# Patient Record
Sex: Female | Born: 1935 | Race: Black or African American | Hispanic: No | Smoking: Never smoker
Health system: Southern US, Community
[De-identification: ages and names within clinical notes are randomized; demographics above are authoritative.]

## PROBLEM LIST (undated history)

## (undated) DIAGNOSIS — J309 Allergic rhinitis, unspecified: Secondary | ICD-10-CM

## (undated) DIAGNOSIS — K5730 Diverticulosis of large intestine without perforation or abscess without bleeding: Secondary | ICD-10-CM

## (undated) DIAGNOSIS — K31819 Angiodysplasia of stomach and duodenum without bleeding: Secondary | ICD-10-CM

## (undated) DIAGNOSIS — H269 Unspecified cataract: Secondary | ICD-10-CM

## (undated) DIAGNOSIS — K589 Irritable bowel syndrome without diarrhea: Secondary | ICD-10-CM

## (undated) DIAGNOSIS — M1990 Unspecified osteoarthritis, unspecified site: Secondary | ICD-10-CM

## (undated) DIAGNOSIS — Z860100 Personal history of colon polyps, unspecified: Secondary | ICD-10-CM

## (undated) DIAGNOSIS — K449 Diaphragmatic hernia without obstruction or gangrene: Secondary | ICD-10-CM

## (undated) DIAGNOSIS — K222 Esophageal obstruction: Secondary | ICD-10-CM

## (undated) DIAGNOSIS — IMO0001 Reserved for inherently not codable concepts without codable children: Secondary | ICD-10-CM

## (undated) DIAGNOSIS — Z8701 Personal history of pneumonia (recurrent): Secondary | ICD-10-CM

## (undated) DIAGNOSIS — J45909 Unspecified asthma, uncomplicated: Secondary | ICD-10-CM

## (undated) DIAGNOSIS — Z8739 Personal history of other diseases of the musculoskeletal system and connective tissue: Secondary | ICD-10-CM

## (undated) DIAGNOSIS — Z79890 Hormone replacement therapy (postmenopausal): Secondary | ICD-10-CM

## (undated) DIAGNOSIS — Z9621 Cochlear implant status: Secondary | ICD-10-CM

## (undated) DIAGNOSIS — H9190 Unspecified hearing loss, unspecified ear: Secondary | ICD-10-CM

## (undated) DIAGNOSIS — K219 Gastro-esophageal reflux disease without esophagitis: Secondary | ICD-10-CM

## (undated) DIAGNOSIS — Z8679 Personal history of other diseases of the circulatory system: Secondary | ICD-10-CM

## (undated) DIAGNOSIS — T7840XA Allergy, unspecified, initial encounter: Secondary | ICD-10-CM

## (undated) HISTORY — PX: ESOPHAGOGASTRODUODENOSCOPY: SHX1529

## (undated) HISTORY — DX: Diaphragmatic hernia without obstruction or gangrene: K44.9

## (undated) HISTORY — PX: TUBAL LIGATION: SHX77

## (undated) HISTORY — DX: Unspecified hearing loss, unspecified ear: H91.90

## (undated) HISTORY — PX: OTHER SURGICAL HISTORY: SHX169

## (undated) HISTORY — DX: Allergy, unspecified, initial encounter: T78.40XA

## (undated) HISTORY — DX: Personal history of colon polyps, unspecified: Z86.0100

## (undated) HISTORY — DX: Gastro-esophageal reflux disease without esophagitis: K21.9

## (undated) HISTORY — DX: Unspecified cataract: H26.9

## (undated) HISTORY — DX: Unspecified asthma, uncomplicated: J45.909

## (undated) HISTORY — DX: Allergic rhinitis, unspecified: J30.9

## (undated) HISTORY — DX: Irritable bowel syndrome, unspecified: K58.9

## (undated) HISTORY — DX: Unspecified osteoarthritis, unspecified site: M19.90

## (undated) HISTORY — DX: Esophageal obstruction: K22.2

## (undated) HISTORY — PX: ABDOMINAL HYSTERECTOMY: SHX81

## (undated) HISTORY — DX: Personal history of pneumonia (recurrent): Z87.01

## (undated) HISTORY — PX: POLYPECTOMY: SHX149

## (undated) HISTORY — DX: Personal history of other diseases of the circulatory system: Z86.79

## (undated) HISTORY — DX: Cochlear implant status: Z96.21

## (undated) HISTORY — DX: Angiodysplasia of stomach and duodenum without bleeding: K31.819

## (undated) HISTORY — DX: Reserved for inherently not codable concepts without codable children: IMO0001

## (undated) HISTORY — DX: Diverticulosis of large intestine without perforation or abscess without bleeding: K57.30

## (undated) HISTORY — DX: Personal history of other diseases of the musculoskeletal system and connective tissue: Z87.39

## (undated) HISTORY — PX: CHOLECYSTECTOMY: SHX55

## (undated) HISTORY — PX: VENTRICULAR ABLATION SURGERY: SHX835

## (undated) HISTORY — PX: EYE SURGERY: SHX253

## (undated) HISTORY — DX: Personal history of colonic polyps: Z86.010

## (undated) HISTORY — DX: Hormone replacement therapy: Z79.890

## (undated) HISTORY — PX: COCHLEAR IMPLANT: SUR684

## (undated) HISTORY — PX: APPENDECTOMY: SHX54

---

## 1998-04-28 ENCOUNTER — Ambulatory Visit (HOSPITAL_COMMUNITY): Admission: RE | Admit: 1998-04-28 | Discharge: 1998-04-28 | Payer: Self-pay | Admitting: Otolaryngology

## 1998-12-06 ENCOUNTER — Encounter: Payer: Self-pay | Admitting: Internal Medicine

## 1998-12-06 ENCOUNTER — Ambulatory Visit (HOSPITAL_COMMUNITY): Admission: RE | Admit: 1998-12-06 | Discharge: 1998-12-06 | Payer: Self-pay | Admitting: Internal Medicine

## 1998-12-18 ENCOUNTER — Other Ambulatory Visit: Admission: RE | Admit: 1998-12-18 | Discharge: 1998-12-18 | Payer: Self-pay | Admitting: Internal Medicine

## 2000-03-17 ENCOUNTER — Encounter (INDEPENDENT_AMBULATORY_CARE_PROVIDER_SITE_OTHER): Payer: Self-pay | Admitting: Specialist

## 2000-03-17 ENCOUNTER — Other Ambulatory Visit: Admission: RE | Admit: 2000-03-17 | Discharge: 2000-03-17 | Payer: Self-pay | Admitting: Internal Medicine

## 2000-07-19 ENCOUNTER — Ambulatory Visit (HOSPITAL_COMMUNITY): Admission: RE | Admit: 2000-07-19 | Discharge: 2000-07-19 | Payer: Self-pay | Admitting: Internal Medicine

## 2000-07-19 ENCOUNTER — Encounter: Payer: Self-pay | Admitting: Internal Medicine

## 2000-09-08 ENCOUNTER — Ambulatory Visit (HOSPITAL_COMMUNITY): Admission: RE | Admit: 2000-09-08 | Discharge: 2000-09-08 | Payer: Self-pay | Admitting: Internal Medicine

## 2000-09-08 ENCOUNTER — Encounter: Payer: Self-pay | Admitting: Internal Medicine

## 2000-11-08 ENCOUNTER — Encounter: Payer: Self-pay | Admitting: Internal Medicine

## 2000-11-08 ENCOUNTER — Ambulatory Visit (HOSPITAL_COMMUNITY): Admission: RE | Admit: 2000-11-08 | Discharge: 2000-11-08 | Payer: Self-pay | Admitting: Internal Medicine

## 2000-11-15 ENCOUNTER — Encounter: Payer: Self-pay | Admitting: Internal Medicine

## 2000-11-15 ENCOUNTER — Ambulatory Visit (HOSPITAL_COMMUNITY): Admission: RE | Admit: 2000-11-15 | Discharge: 2000-11-15 | Payer: Self-pay | Admitting: Internal Medicine

## 2001-06-06 ENCOUNTER — Encounter: Payer: Self-pay | Admitting: Internal Medicine

## 2001-06-06 ENCOUNTER — Ambulatory Visit (HOSPITAL_COMMUNITY): Admission: RE | Admit: 2001-06-06 | Discharge: 2001-06-06 | Payer: Self-pay | Admitting: Internal Medicine

## 2002-08-05 ENCOUNTER — Encounter: Payer: Self-pay | Admitting: Emergency Medicine

## 2002-08-05 ENCOUNTER — Emergency Department (HOSPITAL_COMMUNITY): Admission: EM | Admit: 2002-08-05 | Discharge: 2002-08-05 | Payer: Self-pay | Admitting: Emergency Medicine

## 2002-10-16 ENCOUNTER — Encounter: Payer: Self-pay | Admitting: Internal Medicine

## 2002-10-16 ENCOUNTER — Ambulatory Visit (HOSPITAL_COMMUNITY): Admission: RE | Admit: 2002-10-16 | Discharge: 2002-10-16 | Payer: Self-pay | Admitting: Internal Medicine

## 2003-05-08 ENCOUNTER — Encounter: Payer: Self-pay | Admitting: *Deleted

## 2003-05-08 ENCOUNTER — Emergency Department (HOSPITAL_COMMUNITY): Admission: EM | Admit: 2003-05-08 | Discharge: 2003-05-08 | Payer: Self-pay | Admitting: *Deleted

## 2003-07-02 ENCOUNTER — Encounter: Payer: Self-pay | Admitting: Internal Medicine

## 2003-07-02 ENCOUNTER — Ambulatory Visit (HOSPITAL_COMMUNITY): Admission: RE | Admit: 2003-07-02 | Discharge: 2003-07-02 | Payer: Self-pay | Admitting: Internal Medicine

## 2003-09-14 ENCOUNTER — Emergency Department (HOSPITAL_COMMUNITY): Admission: EM | Admit: 2003-09-14 | Discharge: 2003-09-14 | Payer: Self-pay | Admitting: Emergency Medicine

## 2004-03-13 ENCOUNTER — Emergency Department (HOSPITAL_COMMUNITY): Admission: EM | Admit: 2004-03-13 | Discharge: 2004-03-13 | Payer: Self-pay | Admitting: Emergency Medicine

## 2004-05-01 ENCOUNTER — Ambulatory Visit (HOSPITAL_COMMUNITY): Admission: RE | Admit: 2004-05-01 | Discharge: 2004-05-02 | Payer: Self-pay | Admitting: Internal Medicine

## 2004-09-15 ENCOUNTER — Ambulatory Visit: Payer: Self-pay | Admitting: Internal Medicine

## 2004-12-29 ENCOUNTER — Ambulatory Visit: Payer: Self-pay | Admitting: Internal Medicine

## 2004-12-31 ENCOUNTER — Ambulatory Visit: Payer: Self-pay | Admitting: Internal Medicine

## 2004-12-31 ENCOUNTER — Ambulatory Visit (HOSPITAL_COMMUNITY): Admission: RE | Admit: 2004-12-31 | Discharge: 2004-12-31 | Payer: Self-pay | Admitting: Internal Medicine

## 2005-04-27 ENCOUNTER — Ambulatory Visit: Payer: Self-pay | Admitting: Internal Medicine

## 2005-05-27 ENCOUNTER — Emergency Department (HOSPITAL_COMMUNITY): Admission: EM | Admit: 2005-05-27 | Discharge: 2005-05-27 | Payer: Self-pay | Admitting: Emergency Medicine

## 2005-06-06 ENCOUNTER — Ambulatory Visit: Payer: Self-pay | Admitting: Internal Medicine

## 2005-12-21 ENCOUNTER — Ambulatory Visit: Payer: Self-pay | Admitting: Internal Medicine

## 2005-12-31 ENCOUNTER — Ambulatory Visit: Payer: Self-pay | Admitting: Internal Medicine

## 2005-12-31 ENCOUNTER — Ambulatory Visit (HOSPITAL_COMMUNITY): Admission: RE | Admit: 2005-12-31 | Discharge: 2005-12-31 | Payer: Self-pay | Admitting: Internal Medicine

## 2006-05-08 ENCOUNTER — Ambulatory Visit: Payer: Self-pay | Admitting: Internal Medicine

## 2006-09-16 ENCOUNTER — Ambulatory Visit: Payer: Self-pay | Admitting: Internal Medicine

## 2006-11-23 ENCOUNTER — Ambulatory Visit: Payer: Self-pay | Admitting: Internal Medicine

## 2006-11-24 LAB — CONVERTED CEMR LAB
BUN: 13 mg/dL (ref 6–23)
CO2: 31 meq/L (ref 19–32)
Calcium: 9.3 mg/dL (ref 8.4–10.5)
Chloride: 109 meq/L (ref 96–112)
Cholesterol: 204 mg/dL (ref 0–200)
Creatinine, Ser: 1.1 mg/dL (ref 0.4–1.2)
Direct LDL: 125.4 mg/dL
GFR calc Af Amer: 63 mL/min
GFR calc non Af Amer: 52 mL/min
Glucose, Bld: 91 mg/dL (ref 70–99)
HDL: 55.3 mg/dL (ref >39.0)
Potassium: 4.4 meq/L (ref 3.5–5.1)
Sodium: 143 meq/L (ref 135–145)
Total CHOL/HDL Ratio: 3.7
Triglycerides: 62 mg/dL (ref 0–149)
VLDL: 12 mg/dL (ref 0–40)

## 2007-08-17 ENCOUNTER — Ambulatory Visit (HOSPITAL_COMMUNITY): Admission: RE | Admit: 2007-08-17 | Discharge: 2007-08-17 | Payer: Self-pay | Admitting: Internal Medicine

## 2007-08-22 ENCOUNTER — Ambulatory Visit: Payer: Self-pay | Admitting: Internal Medicine

## 2007-09-27 ENCOUNTER — Encounter: Payer: Self-pay | Admitting: Internal Medicine

## 2007-12-23 ENCOUNTER — Encounter: Payer: Self-pay | Admitting: Internal Medicine

## 2007-12-30 ENCOUNTER — Encounter: Payer: Self-pay | Admitting: *Deleted

## 2007-12-30 DIAGNOSIS — J189 Pneumonia, unspecified organism: Secondary | ICD-10-CM | POA: Insufficient documentation

## 2007-12-30 DIAGNOSIS — Z9089 Acquired absence of other organs: Secondary | ICD-10-CM

## 2007-12-30 DIAGNOSIS — K319 Disease of stomach and duodenum, unspecified: Secondary | ICD-10-CM | POA: Insufficient documentation

## 2007-12-30 DIAGNOSIS — M67919 Unspecified disorder of synovium and tendon, unspecified shoulder: Secondary | ICD-10-CM | POA: Insufficient documentation

## 2007-12-30 DIAGNOSIS — M719 Bursopathy, unspecified: Secondary | ICD-10-CM

## 2007-12-30 DIAGNOSIS — Z9189 Other specified personal risk factors, not elsewhere classified: Secondary | ICD-10-CM | POA: Insufficient documentation

## 2007-12-30 DIAGNOSIS — K219 Gastro-esophageal reflux disease without esophagitis: Secondary | ICD-10-CM | POA: Insufficient documentation

## 2007-12-30 DIAGNOSIS — I471 Supraventricular tachycardia, unspecified: Secondary | ICD-10-CM | POA: Insufficient documentation

## 2007-12-30 DIAGNOSIS — Z9621 Cochlear implant status: Secondary | ICD-10-CM | POA: Insufficient documentation

## 2008-02-04 ENCOUNTER — Ambulatory Visit: Payer: Self-pay | Admitting: Family Medicine

## 2008-03-19 ENCOUNTER — Ambulatory Visit: Payer: Self-pay | Admitting: Internal Medicine

## 2008-03-19 LAB — CONVERTED CEMR LAB
ALT: 18 units/L (ref 0–35)
AST: 26 units/L (ref 0–37)
BUN: 8 mg/dL (ref 6–23)
Basophils Absolute: 0.2 10*3/uL — ABNORMAL HIGH (ref 0.0–0.1)
CO2: 31 meq/L (ref 19–32)
Creatinine, Ser: 0.9 mg/dL (ref 0.4–1.2)
Eosinophils Relative: 2.5 % (ref 0.0–5.0)
Folate: 11.6 ng/mL
GFR calc Af Amer: 80 mL/min
GFR calc non Af Amer: 66 mL/min
Hemoglobin: 13.8 g/dL (ref 12.0–15.0)
MCHC: 32.3 g/dL (ref 30.0–36.0)
Neutrophils Relative %: 54.3 % (ref 43.0–77.0)
Platelets: 260 10*3/uL (ref 150–400)
RDW: 13.5 % (ref 11.5–14.6)
Sodium: 143 meq/L (ref 135–145)
Total Bilirubin: 1.2 mg/dL (ref 0.3–1.2)

## 2008-03-20 ENCOUNTER — Encounter: Payer: Self-pay | Admitting: Internal Medicine

## 2008-06-13 ENCOUNTER — Ambulatory Visit: Payer: Self-pay | Admitting: Internal Medicine

## 2008-06-28 ENCOUNTER — Encounter: Payer: Self-pay | Admitting: Internal Medicine

## 2008-06-28 ENCOUNTER — Ambulatory Visit: Payer: Self-pay | Admitting: Internal Medicine

## 2008-06-29 ENCOUNTER — Encounter: Payer: Self-pay | Admitting: Internal Medicine

## 2008-10-17 ENCOUNTER — Ambulatory Visit: Payer: Self-pay | Admitting: Internal Medicine

## 2009-02-25 ENCOUNTER — Ambulatory Visit: Payer: Self-pay | Admitting: Endocrinology

## 2009-03-20 ENCOUNTER — Encounter: Payer: Self-pay | Admitting: Internal Medicine

## 2009-05-20 ENCOUNTER — Encounter: Payer: Self-pay | Admitting: Internal Medicine

## 2009-05-24 ENCOUNTER — Encounter: Payer: Self-pay | Admitting: Internal Medicine

## 2009-10-09 ENCOUNTER — Telehealth: Payer: Self-pay | Admitting: Internal Medicine

## 2009-10-09 ENCOUNTER — Ambulatory Visit: Payer: Self-pay | Admitting: Internal Medicine

## 2009-10-09 ENCOUNTER — Encounter: Payer: Self-pay | Admitting: Internal Medicine

## 2009-10-09 DIAGNOSIS — M8580 Other specified disorders of bone density and structure, unspecified site: Secondary | ICD-10-CM | POA: Insufficient documentation

## 2009-10-09 DIAGNOSIS — M949 Disorder of cartilage, unspecified: Secondary | ICD-10-CM

## 2009-10-09 DIAGNOSIS — M899 Disorder of bone, unspecified: Secondary | ICD-10-CM

## 2009-10-09 DIAGNOSIS — M545 Low back pain: Secondary | ICD-10-CM

## 2009-10-09 DIAGNOSIS — R82998 Other abnormal findings in urine: Secondary | ICD-10-CM

## 2009-10-09 LAB — CONVERTED CEMR LAB
Protein, U semiquant: NEGATIVE
pH: 7.5

## 2009-10-11 ENCOUNTER — Ambulatory Visit: Payer: Self-pay | Admitting: Internal Medicine

## 2009-10-11 ENCOUNTER — Encounter: Payer: Self-pay | Admitting: Internal Medicine

## 2009-11-04 ENCOUNTER — Ambulatory Visit: Payer: Self-pay | Admitting: Internal Medicine

## 2010-02-11 ENCOUNTER — Ambulatory Visit: Payer: Self-pay | Admitting: Internal Medicine

## 2010-02-11 ENCOUNTER — Inpatient Hospital Stay (HOSPITAL_COMMUNITY): Admission: EM | Admit: 2010-02-11 | Discharge: 2010-02-13 | Payer: Self-pay | Admitting: Emergency Medicine

## 2010-05-13 ENCOUNTER — Encounter: Payer: Self-pay | Admitting: Internal Medicine

## 2010-08-26 ENCOUNTER — Ambulatory Visit: Payer: Self-pay | Admitting: Internal Medicine

## 2010-08-26 ENCOUNTER — Encounter: Payer: Self-pay | Admitting: Internal Medicine

## 2010-12-11 NOTE — Assessment & Plan Note (Signed)
Summary: SLIGHT CHEST PAIN,PAIN IN RIGHT SHOULDER/MUSCLE PULLING IN RI...   Vital Signs:  Patient profile:   76 year old female Height:      65 inches Weight:      182 pounds BMI:     30.40 O2 Sat:      97 % on Room air Temp:     97.6 degrees F oral Pulse rate:   67 / minute BP sitting:   120 / 74  (left arm) Cuff size:   large  Vitals Entered By: Bill Salinas CMA (August 26, 2010 4:34 PM)  O2 Flow:  Room air CC: pt here with c/o slight chest pain with soreness more so towards her right side/ab   Primary Care Provider:  Jacques Navy MD  CC:  pt here with c/o slight chest pain with soreness more so towards her right side/ab.  History of Present Illness: Patient presents with several day h/o left anterior chest pain that is non-exertional. She describes a sharp pin-sticking type pain that comes and goes. No radiation to the jaw or arm. She has not had any diaphoresis, no shortness of breath. The pain does not keep her up at night. She dos have acid reflux  but this feels a little different.  Cardiac risk includes age-post menopausal, weight. Not a smoker, no hight cholesterol, no diabetes.   Current Medications (verified): 1)  Protonix 40 Mg  Tbec (Pantoprazole Sodium) .Marland Kitchen.. 1 By Mouth Once Daily 2)  Singulair 10 Mg  Tabs (Montelukast Sodium) .... Take One Tablet Once Daily 3)  Alprazolam 0.5 Mg  Tb24 (Alprazolam) .... Take One Tablet Once Daily As Needed 4)  Vesicare 10 Mg  Tabs (Solifenacin Succinate) .... Once Daily 5)  Advair Diskus 100-50 Mcg/dose  Misc (Fluticasone-Salmeterol) .Marland Kitchen.. 1 Inhalation Two Times A Day 6)  Astepro 137 Mcg/spray Soln (Azelastine Hcl) .... As Directed By Allergist 7)  Meloxicam 15 Mg Tabs (Meloxicam) .Marland Kitchen.. 1 By Mouth Once Daily  Allergies (verified): 1)  ! Bactrim  Past History:  Past Medical History: Last updated: 12/30/2007 * WATERMELON STOMACH Hx of PEPTIC STRICTURE (ICD-537.89) GERD (ICD-530.81) ALLERGIC RHINITIS, CHRONIC WITH ASTHMATIC  COMPONENT (ICD-477.9) HORMONE REPLACEMENT THERAPY (ICD-V07.4) Hx of PSVT (ICD-427.0) Hx of PNEUMONIA (ICD-486) POLYPS, COLON (ICD-211.3) HEARING IMPAIRMENT (ICD-389.9) Hx of BURSITIS, RIGHT SHOULDER (ICD-726.10)    Past Surgical History: Last updated: 03/19/2008 POLYPECTOMY, HX OF (ICD-V15.9) APPENDECTOMY, HX OF (ICD-V45.79) GERD (ICD-530.81) * URETHRAL DILATATIONS TUBAL LIGATION, HX OF (ICD-V26.51) * Hx of HYSTERECTOMY CHOLECYSTECTOMY, HX OF (ICD-V45.79) * ABLATION PROCEDURE FOR PAROXYSMAL VENTRICULAR TACHYCARDIA Great toe nails avulsed and ablated.  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  Review of Systems  The patient denies anorexia, weight loss, syncope, dyspnea on exertion, prolonged cough, abdominal pain, severe indigestion/heartburn, suspicious skin lesions, and angioedema.   CV:  See HPI; Denies bluish discoloration of lips or nails, difficulty breathing at night, difficulty breathing while lying down, fainting, fatigue, and palpitations.  Physical Exam  General:  obese AA female in no acute distress Head:  normocephalic and atraumatic.   Eyes:  C&S clear Neck:  supple.   Lungs:  normal respiratory  effort, normal breath sounds, no crackles, and no wheezes.   Heart:  normal rate, regular rhythm, no murmur, and no JVD.   Abdomen:  soft and non-tender.   Msk:  tender to palpation of the left anterior chest wall Pulses:  2+ radial Neurologic:  alert & oriented X3 and gait normal.   Skin:  turgor normal and color normal.     Impression & Recommendations:  Problem # 1:  CHEST PAIN, ATYPICAL  (ICD-786.59)  EKG without acute change. Palpable tenderness. Very unlikely to be anginal pain  Plan - lineament of choice along with heat to the sore area.   Orders: EKG w/ Interpretation (93000)  Complete Medication List: 1)  Protonix 40 Mg Tbec (Pantoprazole sodium) .Marland Kitchen.. 1 by mouth once daily 2)  Singulair 10 Mg Tabs (Montelukast sodium) .... Take one tablet once daily 3)  Alprazolam 0.5 Mg Tb24 (Alprazolam) .... Take one tablet once daily as needed 4)  Vesicare 10 Mg Tabs (Solifenacin succinate) .... Once daily 5)  Advair Diskus 100-50 Mcg/dose Misc (Fluticasone-salmeterol) .Marland Kitchen.. 1 inhalation two times a day 6)  Astepro 137 Mcg/spray Soln (Azelastine hcl) .... As directed by allergist 7)  Meloxicam 15 Mg Tabs (Meloxicam) .Marland Kitchen.. 1 by mouth once daily Prescriptions: ALPRAZOLAM 0.5 MG  TB24 (ALPRAZOLAM) Take one tablet once daily as needed  #45 x 5   Entered and Authorized by:   Jacques Navy MD   Signed by:   Jacques Navy MD on 08/26/2010   Method used:   Handwritten   RxID:   [id number] VESICARE 10 MG  TABS (SOLIFENACIN SUCCINATE) once daily  #30 x 12   Entered and Authorized by:   Jacques Navy MD   Signed by:   Jacques Navy MD on 08/26/2010   Method used:   Electronically to        [facility]  [address] [id number]* (retail)       [address]       [place], [place]  [address]       Ph: [phone] or [phone]       Fax: [phone]   RxID:   [id number] SINGULAIR 10 MG  TABS (MONTELUKAST SODIUM) Take one tablet once daily  #30 x 12   Entered and Authorized by:   Jacques Navy MD   Signed by:   Jacques Navy MD on 08/26/2010   Method used:   Electronically to        [facility]  [address] [id number]* (retail)       [address]       [place], [place]  [address]       Ph: [phone] or [phone]       Fax: [phone]   RxID:   [id number] PROTONIX 40 MG  TBEC (PANTOPRAZOLE SODIUM) 1 by mouth once  daily  #30 x 12   Entered and Authorized by:   Jacques Navy MD   Signed by:   Jacques Navy MD on 08/26/2010   Method used:   Electronically to        [facility]  [address] [facility]* (retail)       [address]       [place], [place]  [address]       Ph: [phone] or [phone]  Fax: [phone]   RxID:   [id number]    Orders Added: 1)  EKG w/ Interpretation [93000] 2)  Est. Patient Level III [30865]

## 2011-01-28 LAB — DIFFERENTIAL
Basophils Absolute: 0 10*3/uL (ref 0.0–0.1)
Eosinophils Absolute: 0.2 10*3/uL (ref 0.0–0.7)
Eosinophils Relative: 4 % (ref 0–5)
Lymphs Abs: 2.2 10*3/uL (ref 0.7–4.0)
Monocytes Relative: 10 % (ref 3–12)
Neutro Abs: 3.8 10*3/uL (ref 1.7–7.7)

## 2011-01-28 LAB — BASIC METABOLIC PANEL
CO2: 27 mEq/L (ref 19–32)
Calcium: 8.3 mg/dL — ABNORMAL LOW (ref 8.4–10.5)
Creatinine, Ser: 1.13 mg/dL (ref 0.4–1.2)
Glucose, Bld: 108 mg/dL — ABNORMAL HIGH (ref 70–99)

## 2011-01-28 LAB — COMPREHENSIVE METABOLIC PANEL
ALT: 18 U/L (ref 0–35)
AST: 44 U/L — ABNORMAL HIGH (ref 0–37)
Alkaline Phosphatase: 69 U/L (ref 39–117)
BUN: 17 mg/dL (ref 6–23)
Chloride: 107 mEq/L (ref 96–112)
GFR calc non Af Amer: 53 mL/min — ABNORMAL LOW (ref >60)
Total Bilirubin: 1.7 mg/dL — ABNORMAL HIGH (ref 0.3–1.2)

## 2011-01-28 LAB — CBC
HCT: 43.3 % (ref 36.0–46.0)
Hemoglobin: 14.2 g/dL (ref 12.0–15.0)
MCHC: 32.8 g/dL (ref 30.0–36.0)
Platelets: 209 10*3/uL (ref 150–400)
RBC: 4.5 MIL/uL (ref 3.87–5.11)
RBC: 5.26 MIL/uL — ABNORMAL HIGH (ref 3.87–5.11)
RDW: 13.8 % (ref 11.5–15.5)
WBC: 4.2 10*3/uL (ref 4.0–10.5)
WBC: 6.9 10*3/uL (ref 4.0–10.5)

## 2011-01-28 LAB — LACTIC ACID, PLASMA: Lactic Acid, Venous: 0.9 mmol/L (ref 0.5–2.2)

## 2011-03-10 ENCOUNTER — Ambulatory Visit: Payer: Self-pay | Admitting: Internal Medicine

## 2011-03-20 ENCOUNTER — Ambulatory Visit: Payer: Self-pay | Admitting: Internal Medicine

## 2011-03-23 ENCOUNTER — Ambulatory Visit (INDEPENDENT_AMBULATORY_CARE_PROVIDER_SITE_OTHER): Payer: Medicare Other | Admitting: Internal Medicine

## 2011-03-23 VITALS — BP 138/80 | HR 67 | Temp 97.8°F | Wt 186.0 lb

## 2011-03-23 DIAGNOSIS — K649 Unspecified hemorrhoids: Secondary | ICD-10-CM

## 2011-03-23 DIAGNOSIS — M7551 Bursitis of right shoulder: Secondary | ICD-10-CM

## 2011-03-23 DIAGNOSIS — M719 Bursopathy, unspecified: Secondary | ICD-10-CM

## 2011-03-23 DIAGNOSIS — M67919 Unspecified disorder of synovium and tendon, unspecified shoulder: Secondary | ICD-10-CM

## 2011-03-23 MED ORDER — PANTOPRAZOLE SODIUM 40 MG PO TBEC: 40.0000 mg | DELAYED_RELEASE_TABLET | Freq: Every day | ORAL | Status: DC

## 2011-03-23 MED ORDER — ALPRAZOLAM 0.5 MG PO TABS: 0.5000 mg | ORAL_TABLET | Freq: Every evening | ORAL | Status: DC | PRN

## 2011-03-23 MED ORDER — SOLIFENACIN SUCCINATE 10 MG PO TABS: 5.0000 mg | ORAL_TABLET | Freq: Every day | ORAL | Status: DC

## 2011-03-23 MED ORDER — AZELASTINE HCL 0.1 % NA SOLN: 1.0000 | Freq: Two times a day (BID) | NASAL | Status: DC

## 2011-03-24 MED ORDER — METHYLPREDNISOLONE ACETATE 80 MG/ML IJ SUSP: 40.0000 mg | Freq: Once | INTRAMUSCULAR | Status: DC

## 2011-03-24 NOTE — Progress Notes (Signed)
  Subjective:    Patient ID: Emily Bentley, female    DOB: 09/09/1935, 76 y.o.   MRN: [id number]  HPI Emily Bentley present for several episodes of hematochezia. She had a colonoscopy in '09 with a adenomatous polyp. She has a prior history of hemorrhoids. She reports that several days ago she had bright red blood at the time of a BM following constipation. She had hematochezia with several succeeding BM's in diminishing amounts and for the past several days there has been no blood with BM.  She is c/o pain in the right proximal UE and shoulder. There is preserved ROM although painful. She denies any injury or strain.   PMH, FamHx and SocHx reviewed for any changes and relevance.     Review of Systems 10 point review of symptoms is negative    Objective:   Physical Exam Well nourished AA woman in no distress HEENT C&S clear Pul - normal respirations Cor- RRR Ext- right shoulder with normal ROM: extensio,flexion, adduction, abduction and rotation but there is discomfort at the extremes of range. No click and no crepitus. No inflammation at elbow, wrist or hand.       Assessment & Plan:  1. Hematochezia - by history and chart review her symptoms are consistent with recurrent hemorrhoids.  Plan - instructed in hemorrhoid prevention including use of regular laxative, e.g. MOM  2. Shoulder pain - exam and history most c/w right shoulder bursitis.  Plan - steroid injection.  Procedure Joint/bursal injection  Indication - localized pain right shoulder Consent - informed verbal consent from patient after explanation of risks of bleeding and infection Prep - injection site identified - lateral aspect right shouder, prepped with betadine followed by alcohol. Med -  40 Mg depomedrol with 0.5 Cc 2% xylocain Injection - bursa/joint space entered easily. Injected without difficulty. Patient tolerated this well. Post-procedure - patient with rapid reduction in discomfort. Bandaid applied.  Routine precautions provided including instruction to return for fever, drainage or increased pain

## 2011-03-26 ENCOUNTER — Telehealth: Payer: Self-pay | Admitting: *Deleted

## 2011-03-26 NOTE — Telephone Encounter (Signed)
PA requested for Pantoprazole 40mg  tab PA form requested from [facility] Employee @ [phone] [03/23/11] Prior authorization obtained via telephone.Marland KitchenPharmacy informed. [03/23/11]  Official authorization letter recvd & faxed to pharmacy for file [03/25/11]

## 2011-03-27 NOTE — Procedures (Signed)
[facility]  Patient:    Emily Bentley, Emily Bentley                MRN: [id number] Proc. Date: 07/19/00 Adm. Date:  [id number] Attending:  Estella Husk CC:         Rosalyn Gess Norins, M.D. [facility]                           Procedure Report  PROCEDURE:  Esophagogastroduodenoscopy with Savary (guidewire) dilation of the esophagus with fluoroscopic assistance.  INDICATION:  Dysphagia.  HISTORY OF PRESENT ILLNESS:  This is a 76 year old female who was recently evaluated in the office for nausea, vomiting, epigastric discomfort, and dysphagia.  She is known to have reflux disease complicated by peptic stricture.  She was felt to have had exacerbation of the same after going off medication.  She was placed on Nexium 40 mg daily.  Symptoms have improved, though incompletely.  She is now for upper endoscopy with esophageal dilatation.  INFORMED CONSENT:  The nature of the procedure as well as the risks, benefits and alternatives were reviewed.  She understood and agreed to proceed.  PHYSICAL EXAMINATION:  GENERAL:  Well-appearing female in no acute distress.  She is alert and oriented.  VITAL SIGNS:  Stable.  LUNGS:  Clear.  HEART:  Regular.  ABDOMEN:  Soft.  DESCRIPTION OF PROCEDURE:  After informed consent was obtained, the patient was sedated with 80 mg of Demerol and 7.5 mg of Versed IV.  The Olympus endoscope was passed orally under direct vision into the esophagus.  The esophagus revealed a dense, fibrous stricture at the gastroesophageal junction.  This was located at 35 cm from the incisors.  No active esophagitis, Barretts esophagus, or mass.  The stomach revealed a sliding hiatal hernia but was otherwise normal.  The duodenal bulb and the postbulbar duodenum as well as the major ampulla were normal.  THERAPY:  After completing the endoscopic survey, a Savary guidewire was placed into the gastric antrum under fluoroscopic control.  The  endoscope was removed with the guidewire maintained in position.  Subsequently 16, 17, 18, and 19 mm dilators were passed sequentially over the guidewire.  Fluoroscopy was utilized throughout each portion of each dilation.  No resistance encountered with the passage of any dilator.  No blood was present upon removal of any dilator.  The patient tolerated the procedure well.  IMPRESSION:  Gastroesophageal reflux disease complicated by peptic stricture, status post Savary dilation to a maximal diameter of 19 mm.  RECOMMENDATIONS: 1. NPO for two hours and then clear liquids for two hours, then soft diet    until a.m. 2. Continue Nexium. 3. The patient should call the office for follow-up visit in approximately    four to six weeks. DD:  07/19/00 TD:  07/20/00 Job: [id number]

## 2011-03-27 NOTE — Assessment & Plan Note (Signed)
[facility]                           PRIMARY CARE OFFICE NOTE   MARYFRANCES, PORTUGAL                      MRN:          [id number]  DATE:11/23/2006                            DOB:          [birth year]    Ms. Doyel presents for follow-up evaluation and examination.  She was  last seen in the office on September 16, 2006, for bursitis in the right  shoulder and had a steroid injection.  She reports she had some relief  of her discomfort, but continues to have some mild pain.   The patient is also having some problems with symptoms suggestive of  urethral stricture.  She has had dilatations in the past and has been  referred to Windy Fast L. Earlene Plater, M.D. with an approximately on December 07, 2006.   The patient's last full physical was September 15, 2004, and in the  interval she has been seen by gastroenterology on December 29, 2004, for  dysphagia.  She did undergo dilatation on December 31, 2004, and had a  repeat study December 31, 2005.  The patient has had recurrent shoulder  problems.  She has had recurrent episodes of flares of her allergies.  She has had an upper respiratory tract infection.   PAST SURGICAL HISTORY:  1. Cholecystectomy with incidental appendectomy.  2. Hysterectomy with tubal ligation.  3. Ablation procedure for paroxysmal ventricular tachycardia.  4. Urethral dilatations.   PAST MEDICAL HISTORY:  1. Usual childhood diseases.  2. Hearing impairment.  3. Colon polyps with polypectomies.  4. Watermelon stomach.  5. History of pneumonia in the past.  6. PSVT status post ablation as noted.  7. Hormone replacement therapy.  8. Significant allergic rhinitis with asthmatic component.   FAMILY HISTORY:   SOCIAL HISTORY:  Remain unchanged.   CURRENT MEDICATIONS:  1. Singulair 10 mg daily.  2. Zyrtec 10 mg daily.  3. Nasacort spray daily.  4. Alprazolam 0.5 mg daily p.r.n.  5. Ditropan 5 mg daily.  6. Protonix 40 mg daily.  7. Nabumetone 1 gram q.h.s.  8. Caltrate with D daily.  9. Advair 100/50 one inhalation a.m. and h.s. taken irregularly.  10.Albuterol inhaler p.r.n.   PHYSICIAN ROSTER:  1. John N. Marina Goodell, M.D. for GI.  2. Kerry Kass, M.D. [facility] for allergies.  3. Lucrezia Starch. Earlene Plater, M.D. for urology.   CHART REVIEW:  Last upper endoscopy on December 31, 2005.  Last  colonoscopy on June 26, 2003.  Last EP visit on June 03, 2004, with  Doylene Canning. Ladona Ridgel, M.D.  Last nuclear study was back in 1995 and was a  normal study.  Last two-dimensional echocardiogram with Dopplers from  1995.  Last office note from Dr. Eileen Stanford at [facility] on  December 11, 2005.  The patient was advised to restart her Advair one  puff b.i.d.  Last mammogram from November 25, 2005, which was a negative  study.  Last limited CT sinus on July 24, 2002, with mucosal  straining in the left sphenoid sinus, middle turbinate enlargement  bilaterally.  MRI of the cervical spine in 2001  which showed multilevel  spondylosis with no evidence of cord compression.   REVIEW OF SYSTEMS:  Negative for any constitutional, cardiovascular,  respiratory, GI, or GU active complaints at this time.   PHYSICAL EXAMINATION:  VITAL SIGNS:  Temperature 97.1, blood pressure  134/82, pulse 68, weight 183.  GENERAL:  This is a heavyset African-American woman who looks her stated  age in no acute distress.  HEENT:  Normocephalic and atraumatic.  EAC's and TM's were unremarkable.  The patient has an upper denture.  She is missing her molar and  premolars on the mandible.  No buccal lesions were noted.  Posterior  pharynx was clear.  Conjunctivae and sclerae were clear.  Pupils equal,  round, and reactive to light and accommodation.  Funduscopic examination  was unremarkable.  NECK:  Supple without thyromegaly.  NODES:  No adenopathy was noted in the cervical or supraclavicular  regions.  CHEST:  No CVA tenderness.  LUNGS:  Clear with no  rales, wheezes, or rhonchi on today's examination.  BREASTS:  Breasts were pendulous, skin was normal, and nipples without  discharge.  No fixed mass, lesion, or abnormality was appreciated.  She  does have minor fibrocystic type changes.  CARDIOVASCULAR:  2+ radial pulse, no JVD, or carotid bruits.  She had a  quiet precordium with regular rate and rhythm without murmurs, rubs, or  gallops.  ABDOMEN:  Soft with no guarding or rebound.  No organosplenomegaly was  noted.  PELVIC:  RECTAL:  Deferred.  EXTREMITIES:  Without cyanosis, clubbing, or edema.  No deformities were  noted.  SKIN:  The patient has a 1 cm mole left anterior chest wall which  appears benign with a well circumscribed border.  NEUROLOGY:  Nonfocal.   12-lead electrocardiogram revealed normal sinus rhythm and appeared to a  normal EKG.   LABORATORY DATA:  There are no laboratories available, the patient will  be getting labs today.   ASSESSMENT/PLAN:  1. Pulmonary/allergy.  The patient seems to have her symptoms under      control at this time. She has been using Advair p.r.n. and      albuterol on a regular basis.  I discussed this with the patient      and instructed her to do her Advair on a daily basis twice a day      and to use albuterol for break through only.  2. Gastrointestinal.  The patient is stable at this time with no      recurrent dysphagia.  3. Genitourinary.  The patient is scheduled to Dr. Darvin Neighbours on      January 29, as noted.  4. Arthritis.  The patient is stable and doing well with her      nabumetone.  She still has some mild shoulder discomfort, but she      is not limited in her activities of daily living.  5. Health maintenance.  The patient is current and up-to-date with      colorectal cancer screening and breast examinations.   SUMMARY:  This is a very pleasant woman who seems to be medically stable  at this time with problems as outlined above.  She is asked to return to see me  in 1 year or on a p.r.n. basis.     Rosalyn Gess Norins, MD  Electronically Signed    MEN/MedQ  DD: 11/24/2006  DT: 11/24/2006  Job #: [id number]   cc:   Waymon Amato. Earlene Plater, M.D.  Kerry Kass, M.D. [facility]

## 2011-03-27 NOTE — Op Note (Signed)
NAME:  Emily Bentley, Emily Bentley                         ACCOUNT NO.:  [id number]   MEDICAL RECORD NO.:  [id number]                   PATIENT TYPE:  OIB   LOCATION:  4731                                 FACILITY:  [facility]   PHYSICIAN:  Doylene Canning. Ladona Ridgel, M.D.               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   INDICATIONS FOR PROCEDURE:  Recurrent symptomatic SVT despite medical  therapy.   I:  INTRODUCTION:  The patient is a very pleasant 76 year old woman with a  several year history of tachy palpitations and documented SVT.  Despite  multiple medications, she has continued to have symptoms and is now referred  for electrophysiologic study and catheter ablation.   II:  PROCEDURE:  After informed consent was obtained, the patient was taken  to the diagnostic EP lab in a fasting state.  After the usual preparation  and draping, intravenous Fentanyl and Midazolam were given for sedation.  A  6 French hexapolar catheter was inserted percutaneously in the right jugular  vein and advanced to the coronary sinus.  A 5 French quadripolar catheter  was inserted percutaneously in the right femoral vein and advanced to the RV  apex.  A 5 French quadripolar catheter was inserted percutaneously in the  right femoral vein and advanced to the His bundle region.  A 7 French  quadripolar catheter was inserted percutaneously in the right femoral vein  and advanced to the right atrium.  After measurement of the basic intervals,  rapid ventricular pacing was carried out from the RV apex at a pacing cycle  length of 600 milliseconds and step wise decreased down to 420 milliseconds  where VA Wenckebach was observed.  During rapid ventricular pacing, the  atrial activation was midline and decremental.  Next,  programmed ventricular  stimulation was carried out from the RV apex at paced cycle length of 500  and 600 milliseconds.  This was step wise decreased from 540 milliseconds  down to 260 milliseconds where ventricular refractory was observed.  During  programmed ventricular stimulation, the atrial activation sequence was  midline and decremental.  Next, programmed atrial stimulation was carried  out from the coronary sinus as well as from the high right atrium with paced  cycle lengths of 600 and 500 milliseconds.  The S1 and S2 interval was step  wise decreased down to the AV node ERP at 250 milliseconds.  During  programmed atrial stimulation, there were multiple AH jumps and echo beats  but initially no inducible SVT.  Next, rapid atrial pacing was carried out  from the coronary sinus as well as the high right atrium at pacing cycle  length of 690 milliseconds and step wise decreased down to 360  milliseconds  where AV Wenckebach was observed.  During rapid atrial pacing, the PR  interval was greater than the RR interval, but there was no inducible SVT.  Isoproterenol was administered at 1 mcg per minute.  Additional atrial  stimulation was carried out from the coronary sinus at a base cycle length  of 400 milliseconds.  The S1 and S2 interval was step wise decreased and  this resulted in the initiation of SVT.  This was a narrow QRS tachycardia  at a cycle length of 380 milliseconds.  PVCs placed at the time of His  bundle refractory did not pre-excite the atrium.  In addition, ventricular  pacing resulted in termination of the tachycardia.  Mapping of the  tachycardia demonstrated a very short VA interval, all of the following  consistent with AV node re-entrant tachycardia.  At this point, the ablation  catheter was maneuvered from the right atrium into the region of Koch's  triangle sites 7 through 9.  Five RF energy applications were then delivered  which resulted in the accelerated  junctional rhythm.  Following catheter  ablation, additional rapid atrial pacing and programmed atrial stimulation  was carried out both with and without isoproterenol.  There was no inducible  SVT.  20 minutes was allowed to elapse and the patient was observed, but  again, had no inducible SVT.  In addition, there was no slow pathway  conduction present following catheter ablation.  The catheter was then  removed, hemostasis was assured, and the patient was returned to her room in  satisfactory condition.   III:  COMPLICATIONS:  There were no immediate procedure complications.   IV:  RESULTS:  A.  Baseline ECG.  The baseline ECG demonstrates normal sinus rhythm with  normal axis and intervals.  B.  Baseline intervals.  The sinus node cycle length was 763 milliseconds.  The PR interval was 156 milliseconds.  The QRS duration was 60 milliseconds.  The HV interval was 51 milliseconds.  C.  Rapid ventricular pacing.  Rapid ventricular pacing was carried out from  the RV apex at a pacing cycle length of 600 milliseconds and step wise  decreased down to 420 milliseconds where VA Wenckebach was observed.  During  rapid ventricular pacing, the atrial activation was midline and decremental.  D.  Programmed ventricular stimulation.  Programmed ventricular stimulation  was carried out from the RV apex at a pacing cycle length of 600  milliseconds.  The S1 and S2 interval was stepwise decreased from 540  milliseconds down to 260 milliseconds where ventricular refractory was  observed.  During programmed ventricular stimulation, the atrial activation  was midline and decremental.  E.  Rapid atrial pacing.  Rapid atrial pacing from both the coronary sinus  as well as the high right atrium at pacing cycle lengths of 600 milliseconds  and stepwise decreased down to 360 milliseconds.  This resulted in AV  Wenckebach cycle length.  Following initiation of isoproterenol, additional rapid atrial pacing  was carried out following catheter ablation.  There was  no inducible SVT following catheter ablation.  F.  Programmed atrial stimulation.  Programmed atrial stimulation was  carried out from the coronary sinus as well as the high right atrium at  paced cycle lengths of 600, 500, and 400 milliseconds.  The S1 and S2  interval was stepwise decreased down to 200 milliseconds resulting in atrial  refractory.  At a S1 and S2 coupling interval of 400/220, there was  inducible SVT.  G.  Arrhythmias observed:  1. AV node re-entrant tachycardia initiation was with programmed atrial     stimulation on isoproterenol, duration was sustained, termination was     with ventricular pacing.  Cycle length was 379 milliseconds.     A. Mapping:  Mapping of the Koch's triangle demonstrated the usual size        and orientation.        A. RF energy application.  A total of five RF energy applications were           delivered to sites 7 through 9 Koch's triangle.  During the RF           energy application, there was a prolonged accelerated junctional           rhythm.   V:  CONCLUSION:  The study demonstrates successful electrophysiologic study  and RF catheter ablation of AV node re-entrant tachycardia with a total of  five RF energy applications delivered to the slow pathway resulting in  rendering the tachycardia not inducible and resulting in no additional slow  pathway conduction.                                               Doylene Canning. Ladona Ridgel, M.D.    GWT/MEDQ  D:  05/01/2004  T:  05/02/2004  Job:  [id number]   cc:   Rosalyn Gess. Norins, M.D. [facility]

## 2011-03-27 NOTE — Discharge Summary (Signed)
NAME:  Emily Bentley, Emily Bentley                         ACCOUNT NO.:  [id number]   MEDICAL RECORD NO.:  [id number]                   PATIENT TYPE:  OIB   LOCATION:  4731                                 FACILITY:  [facility]   PHYSICIAN:  Doylene Canning. Ladona Ridgel, M.D.               DATE OF BIRTH:  [birth year]   DATE OF ADMISSION:  05/01/2004  DATE OF DISCHARGE:  05/02/2004                                 DISCHARGE SUMMARY   PRIMARY CARE PHYSICIAN:  Supraventricular tachycardia .   HISTORY OF PRESENT ILLNESS:  The patient is a 76 year old female with past  medical history of SVT, no other cardiac disease, who has had episode of  progressively worsening SVT over the past two months.  The patient was  admitted and underwent ablation of inducible A-V nodal reentrance  tachycardia, successful slow pathway modification.  The patient had no  postop complications.  The patient was discharged to home off calcium  blockers and was to follow up with Dr. Lorenz Coaster in three weeks. The patient  was discharged to home on the follow medications.   DISCHARGE MEDICATIONS:  1. Premarin 0.625 daily.  2. Prilosec 30 daily.  3. Hyoscyamine 0.125 daily.  4. Singulair 10 daily.  5. Zyrtec 10 daily.  6. Citropam 5 daily, 1 to 2 times every 4 to 6 hours as needed.   No heavy lifting or strenuous activity for four days, no driving for two.  Call if develop any lump or drainage from her groin or temperature above  101, and she was to follow with Dr. Lewayne Bunting July 26 at 10 a.m.   Also of note, the patient had been complaining of left shoulder discomfort  and left chest discomfort which occurs sometimes at night, usually relieved  with Tylenol.      Chinita Pester, C.R.N.P. [facility]                 Doylene Canning. Ladona Ridgel, M.D.    DS/MEDQ  D:  05/02/2004  T:  05/03/2004  Job:  [id number]

## 2011-03-27 NOTE — Procedures (Signed)
[facility]  Patient:    Emily Bentley, Emily Bentley                MRN: [id number] Proc. Date: 07/19/00 Adm. Date:  [id number] Disc. Date: [id number] Attending:  Estella Husk CC:         Rosalyn Gess Norins, M.D. [facility]   Procedure Report  PROCEDURE:  Esophagogastroduodenoscopy with Savary (guidewire) dilation of the esophagus with fluoroscopic assistance.  INDICATION:  Dysphagia.  HISTORY OF PRESENT ILLNESS:  This is a 76 year old female who was recently evaluated in the office for nausea, vomiting, epigastric discomfort, and dysphagia.  She is known to have reflux disease complicated by peptic stricture.  She was felt to have had exacerbation of the same after going off medication.  She was placed on Nexium 40 mg daily.  Symptoms have improved, though incompletely.  She is now for upper endoscopy with esophageal dilatation.  INFORMED CONSENT:  The nature of the procedure as well as the risks, benefits and alternatives have been reviewed.  She understood and agreed to proceed.  PHYSICAL EXAMINATION:  GENERAL:  Well-appearing female in no acute distress.  She is alert and oriented.  VITAL SIGNS:  Stable.  LUNGS:  Clear.  HEART:  Regular.  ABDOMEN:  Soft.  DESCRIPTION OF PROCEDURE:  After informed consent was obtained, the patient was sedated with 80 mg of Demerol and 7.5 mg of Versed IV.  The Olympus endoscope was passed orally under direct vision into the esophagus.  The esophagus revealed a dense, fibrous stricture at the gastroesophageal junction.  This was located at 35 cm from the incisors.  No active esophagitis, Barretts esophagus, or mass.  Stomach revealed a sliding hiatal hernia but was otherwise normal.  The duodenal bulb and the postbulbar duodenum as well as the major ampulla were normal.  THERAPY:  After completing the endoscopic survey, a Savary guidewire was placed into the gastric antrum under fluoroscopic control.  The  endoscope was removed with the guidewire maintained in position.  Subsequently 16, 17, 18, and 19 mm dilators were passed sequentially over the guidewire.  Fluoroscopy was utilized throughout each portion of each dilation.  No resistance encountered with the passage of any dilator.  No blood was present upon the removal of any dilator.  The patient tolerated the procedure well.  IMPRESSION:  Gastroesophageal reflux disease complicated by peptic stricture, status post Savary dilation to a maximum diameter of 19 mm.  RECOMMENDATIONS: 1. NPO for two hours and then clear liquids for two hours, then soft diet    until a.m. 2. Continue Nexium. 3. The patient should call the office for a follow-up visit in approximately    4-6 weeks. DD:  07/19/00 TD:  07/20/00 Job: [id number]

## 2011-10-08 ENCOUNTER — Encounter: Payer: Self-pay | Admitting: Internal Medicine

## 2011-10-20 ENCOUNTER — Encounter: Payer: Self-pay | Admitting: Internal Medicine

## 2011-10-30 ENCOUNTER — Encounter: Payer: Self-pay | Admitting: Internal Medicine

## 2011-10-30 ENCOUNTER — Ambulatory Visit (INDEPENDENT_AMBULATORY_CARE_PROVIDER_SITE_OTHER): Payer: Medicare Other | Admitting: Internal Medicine

## 2011-10-30 ENCOUNTER — Other Ambulatory Visit (INDEPENDENT_AMBULATORY_CARE_PROVIDER_SITE_OTHER): Payer: Medicare Other

## 2011-10-30 VITALS — BP 124/80 | HR 68 | Ht 65.0 in | Wt 181.0 lb

## 2011-10-30 DIAGNOSIS — R141 Gas pain: Secondary | ICD-10-CM

## 2011-10-30 DIAGNOSIS — R142 Eructation: Secondary | ICD-10-CM

## 2011-10-30 DIAGNOSIS — K5900 Constipation, unspecified: Secondary | ICD-10-CM

## 2011-10-30 DIAGNOSIS — R143 Flatulence: Secondary | ICD-10-CM

## 2011-10-30 DIAGNOSIS — K222 Esophageal obstruction: Secondary | ICD-10-CM

## 2011-10-30 DIAGNOSIS — R1084 Generalized abdominal pain: Secondary | ICD-10-CM

## 2011-10-30 DIAGNOSIS — K219 Gastro-esophageal reflux disease without esophagitis: Secondary | ICD-10-CM

## 2011-10-30 DIAGNOSIS — R634 Abnormal weight loss: Secondary | ICD-10-CM

## 2011-10-30 LAB — BASIC METABOLIC PANEL
BUN: 18 mg/dL (ref 6–23)
Calcium: 9 mg/dL (ref 8.4–10.5)
GFR: 69.7 mL/min (ref >60.00)
Glucose, Bld: 89 mg/dL (ref 70–99)
Sodium: 142 mEq/L (ref 135–145)

## 2011-10-30 MED ORDER — ALIGN PO CAPS: 1.0000 | ORAL_CAPSULE | Freq: Every day | ORAL | Status: DC

## 2011-10-30 NOTE — Patient Instructions (Addendum)
Your physician has requested that you go to the basement for the following lab work before leaving today:   You have been scheduled for a CT scan of the abdomen and pelvis at [facility] ([address]---this is in the same building as [facility]).   You are scheduled on 11-05-11 at 1:00pm. You should arrive 15 minutes prior to your appointment time for registration. Please follow the written instructions below on the day of your exam:  WARNING: IF YOU ARE ALLERGIC TO IODINE/X-RAY DYE, PLEASE NOTIFY RADIOLOGY IMMEDIATELY AT [phone]! YOU WILL BE GIVEN A 13 HOUR PREMEDICATION PREP.  1) Do not eat or drink anything after 9:00am (4 hours prior to your test) 2) You have been given 2 bottles of oral contrast to drink. The solution may taste better if refrigerated, but do NOT add ice or any other liquid to this solution. Shake well before drinking.    Drink 1 bottle of contrast @ 11:00am (2 hours prior to your exam)  Drink 1 bottle of contrast @ 12:00pm (1 hour prior to your exam)   The purpose of you drinking the oral contrast is to aid in the visualization of your intestinal tract. The contrast solution may cause some diarrhea. Before your exam is started, you will be given a small amount of fluid to drink. Depending on your individual set of symptoms, you may also receive an intravenous injection of x-ray contrast/dye. Plan on being at [facility] for 30 minutes or long, depending on the type of exam you are having performed.  If you have any questions regarding your exam or if you need to reschedule, you may call the CT department at [phone] between the hours of 8:00 am and 5:00 pm, Monday-Friday.  We have given you samples of Align. This puts good bacteria back into your intestines. You should take 1 capsule by mouth once daily. If this works well for you, it can be purchased over the counter.  You may try Glycolax, over the counter, 1 scoop in 8 ounces of  water daily  You have also been been given some information on gas and an anti-gas diet.  Please follow up in 4-6-weeks      ________________________________________________________________________

## 2011-10-30 NOTE — Progress Notes (Signed)
HISTORY OF PRESENT ILLNESS:  Emily Bentley is a 76 y.o. female with multiple medical problems as listed below. She presents today regarding multiple, complaints. She has been followed in this office for gastroesophageal reflux disease complicated by peptic stricture which has required esophageal dilation. As well, adenomatous colon polyps. Her chief complaint today is that of postprandial abdominal bloating discomfort. Problem has been present since April 2011 (when she was hospitalized with what sounds like a small bowel obstruction that resolved with nonsurgical therapy). Discomfort has worsened with time. She cannot identify anything that improves her symptoms. Obviously, worse after meals as stated as well as with constipation. She also reports problems with constipation for which she tries fiber and stool softeners without results. Finally, increased intestinal gas in the form of flatus. She states that this is embarrassing. She estimates a 7 pound weight loss over the past year. She has had multiple prior abdominal surgeries including cholecystectomy, appendectomy, and hysterectomy. No recent imaging studies her blood work. Her last colonoscopy was performed in August 2009. The examination revealed marked pan diverticulosis and a diminutive colon polyp which was removed. Routine followup in 5 years recommended. Her last upper endoscopy with esophageal dilation to a maximal diameter of 20 mm was performed October 2008. For GERD she continues on pantoprazole. No significant reflux symptoms no occasional regurgitation and nausea. Rare vomiting. No significant dysphagia. She previously had minor rectal bleeding attributed to hemorrhoids, but nothing recently.  REVIEW OF SYSTEMS:  All non-GI ROS negative except for hearing impairment, arthritis, low back pain, urinary leakage, increased thirst  Past Medical History  Diagnosis Date  . Watermelon stomach   . Peptic stricture of esophagus   . GERD  (gastroesophageal reflux disease)   . Chronic allergic rhinitis     with asthmatic component  . Hormone replacement therapy (postmenopausal)   . History of PSVT (paroxysmal supraventricular tachycardia)   . History of pneumonia   . History of colon polyps   . Hearing impairment   . History of bursitis     right shoulder  . Hiatal hernia   . Diverticulosis of colon (without mention of hemorrhage)   . IBS (irritable bowel syndrome)     Past Surgical History  Procedure Date  . Polypectomy   . Appendectomy   . Urethral dilatations   . Tubal ligation   . Abdominal hysterectomy   . Cholecystectomy   . Ventricular ablation surgery     Social History Emily Bentley  reports that she has never smoked. She has never used smokeless tobacco. She reports that she does not drink alcohol or use illicit drugs.  family history includes Coronary artery disease in her father; Diabetes in her mother; Hypertension in her father; and Uterine cancer in her mother.  There is no history of Colon cancer.  Allergies  Allergen Reactions  . Sulfamethoxazole W/Trimethoprim        PHYSICAL EXAMINATION: Vital signs: BP 124/80  Pulse 68  Ht 5\' 5"  (1.651 m)  Wt 181 lb (82.101 kg)  BMI 30.12 kg/m2 General: Well-developed, well-nourished, no acute distress HEENT: Sclerae are anicteric, conjunctiva pink. Oral mucosa intact. Hearing impaired Lungs: Clear Heart: Regular with extra heart sound Abdomen: soft, obese, epigastric tenderness to palpation but nondistended, no obvious ascites, no peritoneal signs, normal bowel sounds. No organomegaly. Large scar in the right upper quadrant is well healed. No obvious hernia Extremities: No edema Psychiatric: alert and oriented x3. Cooperative    ASSESSMENT:  #1. Postprandial abdominal pain  with bloating. Likely secondary to constipation. Mild weight loss as described. Previous upper endoscopy and colonoscopy as described. #2. GERD complicated by peptic  stricture. Currently asymptomatic post dilation on PPI #3. Constipation  #4. Increased intestinal gas   PLAN:  #1. GlycoLax 17 g in 8 ounces of water daily. Titrated the agent to need #2. Probiotic Align one daily for several weeks. Samples given #3. Anti-gas and flatulence dietary sheet as well as educational brochure on intestinal gas #4. Schedule CT scan of the abdomen and pelvis with contrast to evaluate chronic worsening abdominal pain with weight loss #5. GI office followup in 4-6 weeks

## 2011-11-05 ENCOUNTER — Other Ambulatory Visit: Payer: Medicare Other

## 2011-11-09 ENCOUNTER — Telehealth: Payer: Self-pay | Admitting: *Deleted

## 2011-11-09 ENCOUNTER — Ambulatory Visit (INDEPENDENT_AMBULATORY_CARE_PROVIDER_SITE_OTHER)
Admission: RE | Admit: 2011-11-09 | Discharge: 2011-11-09 | Disposition: A | Discharge status: Home / Self Care | Payer: Medicare Other | Source: Ambulatory Visit | Attending: Internal Medicine | Admitting: Internal Medicine

## 2011-11-09 DIAGNOSIS — K5900 Constipation, unspecified: Secondary | ICD-10-CM

## 2011-11-09 DIAGNOSIS — R1084 Generalized abdominal pain: Secondary | ICD-10-CM

## 2011-11-09 DIAGNOSIS — R141 Gas pain: Secondary | ICD-10-CM

## 2011-11-09 DIAGNOSIS — R142 Eructation: Secondary | ICD-10-CM

## 2011-11-09 MED ORDER — IOHEXOL 300 MG/ML  SOLN
100.0000 mL | Freq: Once | INTRAMUSCULAR | Status: AC | PRN
  Administered 2011-11-09: 100 mL via INTRAVENOUS

## 2011-11-09 NOTE — Telephone Encounter (Signed)
Spoke with patient and gave her results. She will make the appointment on Wednesday for return OV

## 2011-11-09 NOTE — Telephone Encounter (Signed)
Message copied by Daphine Deutscher on Mon Nov 09, 2011  1:00 PM ------      Message from: Hilarie Fredrickson      Created: Mon Nov 09, 2011 12:45 PM       Let pt know that CT looks fine. No new recommendations (see my last office note). Keep follow up in 4-6 weeks as previously recommended.

## 2011-12-07 ENCOUNTER — Ambulatory Visit (INDEPENDENT_AMBULATORY_CARE_PROVIDER_SITE_OTHER): Payer: Medicare Other | Admitting: Internal Medicine

## 2011-12-07 ENCOUNTER — Encounter: Payer: Self-pay | Admitting: Internal Medicine

## 2011-12-07 VITALS — BP 142/80 | HR 82 | Ht 65.0 in | Wt 185.0 lb

## 2011-12-07 DIAGNOSIS — K219 Gastro-esophageal reflux disease without esophagitis: Secondary | ICD-10-CM | POA: Diagnosis not present

## 2011-12-07 DIAGNOSIS — R141 Gas pain: Secondary | ICD-10-CM | POA: Diagnosis not present

## 2011-12-07 DIAGNOSIS — K5900 Constipation, unspecified: Secondary | ICD-10-CM | POA: Diagnosis not present

## 2011-12-07 DIAGNOSIS — R142 Eructation: Secondary | ICD-10-CM

## 2011-12-07 DIAGNOSIS — R143 Flatulence: Secondary | ICD-10-CM | POA: Diagnosis not present

## 2011-12-07 DIAGNOSIS — R1084 Generalized abdominal pain: Secondary | ICD-10-CM | POA: Diagnosis not present

## 2011-12-07 NOTE — Progress Notes (Signed)
HISTORY OF PRESENT ILLNESS:  Emily Bentley is a 77 y.o. female with multiple significant problems as listed below. She was evaluated 10/30/2011 regarding multiple GI complaints such as postprandial Gomco pain with bloating, constipation, and increased intestinal gas. See that dictation for details. CT scan of the abdomen and pelvis was negative. She was treated with GlycoLax, probiotic, and anti-gas measures. She presents today for followup. She is pleased to report that she is markedly better. No significant abdominal pain. Less gas. Bowel habits have improved, but still may go a day without a bowel movement. No new complaints. She took friends Nexium in lieu of her pantoprazole. Thinks this helps. No new GI complaints.Marland Kitchen  REVIEW OF SYSTEMS:  All non-GI ROS negative except for hearing impairment  Past Medical History  Diagnosis Date  . Watermelon stomach   . Peptic stricture of esophagus   . GERD (gastroesophageal reflux disease)   . Chronic allergic rhinitis     with asthmatic component  . Hormone replacement therapy (postmenopausal)   . History of PSVT (paroxysmal supraventricular tachycardia)   . History of pneumonia   . History of colon polyps   . Hearing impairment   . History of bursitis     right shoulder  . Hiatal hernia   . Diverticulosis of colon (without mention of hemorrhage)   . IBS (irritable bowel syndrome)     Past Surgical History  Procedure Date  . Polypectomy   . Appendectomy   . Urethral dilatations   . Tubal ligation   . Abdominal hysterectomy   . Cholecystectomy   . 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 She has never used smokeless tobacco. She reports that she does not drink alcohol or use illicit drugs.  family history includes Coronary artery disease in her father; Diabetes in her mother; Hypertension in her father; and Uterine cancer in her mother.  There is no history of Colon  cancer.  Allergies  Allergen Reactions  . Sulfamethoxazole W/Trimethoprim        PHYSICAL EXAMINATION: Vital signs: BP 142/80  Pulse 82  Ht 5\' 5"  (1.651 m)  Wt 185 lb (83.915 kg)  BMI 30.79 kg/m2 General: Well-developed, well-nourished, no acute distress HEENT: Sclerae are anicteric, conjunctiva pink. Oral mucosa intact Lungs: Clear Heart: Regular Abdomen: soft, nontender, nondistended, no obvious ascites, no peritoneal signs, normal bowel sounds. No organomegaly. Extremities: No edema Psychiatric: alert and oriented x3. Cooperative      ASSESSMENT:  #1. Postprandial abdominal pain with bloating secondary to constipation. Improve #2. Constipation. Improved with MiraLax. #3. GERD complicated by peptic stricture. Remains asymptomatic post dilation on PPI #4. Gas. Improved after trial of probiotic and anti-gas measures   PLAN:  #1. Continue MiraLax. She may increase this to achieve one to 2 bowel movements daily #2. Continue PPI #3. Continue reflux precautions #4. Continue anti-gas measures #5. GI followup when necessary

## 2011-12-07 NOTE — Patient Instructions (Signed)
Follow up as needed

## 2012-04-16 ENCOUNTER — Other Ambulatory Visit: Payer: Self-pay | Admitting: Internal Medicine

## 2012-05-24 ENCOUNTER — Encounter: Payer: Self-pay | Admitting: Internal Medicine

## 2012-05-24 ENCOUNTER — Ambulatory Visit (INDEPENDENT_AMBULATORY_CARE_PROVIDER_SITE_OTHER): Payer: Medicare Other | Admitting: Internal Medicine

## 2012-05-24 VITALS — BP 126/90 | HR 78 | Temp 97.1°F | Resp 16 | Wt 182.0 lb

## 2012-05-24 DIAGNOSIS — N318 Other neuromuscular dysfunction of bladder: Secondary | ICD-10-CM | POA: Diagnosis not present

## 2012-05-24 DIAGNOSIS — N3281 Overactive bladder: Secondary | ICD-10-CM

## 2012-05-24 DIAGNOSIS — H9190 Unspecified hearing loss, unspecified ear: Secondary | ICD-10-CM

## 2012-05-24 MED ORDER — MIRABEGRON ER 25 MG PO TB24: 25.0000 mg | ORAL_TABLET | Freq: Every day | ORAL | Status: DC

## 2012-05-24 NOTE — Patient Instructions (Addendum)
Hearing - a referral has been put in for the [facility].  Scalp lesion - it looks benign. I would not do anything about this unless it grows or becomes symptomatic  Urinary frequency - trial of Myrbetrig.

## 2012-05-25 DIAGNOSIS — N3281 Overactive bladder: Secondary | ICD-10-CM | POA: Insufficient documentation

## 2012-05-25 NOTE — Assessment & Plan Note (Signed)
Continued problem.   Plan Referral to the Hearing clinic for audiologic testing

## 2012-05-25 NOTE — Assessment & Plan Note (Signed)
Patient with urinary frequency and nocturia. Her symptoms are still bothersome despite use of Vesicare.  Plan Trial of Myrbetrig 25 mg daily (14 samples provided)

## 2012-05-25 NOTE — Progress Notes (Signed)
  Subjective:    Patient ID: Emily Bentley, female    DOB: Dec 01, 1934, 77 y.o.   MRN: [id number]  HPI Emily Bentley presents to request referral to Hearing clinic for evaluation of hearing loss.  She has a small lesion on her vertex scalp which she wants looked at.  PMH, FamHx and SocHx reviewed for any changes and relevance.    Review of Systems System review is negative for any constitutional, cardiac, pulmonary, GI or neuro symptoms or complaints other than as described in the HPI.      Objective:   Physical Exam  Filed Vitals:   05/24/12 1018  BP: 126/90  Pulse: 78  Temp: 97.1 F (36.2 C)  Resp: 16   Gen'l- overweight AA woman in no distress HEENT- C&S clear Cor- 2+ radial pulse, RRR Pulm - normal respirations Derm - small macular/papular lesion on right vertex scalp that is not suspicious in appearance      Assessment & Plan:  Skin lesion - scalp lesion has benign appearance, uniform tan color and raised c/w small cyst.  Plan- watchful waiting and reexam if there is enlargement.

## 2012-05-31 DIAGNOSIS — H903 Sensorineural hearing loss, bilateral: Secondary | ICD-10-CM | POA: Diagnosis not present

## 2012-07-01 ENCOUNTER — Ambulatory Visit (INDEPENDENT_AMBULATORY_CARE_PROVIDER_SITE_OTHER): Payer: Medicare Other | Admitting: Endocrinology

## 2012-07-01 ENCOUNTER — Other Ambulatory Visit: Payer: Medicare Other

## 2012-07-01 ENCOUNTER — Encounter: Payer: Self-pay | Admitting: Endocrinology

## 2012-07-01 VITALS — BP 122/80 | HR 91 | Temp 98.1°F

## 2012-07-01 DIAGNOSIS — R350 Frequency of micturition: Secondary | ICD-10-CM

## 2012-07-01 DIAGNOSIS — R82998 Other abnormal findings in urine: Secondary | ICD-10-CM

## 2012-07-01 LAB — POCT URINALYSIS DIPSTICK
Glucose, UA: NEGATIVE
Leukocytes, UA: NEGATIVE
Nitrite, UA: NEGATIVE
Urobilinogen, UA: NEGATIVE
pH, UA: 6.5

## 2012-07-01 MED ORDER — CIPROFLOXACIN HCL 500 MG PO TABS: 500.0000 mg | ORAL_TABLET | Freq: Two times a day (BID) | ORAL | Status: DC

## 2012-07-01 NOTE — Patient Instructions (Addendum)
i have sent a prescription to your pharmacy, for an antibiotic on a trial basis. I hope you feel better soon.  If you don't feel better by next week, please call dr Debby Bud.

## 2012-07-01 NOTE — Progress Notes (Signed)
Subjective:    Patient ID: Emily Bentley, female    DOB: [birth year], 77 y.o.   MRN: [id number]  HPI Pt states 1 week of moderate pain at the urethra, in the context of urination, but no assoc fever.  Last uti was many years ago.   Past Medical History  Diagnosis Date  . Watermelon stomach   . Peptic stricture of esophagus   . GERD (gastroesophageal reflux disease)   . Chronic allergic rhinitis     with asthmatic component  . Hormone replacement therapy (postmenopausal)   . History of PSVT (paroxysmal supraventricular tachycardia)   . History of pneumonia   . History of colon polyps   . Hearing impairment   . History of bursitis     right shoulder  . Hiatal hernia   . Diverticulosis of colon (without mention of hemorrhage)   . IBS (irritable bowel syndrome)     Past Surgical History  Procedure Date  . Polypectomy   . Appendectomy   . Urethral dilatations   . Tubal ligation   . Abdominal hysterectomy   . Cholecystectomy   . Ventricular ablation surgery     History   Social History  . Marital Status: Widowed    Spouse Name: N/A    Number of Children: 4  . Years of Education: N/A   Occupational History  .  [employer]   Social History Main Topics  . Smoking status: Never Smoker   . Smokeless tobacco: Never Used  . Alcohol Use: No  . Drug Use: No  . Sexually Active: Not on file   Other Topics Concern  . 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 Retired '76Live- aloneOcc caffeine     Current Outpatient Prescriptions on File Prior to Visit  Medication Sig Dispense Refill  . ALPRAZolam (XANAX) 0.5 MG tablet Take 1 tablet (0.5 mg total) by mouth at bedtime as needed.  30 tablet  5  . bifidobacterium infantis (ALIGN) capsule Take 1 capsule by mouth daily.  28 capsule  0  . Magnesium Hydroxide (MILK OF MAGNESIA  PO) Take by mouth as directed.        . mirabegron ER (MYRBETRIQ) 25 MG TB24 Take 1 tablet (25 mg total) by mouth daily.  30 tablet  11  . Multiple Vitamins-Minerals (MULTIVITAL PLATINUM PO) Take 1 tablet by mouth daily.        . Multiple Vitamins-Minerals (OCUVITE PO) Take 1 tablet by mouth daily.        . pantoprazole (PROTONIX) 40 MG tablet take 1 tablet by mouth once daily  30 tablet  11  . solifenacin (VESICARE) 10 MG tablet as directed.          Allergies  Allergen Reactions  . Sulfamethoxazole W-Trimethoprim     Family History  Problem Relation Age of Onset  . Diabetes Mother   . Coronary artery disease Father     CABG x 5 in 1985  . Hypertension Father   . Uterine cancer Mother   . Colon cancer Neg Hx     BP 122/80  Pulse 91  Temp 98.1 F (36.7 C) (Oral)  SpO2 95%  Review of Systems Denies hematuria, but she has urinary frequency.     Objective:   Physical Exam VITAL SIGNS:  See vs page GENERAL: no distress Abd: slight  suprapubic tenderness.     (i reviewed [place] result)    Assessment & Plan:  Urinary sxs, new.  uncertain etiology

## 2012-07-08 ENCOUNTER — Ambulatory Visit (INDEPENDENT_AMBULATORY_CARE_PROVIDER_SITE_OTHER): Payer: Medicare Other | Admitting: Internal Medicine

## 2012-07-08 ENCOUNTER — Encounter: Payer: Self-pay | Admitting: Internal Medicine

## 2012-07-08 ENCOUNTER — Encounter: Payer: Self-pay | Admitting: *Deleted

## 2012-07-08 VITALS — BP 130/72 | HR 67 | Temp 98.1°F | Ht 65.0 in | Wt 182.1 lb

## 2012-07-08 DIAGNOSIS — M545 Low back pain: Secondary | ICD-10-CM | POA: Diagnosis not present

## 2012-07-08 DIAGNOSIS — M7542 Impingement syndrome of left shoulder: Secondary | ICD-10-CM

## 2012-07-08 MED ORDER — METHOCARBAMOL 500 MG PO TABS: 500.0000 mg | ORAL_TABLET | Freq: Three times a day (TID) | ORAL | Status: AC | PRN

## 2012-07-08 MED ORDER — DICLOFENAC SODIUM 75 MG PO TBEC: 75.0000 mg | DELAYED_RELEASE_TABLET | Freq: Two times a day (BID) | ORAL | Status: AC

## 2012-07-08 NOTE — Progress Notes (Signed)
  Subjective:    Patient ID: Emily Bentley, female    DOB: 1935-09-22, 77 y.o.   MRN: [id number]  HPI See CC above - L shoulder pain  limited overhead motion L shoulder due to  Unrelieved with OTC Aleve No precipitating injury Hx same in r shoulder relieved by steroid injection  Ache in low back precipitated by overuse - lifting pallets No radiation of back -  Sore at night when lying supine  Past Medical History  Diagnosis Date  . Watermelon stomach   . Peptic stricture of esophagus   . GERD (gastroesophageal reflux disease)   . Chronic allergic rhinitis     with asthmatic component  . Hormone replacement therapy (postmenopausal)   . History of PSVT (paroxysmal supraventricular tachycardia)   . History of pneumonia   . History of colon polyps   . Hearing impairment   . History of bursitis     right shoulder  . Hiatal hernia   . Diverticulosis of colon (without mention of hemorrhage)   . IBS (irritable bowel syndrome)      Review of Systems  Constitutional: Negative for fever, fatigue and unexpected weight change.  Genitourinary: Negative for dysuria and flank pain.  Musculoskeletal: Negative for myalgias, joint swelling, arthralgias and gait problem.       Objective:   Physical Exam BP 130/72  Pulse 67  Temp 98.1 F (36.7 C) (Oral)  Ht 5\' 5"  (1.651 m)  Wt 182 lb 1.9 oz (82.609 kg)  BMI 30.31 kg/m2  SpO2 97% Constitutional: She appears well-developed and well-nourished. No distress.  Neck: Normal range of motion. Neck supple. No JVD present. No thyromegaly present.  Cardiovascular: Normal rate, regular rhythm and normal heart sounds.  No murmur heard. No BLE edema. Pulmonary/Chest: Effort normal and breath sounds normal. No respiratory distress. She has no wheezes.  Musculoskeletal: L shoulder: decreased range of motion on forward flexion, abduction, and internal rotation. Positive impingement signs. Decreased strength with stressing of rotator cuff. Pain with  crossed arm adduction. referred pain into distal deltoid. Tender over a.c. joint and subacromial. Back: full range of motion of thoracic and lumbar spine. Non tender to palpation. Negative straight leg raise. DTR's are symmetrically intact. Sensation intact in all dermatomes of the lower extremities. Full strength to manual muscle testing. patient is able to heel toe walk without difficulty and ambulates with antalgic gait.  Skin: Skin is warm and dry. No rash noted. No erythema.  Psychiatric: She has a normal mood and affect. Her behavior is normal. Judgment and thought content normal.    Procedure note - L shoulder injection, subacromial Indication - impingement syndrome After obtaining verbal informed consent, using sterile technique throughout, shoulder was injected posterior lateral into the subacromial space with 1:3 cc Depo-Medrol 80mg /ml: 1% lidocaine without epi. Patient tolerated procedure well, no complications. Postinjection instructions given: Ice 24-48 hours at injection site, heat thereafter, maintaining motion. Patient to call if any concerns.      Assessment & Plan:   L impingement syndrome from bursitis - injection as above - rx NSAID in place of Aleve - lumbago - exam benign - muscle relaxer qhs in addtion to rx NSAID  Work note provided as requested

## 2012-07-08 NOTE — Patient Instructions (Signed)
It was good to see you today. We have injected your shoulder today Use Voltaren in pace of Aleve 2x/day with food Also use Robaxin as needed for back pain Your prescription(s) have been submitted to your pharmacy. Please take as directed and contact our office if you believe you are having problem(s) with the medication(s). Work note as requested

## 2012-07-28 DIAGNOSIS — H903 Sensorineural hearing loss, bilateral: Secondary | ICD-10-CM | POA: Diagnosis not present

## 2012-08-01 ENCOUNTER — Other Ambulatory Visit: Payer: Self-pay | Admitting: *Deleted

## 2012-08-19 ENCOUNTER — Ambulatory Visit: Payer: Medicare Other | Admitting: Internal Medicine

## 2012-08-25 DIAGNOSIS — H903 Sensorineural hearing loss, bilateral: Secondary | ICD-10-CM | POA: Diagnosis not present

## 2012-08-25 DIAGNOSIS — Z01810 Encounter for preprocedural cardiovascular examination: Secondary | ICD-10-CM | POA: Diagnosis not present

## 2012-09-05 DIAGNOSIS — K219 Gastro-esophageal reflux disease without esophagitis: Secondary | ICD-10-CM | POA: Diagnosis not present

## 2012-09-05 DIAGNOSIS — Z90710 Acquired absence of both cervix and uterus: Secondary | ICD-10-CM | POA: Diagnosis not present

## 2012-09-05 DIAGNOSIS — H903 Sensorineural hearing loss, bilateral: Secondary | ICD-10-CM | POA: Diagnosis not present

## 2012-09-05 DIAGNOSIS — Z9851 Tubal ligation status: Secondary | ICD-10-CM | POA: Diagnosis not present

## 2012-09-05 DIAGNOSIS — Z9889 Other specified postprocedural states: Secondary | ICD-10-CM | POA: Diagnosis not present

## 2012-09-07 ENCOUNTER — Telehealth: Payer: Self-pay | Admitting: *Deleted

## 2012-09-07 NOTE — Telephone Encounter (Signed)
R'cd fax from [employer] for Protonix-PA approved 07/08/2012-09/07/2013. [facility] informed.

## 2012-09-09 ENCOUNTER — Telehealth: Payer: Self-pay | Admitting: *Deleted

## 2012-09-09 NOTE — Telephone Encounter (Signed)
PA faxed approval to [facility] pharmacy for pt, protonix.

## 2012-09-13 DIAGNOSIS — H903 Sensorineural hearing loss, bilateral: Secondary | ICD-10-CM | POA: Diagnosis not present

## 2012-09-13 DIAGNOSIS — Z09 Encounter for follow-up examination after completed treatment for conditions other than malignant neoplasm: Secondary | ICD-10-CM | POA: Diagnosis not present

## 2012-09-16 ENCOUNTER — Ambulatory Visit (INDEPENDENT_AMBULATORY_CARE_PROVIDER_SITE_OTHER): Payer: Medicare Other | Admitting: *Deleted

## 2012-09-16 DIAGNOSIS — Z23 Encounter for immunization: Secondary | ICD-10-CM

## 2012-09-27 DIAGNOSIS — H903 Sensorineural hearing loss, bilateral: Secondary | ICD-10-CM | POA: Diagnosis not present

## 2012-09-27 DIAGNOSIS — Z48810 Encounter for surgical aftercare following surgery on the sense organs: Secondary | ICD-10-CM | POA: Diagnosis not present

## 2012-10-12 DIAGNOSIS — H903 Sensorineural hearing loss, bilateral: Secondary | ICD-10-CM | POA: Diagnosis not present

## 2012-10-28 DIAGNOSIS — H903 Sensorineural hearing loss, bilateral: Secondary | ICD-10-CM | POA: Diagnosis not present

## 2012-12-07 DIAGNOSIS — H903 Sensorineural hearing loss, bilateral: Secondary | ICD-10-CM | POA: Diagnosis not present

## 2013-01-05 ENCOUNTER — Encounter: Payer: Self-pay | Admitting: Internal Medicine

## 2013-01-05 ENCOUNTER — Ambulatory Visit (INDEPENDENT_AMBULATORY_CARE_PROVIDER_SITE_OTHER): Payer: Medicare Other | Admitting: Internal Medicine

## 2013-01-05 VITALS — BP 118/74 | HR 70 | Temp 98.3°F | Resp 10 | Wt 181.0 lb

## 2013-01-05 DIAGNOSIS — M7120 Synovial cyst of popliteal space [Baker], unspecified knee: Secondary | ICD-10-CM | POA: Diagnosis not present

## 2013-01-05 NOTE — Patient Instructions (Addendum)
Fluid collection right popliteal fossa with mild crepitus with passive movement of the knee consistent with a Baker's cyst.  Plan - NSAID, e.g Aleve twice a day  If the swelling does not get better - U/S lower extremity and possible aspiration of fluid.    Baker's Cyst A Baker's cyst is a swelling that forms in the back of the knee. It is a sac-like structure. It is filled with the same fluid that is located in your knee. The fluid located in your knee is necessary because it lubricates the bones and cartilage. It allows them to move over each other more easily. CAUSES  When the knee becomes injured or has soreness (inflammation) present, more fluid forms in the knee. When this happens, the joint lining is pushed out behind the knee and forms the baker's cyst. This cyst may also be caused by inflammation from arthritic conditions and infections. DIAGNOSIS  A Baker's cyst is most often diagnosed with an ultrasound. This is a specialized picture (like an X-ray). It shows a picture by using sound waves. Sometimes a specialized x-ray called an MRI (magnetic resonance imaging) is used. This picks up other problems within a joint if an ultrasound alone cannot make the diagnosis. If the cyst came immediately following an injury, plain x-rays may be used to make a diagnosis. TREATMENT  The treatment depends on the cause of the cyst. But most of these cysts are caused by an inflammation. Anti-inflammatory medications and rest often will get rid of the problem. If the cyst is caused by an infection, medications (antibiotics) will be prescribed to help this. Take the medications as directed. Refer to Home Care Instructions, below, for additional treatment suggestions. HOME CARE INSTRUCTIONS   If the cyst was caused by an injury, for the first 24 hours, while lying down, keep the injured extremity elevated on 2 pillows.  For the first 24 hours while you are awake, apply ice bags (ice in a plastic bag with a  towel around it to prevent frostbite to skin) 3 to 4 times per day for 15 to 20 minutes to the injured area. Then do as directed by your caregiver.  Only take over-the-counter or prescription medicines for pain, discomfort, or fever as directed by your caregiver. Persistent pain and inability to use the injured area for more than 2 to 3 days are warning signs indicating that you should see a caregiver for a follow-up visit as soon as possible. Persistent pain and swelling indicate that further evaluation, non-weight bearing (use of crutches as instructed), and/or further x-rays are needed. Make a follow-up appointment with your own caregiver. If conservative measures (rest, medications and inactivity) do not help the problem get better, sometimes surgery for removal of the cyst is needed. Reasons for this may be that the cyst is pressing on nerves and/or vessels and causing problems which cannot wait for improvement with conservative treatment. If the problem is caused by injuries to the cartilage in the knee, surgery is often needed for treatment of that problem. MAKE SURE YOU:   Understand these instructions.  Will watch your condition.  Will get help right away if you are not doing well or get worse. Document Released: 10/26/2005 Document Revised: 01/18/2012 Document Reviewed: 06/13/2008 [facility] Patient Information 2013 [place], [place].

## 2013-01-05 NOTE — Progress Notes (Signed)
Subjective:    Patient ID: TAMYIA MINICH, female    DOB: 03/30/1935, 78 y.o.   MRN: [id number]  HPI Mrs. Koppenhaver presents for a week long h/o right knee swelling, mild pain with fullness and tightness posteriorly. With walking it is worse. No history of knee pain or problems. It is particularly hard to stand after sitting for any time but gets better with activity.   IN the interval she has had a cochlear implant done at [facility]. This has been a big improvement. A 75% comprehension increase and 90% increase in sound.  Past Medical History  Diagnosis Date  . Watermelon stomach   . Peptic stricture of esophagus   . GERD (gastroesophageal reflux disease)   . Chronic allergic rhinitis     with asthmatic component  . Hormone replacement therapy (postmenopausal)   . History of PSVT (paroxysmal supraventricular tachycardia)   . History of pneumonia   . History of colon polyps   . Hearing impairment   . History of bursitis     right shoulder  . Hiatal hernia   . Diverticulosis of colon (without mention of hemorrhage)   . IBS (irritable bowel syndrome)    Past Surgical History  Procedure Laterality Date  . Polypectomy    . Appendectomy    . Urethral dilatations    . Tubal ligation    . Abdominal hysterectomy    . Cholecystectomy    . Ventricular ablation surgery     Family History  Problem Relation Age of Onset  . Diabetes Mother   . Coronary artery disease Father     CABG x 5 in 1985  . Hypertension Father   . Uterine cancer Mother   . Colon cancer Neg Hx    History   Social History  . Marital Status: Widowed    Spouse Name: N/A    Number of Children: 4  . Years of Education: N/A   Occupational History  .  [employer]   Social History Main Topics  . Smoking status: Never Smoker   . Smokeless tobacco: Never Used  . Alcohol Use: No  . Drug Use: No  . Sexually Active: Not on file   Other Topics Concern  . 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 Multiple Vitamins-Minerals (MULTIVITAL PLATINUM PO) Take 1 tablet by mouth daily.        . Multiple Vitamins-Minerals (OCUVITE PO) Take 1 tablet by mouth daily.        . pantoprazole (PROTONIX) 40 MG tablet take 1 tablet by mouth once daily  30 tablet  11  . ALPRAZolam (XANAX) 0.5 MG tablet Take 1 tablet (0.5 mg total) by mouth at bedtime as needed.  30 tablet  5  . bifidobacterium infantis (ALIGN) capsule Take 1 capsule by mouth daily.  28 capsule  0  . ciprofloxacin (CIPRO) 500 MG tablet Take 1 tablet (500 mg total) by mouth 2 (two) times daily.  14 tablet  0  . diclofenac (VOLTAREN) 75 MG EC tablet Take 1 tablet (75 mg total) by mouth 2 (two) times daily with a meal.  60 tablet  0  . Magnesium Hydroxide (MILK OF MAGNESIA PO) Take by mouth as directed.         No current facility-administered medications on file prior to visit.      Review of Systems System review is negative for any constitutional, cardiac, pulmonary, GI or neuro symptoms or complaints other than as described in the HPI.     Objective:   Physical Exam Filed Vitals:   01/05/13 1552  BP: 118/74  Pulse: 70  Temp: 98.3 F (36.8 C)  Resp: 10   Wt Readings from Last 3 Encounters:  01/05/13 181 lb (82.101 kg)  07/08/12 182 lb 1.9 oz (82.609 kg)  05/24/12 182 lb (82.555 kg)   Gen'l -WNWD AA in no distress Cor - RRR Pulm - normla respirations MSK - normal ROM right knee, minimal crepitus. Small anterior effusion and there is a fluid collection in the popliteal fossa      Assessment & Plan:

## 2013-01-05 NOTE — Assessment & Plan Note (Signed)
Fluid collection right popliteal fossa with mild crepitus with passive movement of the knee consistent with a Baker's cyst.  Plan - NSAID, e.g Aleve twice a day  If the swelling does not get better - U/S lower extremity and possible aspiration of fluid.

## 2013-01-12 DIAGNOSIS — H903 Sensorineural hearing loss, bilateral: Secondary | ICD-10-CM | POA: Diagnosis not present

## 2013-01-23 ENCOUNTER — Encounter: Payer: Self-pay | Admitting: Internal Medicine

## 2013-03-23 ENCOUNTER — Ambulatory Visit (INDEPENDENT_AMBULATORY_CARE_PROVIDER_SITE_OTHER): Payer: Medicare Other | Admitting: Internal Medicine

## 2013-03-23 ENCOUNTER — Encounter: Payer: Self-pay | Admitting: Internal Medicine

## 2013-03-23 VITALS — BP 142/84 | HR 57 | Temp 98.1°F

## 2013-03-23 DIAGNOSIS — M7121 Synovial cyst of popliteal space [Baker], right knee: Secondary | ICD-10-CM

## 2013-03-23 DIAGNOSIS — M25561 Pain in right knee: Secondary | ICD-10-CM

## 2013-03-23 DIAGNOSIS — M25569 Pain in unspecified knee: Secondary | ICD-10-CM | POA: Diagnosis not present

## 2013-03-23 DIAGNOSIS — M7120 Synovial cyst of popliteal space [Baker], unspecified knee: Secondary | ICD-10-CM | POA: Diagnosis not present

## 2013-03-23 NOTE — Progress Notes (Signed)
  Subjective:    Patient ID: Emily Bentley, female    DOB: 02-Apr-1935, 78 y.o.   MRN: [id number]  HPI  Complains of continued right knee pain Associated with swelling and sensation of tightness See prior office visit Re: Baker cyst diagnosis Unimproved with over-the-counter anti-inflammatory, min improvement with Tylenol  Past Medical History  Diagnosis Date  . Watermelon stomach   . Peptic stricture of esophagus   . GERD (gastroesophageal reflux disease)   . Chronic allergic rhinitis     with asthmatic component  . Hormone replacement therapy (postmenopausal)   . History of PSVT (paroxysmal supraventricular tachycardia)   . History of pneumonia   . History of colon polyps   . Hearing impairment   . History of bursitis     right shoulder  . Hiatal hernia   . Diverticulosis of colon (without mention of hemorrhage)   . IBS (irritable bowel syndrome)      Review of Systems  Constitutional: Negative for fever and fatigue.  Musculoskeletal: Positive for joint swelling. Negative for back pain.  Skin: Negative for rash and wound.       Objective:   Physical Exam BP 142/84  Pulse 57  Temp(Src) 98.1 F (36.7 C) (Oral)  SpO2 96% Gen: HOH, NAD MSKel: R knee - boggy synovitis - tender to palpation over joint line; FROM and ligamentous function intact - posterior popliteal fullness consistent with Baker's cyst.  Lab Results  Component Value Date   WBC 4.2 02/12/2010   HGB 12.3 02/12/2010   HCT 37.3 02/12/2010   PLT 209 02/12/2010   GLUCOSE 89 10/30/2011   CHOL 204* 11/24/2006   TRIG 62 11/24/2006   HDL 55.3 11/24/2006   LDLDIRECT 125.4 11/24/2006   ALT 18 02/11/2010   AST 44* 02/11/2010   NA 142 10/30/2011   K 4.2 10/30/2011   CL 107 10/30/2011   CREATININE 1.0 10/30/2011   BUN 18 10/30/2011   CO2 27 10/30/2011   TSH 1.55 03/19/2008   Procedure Note: knee intra-articular injection Indication: knee swelling, OA flare and Bakers cyst  the patient elects to proceed after  verbal consent is obtained. the patient informed of possible risks and complications prior to procedure. Using sterile technique throughout, patient is injected with 1:2  80 mg depomedrol: 1% lidocaine inferior laterally into knee. the patient tolerated the procedure well. No immediate complications. Ice 24-48h, heat thereafter as needed; instructions and aftercare provided.      Assessment & Plan:   Right knee swelling and pain, suspect OA flare with Baker's cyst Symptomatically improved with Tylenol but not resolved No improvement with anti-inflammatory Steroid injection as above today Instructions aftercare provided Patient will call if unimproved in next 5-7 days for referral to orthopedics, sooner if worse

## 2013-03-23 NOTE — Patient Instructions (Signed)
It was good to see you today. Injection of your right knee for pain control was performed today - place ice over the injection site for 5 minutes every 4-6 hours for next 24h, then as needed. call if increasing pain or other problems for referral to orthopedic specialist as discussed if needed   Knee Injection Joint injections are shots. Your caregiver will place a needle into your knee joint. The needle is used to put medicine into the joint. These shots can be used to help treat different painful knee conditions such as osteoarthritis, bursitis, local flare-ups of rheumatoid arthritis, and pseudogout. Anti-inflammatory medicines such as corticosteroids and anesthetics are the most common medicines used for joint and soft tissue injections.  PROCEDURE  The skin over the kneecap will be cleaned with an antiseptic solution.  Your caregiver will inject a small amount of a local anesthetic (a medicine like Novocaine) just under the skin in the area that was cleaned.  After the area becomes numb, a second injection is done. This second injection usually includes an anesthetic and an anti-inflammatory medicine called a steroid or cortisone. The needle is carefully placed in between the kneecap and the knee, and the medicine is injected into the joint space.  After the injection is done, the needle is removed. Your caregiver may place a bandage over the injection site. The whole procedure takes no more than a couple of minutes. BEFORE THE PROCEDURE  Wash all of the skin around the entire knee area. Try to remove any loose, scaling skin. There is no other specific preparation necessary unless advised otherwise by your caregiver. LET YOUR CAREGIVER KNOW ABOUT:   Allergies.  Medications taken including herbs, eye drops, over the counter medications, and creams.  Use of steroids (by mouth or creams).  Possible pregnancy, if applicable.  Previous problems with anesthetics or Novocaine.  History of  blood clots (thrombophlebitis).  History of bleeding or blood problems.  Previous surgery.  Other health problems. RISKS AND COMPLICATIONS Side effects from cortisone shots are rare. They include:   Slight bruising of the skin.  Shrinkage of the normal fatty tissue under the skin where the shot was given.  Increase in pain after the shot.  Infection.  Weakening of tendons or tendon rupture.  Allergic reaction to the medicine.  Diabetics may have a temporary increase in their blood sugar after a shot.  Cortisone can temporarily weaken the immune system. While receiving these shots, you should not get certain vaccines. Also, avoid contact with anyone who has chickenpox or measles. Especially if you have never had these diseases or have not been previously immunized. Your immune system may not be strong enough to fight off the infection while the cortisone is in your system. AFTER THE PROCEDURE   You can go home after the procedure.  You may need to put ice on the joint 15 to 20 minutes every 3 or 4 hours until the pain goes away.  You may need to put an elastic bandage on the joint. HOME CARE INSTRUCTIONS   Only take over-the-counter or prescription medicines for pain, discomfort, or fever as directed by your caregiver.  You should avoid stressing the joint. Unless advised otherwise, avoid activities that put a lot of pressure on a knee joint, such as:  Jogging.  Bicycling.  Recreational climbing.  Hiking.  Laying down and elevating the leg/knee above the level of your heart can help to minimize swelling. SEEK MEDICAL CARE IF:   You have repeated  or worsening swelling.  There is drainage from the puncture area.  You develop red streaking that extends above or below the site where the needle was inserted. SEEK IMMEDIATE MEDICAL CARE IF:   You develop a fever.  You have pain that gets worse even though you are taking pain medicine.  The area is red and warm,  and you have trouble moving the joint. MAKE SURE YOU:   Understand these instructions.  Will watch your condition.  Will get help right away if you are not doing well or get worse. Document Released: 01/17/2007 Document Revised: 01/18/2012 Document Reviewed: 10/14/2007 [facility] Patient Information 2013 [place], [place].

## 2013-03-23 NOTE — Progress Notes (Signed)
Subjective:    Patient ID: Emily Bentley, female    DOB: 28-Dec-1934, 78 y.o.   MRN: [id number]  HPI  Patient of Dr. Elyn Peers presents to the office today with right knee pain.  Dr. Debby Bud diagnosed the patient with a Baker's cyst of the right knee 01/05/13.  At this time the patient was prescribed Naproxen and RICE protocol.  Patient states that this therapy has not been working.  Patient states that Friday May 9th she experienced swelling of the leg from the knee down.  She has tried ice, naproxen, and tylenol at home with little relief.  She thinks that she may have aggravated her knee when she was gardening.   Past Medical History  Diagnosis Date  . Watermelon stomach   . Peptic stricture of esophagus   . GERD (gastroesophageal reflux disease)   . Chronic allergic rhinitis     with asthmatic component  . Hormone replacement therapy (postmenopausal)   . History of PSVT (paroxysmal supraventricular tachycardia)   . History of pneumonia   . History of colon polyps   . Hearing impairment   . History of bursitis     right shoulder  . Hiatal hernia   . Diverticulosis of colon (without mention of hemorrhage)   . IBS (irritable bowel syndrome)    Family History  Problem Relation Age of Onset  . Diabetes Mother   . Coronary artery disease Father     CABG x 5 in 1985  . Hypertension Father   . Uterine cancer Mother   . Colon cancer Neg Hx       Review of Systems  Constitutional: Negative for fever, chills and fatigue.  Respiratory: Negative for chest tightness and shortness of breath.   Cardiovascular: Negative for chest pain, palpitations and leg swelling.  Musculoskeletal: Positive for joint swelling and arthralgias. Negative for back pain.  All other systems reviewed and are negative.       Objective:   Physical Exam  Nursing note and vitals reviewed. Constitutional: She is oriented to person, place, and time. She appears well-developed and well-nourished.  HENT:   Head: Normocephalic.  Eyes: Conjunctivae are normal. No scleral icterus.  Neck: Normal range of motion. Neck supple.  Cardiovascular: Normal rate.   Pulses:      Dorsalis pedis pulses are 2+ on the right side.       Posterior tibial pulses are 2+ on the right side.  Pulmonary/Chest: Effort normal.  Musculoskeletal:  Large effusion of the right knee.  No noted redness or abnormal warmth.  Tenderness to palpation of the posterior aspect of the knee, with palpable cyst near the medial aspect of the posterior compartment of the knee.  Full active ROM of the knee, full active ROM of the ankle.  Mild decrease in hip flexion due to pain.  5/5 dorsiflexion, 5/5 plantar flexion, 5/5 knee flexion, 5/5 knee extension, 5/5 hip flexion.     Neurological: She is alert and oriented to person, place, and time. No sensory deficit.  Skin: Skin is warm and dry.  Psychiatric: She has a normal mood and affect. Her behavior is normal.        Assessment & Plan:  Patient presents to the office for follow-up of bakers cyst of the right knee.  1.  Baker's Cyst:  Risks and benefits for steroid injection were discussed with the patient and patient is amenable to trying.  Will inject the knee with 3 ml of 1% plain lidocaine and 1  ml of 80 mg depomedrol.  See Dr. Diamantina Monks note for procedure note.  If failure of this therapy will refer to orthopedics for further evaluation.  Patient encouraged to continue with ice and tylenol as needed for pain.   I have personally reviewed this case with PA student. I also personally examined this patient. I agree with history and findings as documented above. I reviewed, discussed and approve of the assessment and plan as listed above. Rene Paci, MD

## 2013-04-25 DIAGNOSIS — H903 Sensorineural hearing loss, bilateral: Secondary | ICD-10-CM | POA: Diagnosis not present

## 2013-06-29 ENCOUNTER — Ambulatory Visit (INDEPENDENT_AMBULATORY_CARE_PROVIDER_SITE_OTHER): Payer: Medicare Other | Admitting: Internal Medicine

## 2013-06-29 ENCOUNTER — Encounter: Payer: Self-pay | Admitting: Internal Medicine

## 2013-06-29 ENCOUNTER — Other Ambulatory Visit (INDEPENDENT_AMBULATORY_CARE_PROVIDER_SITE_OTHER): Payer: Medicare Other

## 2013-06-29 VITALS — BP 124/70 | HR 61 | Temp 96.5°F | Wt 183.8 lb

## 2013-06-29 DIAGNOSIS — I471 Supraventricular tachycardia, unspecified: Secondary | ICD-10-CM

## 2013-06-29 DIAGNOSIS — R0602 Shortness of breath: Secondary | ICD-10-CM

## 2013-06-29 DIAGNOSIS — Z23 Encounter for immunization: Secondary | ICD-10-CM

## 2013-06-29 DIAGNOSIS — D126 Benign neoplasm of colon, unspecified: Secondary | ICD-10-CM | POA: Diagnosis not present

## 2013-06-29 DIAGNOSIS — K219 Gastro-esophageal reflux disease without esophagitis: Secondary | ICD-10-CM

## 2013-06-29 DIAGNOSIS — R0789 Other chest pain: Secondary | ICD-10-CM

## 2013-06-29 DIAGNOSIS — Z2911 Encounter for prophylactic immunotherapy for respiratory syncytial virus (RSV): Secondary | ICD-10-CM

## 2013-06-29 DIAGNOSIS — K319 Disease of stomach and duodenum, unspecified: Secondary | ICD-10-CM

## 2013-06-29 DIAGNOSIS — M899 Disorder of bone, unspecified: Secondary | ICD-10-CM

## 2013-06-29 DIAGNOSIS — Z0000 Encounter for general adult medical examination without abnormal findings: Secondary | ICD-10-CM

## 2013-06-29 LAB — COMPREHENSIVE METABOLIC PANEL
AST: 25 U/L (ref 0–37)
Albumin: 3.7 g/dL (ref 3.5–5.2)
Alkaline Phosphatase: 72 U/L (ref 39–117)
BUN: 13 mg/dL (ref 6–23)
Creatinine, Ser: 1 mg/dL (ref 0.4–1.2)
Potassium: 4 mEq/L (ref 3.5–5.1)
Total Bilirubin: 0.9 mg/dL (ref 0.3–1.2)

## 2013-06-29 LAB — LIPID PANEL
Cholesterol: 182 mg/dL (ref 0–200)
HDL: 50.4 mg/dL (ref >39.00)
LDL Cholesterol: 109 mg/dL — ABNORMAL HIGH (ref 0–99)
Triglycerides: 111 mg/dL (ref 0.0–149.0)
VLDL: 22.2 mg/dL (ref 0.0–40.0)

## 2013-06-29 LAB — HEPATIC FUNCTION PANEL
ALT: 11 U/L (ref 0–35)
Bilirubin, Direct: 0.1 mg/dL (ref 0.0–0.3)
Total Protein: 7.2 g/dL (ref 6.0–8.3)

## 2013-06-29 NOTE — Patient Instructions (Addendum)
1. Chest wall pain - EKG is normal. Heart exam is normal. Chest was muscles are very tender to touch. Your pain is from sore muscles Plan Rub of choice  Heat  2. Reflux with swallow trouble - sounds like an early stricture of the esophagus Plan Consult with Dr. Marina Goodell  3. H/o adenomatous colon polyp in 2009 Plan For follow up colonoscopy.  4. Arthritis - general osteoarthritis Plan Heat helps, e.g. Warm shower in the AM  OK to try celebrex 200 mg once a day  5. Health maintenance - up to date with mammography, will schedule colonoscopy, immunizations are up to date and shingles vaccine today. Plan Routine lab: cholesterol, thryoid, kidney function.

## 2013-06-29 NOTE — Progress Notes (Signed)
Subjective:    Patient ID: Emily Bentley, female    DOB: 1934/12/23, 78 y.o.   MRN: [id number]  HPI Emily Bentley presents for a 2 day h/o left chest pain that extends to the axilla and upper chest. She has no diaphoresis, SOB, radiation to neck. No exertional pain. She has been doing increased physical work. NSAIDs don't seem to help.  She has increasing morning gel and diffuse joint and back pain.  She reports that she is having some difficulty with swallow and has a h/o GERD. She thinks she may need to see Dr. Marina Goodell. She is due for follow up colonoscopy with a adenomatous polyp removed in 2009.   The patient is here for annual Medicare wellness examination and management of other chronic and acute problems.   The risk factors are reflected in the social history.  The roster of all physicians providing medical care to patient - is listed in the Snapshot section of the chart.  Activities of daily living:  The patient is 100% inedpendent in all ADLs: dressing, toileting, feeding as well as independent mobility  Home safety : The patient has smoke detectors in the home. They wear seatbelts. No firearms at home  There is no violence in the home.   There is no risks for hepatitis, STDs or HIV. There is no  history of blood transfusion. They have no travel history to infectious disease endemic areas of the world.  The patient has seen their dentist in the last six month. They have seen their eye doctor in the last year. They admit to hearing difficulty and have not had audiologic testing in the last year.    They do not  have excessive sun exposure. Discussed the need for sun protection: hats, long sleeves and use of sunscreen if there is significant sun exposure.   Diet: the importance of a healthy diet is discussed. They do have a healthy  diet.  The patient has a regular exercise program.  The benefits of regular aerobic exercise were discussed.  Depression screen: there are no signs  or vegative symptoms of depression- irritability, change in appetite, anhedonia, sadness/tearfullness.  Cognitive assessment: the patient manages all their financial and personal affairs and is actively engaged. They could relate day,date,year and events; recalled 3/3 objects at 3 minutes; performed clock-face test normally.  The following portions of the patient's history were reviewed and updated as appropriate: allergies, current medications, past family history, past medical history,  past surgical history, past social history  and problem list.  Vision, hearing, body mass index were assessed and reviewed.   During the course of the visit the patient was educated and counseled about appropriate screening and preventive services including : fall prevention , diabetes screening, nutrition counseling, colorectal cancer screening, and recommended immunizations.  Past Medical History  Diagnosis Date  . Watermelon stomach   . Peptic stricture of esophagus   . GERD (gastroesophageal reflux disease)   . Chronic allergic rhinitis     with asthmatic component  . Hormone replacement therapy (postmenopausal)   . History of PSVT (paroxysmal supraventricular tachycardia)   . History of pneumonia   . History of colon polyps   . Hearing impairment   . History of bursitis     right shoulder  . Hiatal hernia   . Diverticulosis of colon (without mention of hemorrhage)   . IBS (irritable bowel syndrome)    Past Surgical History  Procedure Laterality Date  . Polypectomy    .  Appendectomy    . Urethral dilatations    . Tubal ligation    . Abdominal hysterectomy    . Cholecystectomy    . Ventricular ablation surgery    . Cochlear implant Left Oct '14    Done at [facility] - great results   Family History  Problem Relation Age of Onset  . Diabetes Mother   . Coronary artery disease Father     CABG x 5 in 1985  . Hypertension Father   . Uterine cancer Mother   . Colon cancer Neg Hx     History   Social History  . Marital Status: Widowed    Spouse Name: N/A    Number of Children: 4  . Years of Education: N/A   Occupational History  .  [employer]   Social History Main Topics  . Smoking status: Never Smoker   . Smokeless tobacco: Never Used  . Alcohol Use: No  . Drug Use: No  . Sexual Activity: Not on file   Other Topics Concern  . 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 ALPRAZolam (XANAX) 0.5 MG tablet Take 1 tablet (0.5 mg total) by mouth at bedtime as needed.  30 tablet  5  . diclofenac (VOLTAREN) 75 MG EC tablet Take 1 tablet (75 mg total) by mouth 2 (two) times daily with a meal.  60 tablet  0  . Magnesium Hydroxide (MILK OF MAGNESIA PO) Take by mouth as directed.        . Multiple Vitamins-Minerals (MULTIVITAL PLATINUM PO) Take 1 tablet by mouth daily.        . Multiple Vitamins-Minerals (OCUVITE PO) Take 1 tablet by mouth daily.        . pantoprazole (PROTONIX) 40 MG tablet take 1 tablet by mouth once daily  30 tablet  11   No current facility-administered medications on file prior to visit.     Review of Systems System review is negative for any constitutional, cardiac, pulmonary, GI or neuro symptoms or complaints other than as described in the HPI.     Objective:   Physical Exam Filed Vitals:   06/29/13 1312  BP: 124/70  Pulse: 61  Temp: 96.5 F (35.8 C)   Wt Readings from Last 3 Encounters:  06/29/13 183 lb 12.8 oz (83.371 kg)  01/05/13 181 lb (82.101 kg)  07/08/12 182 lb 1.9 oz (82.609 kg)   Gen'l: well nourished, well developed AA Woman in no distress HEENT - [place]/AT, EACs/TMs normal, oropharynx with native dentition in good condition,  no buccal or palatal lesions, posterior pharynx clear, mucous membranes moist. C&S clear, PERRLA, fundi - normal Neck - supple, no thyromegaly Nodes- negative submental, cervical, supraclavicular regions Chest - no deformity, no CVAT Lungs - clear without rales, wheezes. No increased work of breathing Breast - deferred to mammography Cardiovascular - regular rate and rhythm, quiet precordium, no murmurs, rubs or gallops, 2+ radial, DP and PT pulses Abdomen - BS+ x 4, no HSM, no guarding or rebound or tenderness Pelvic - deferred  Rectal - deferred  Extremities - no clubbing, cyanosis, edema or deformity.  Neuro - A&O  x 3, CN II-XII normal, motor strength normal and equal, DTRs 2+ and symmetrical biceps, radial, and patellar tendons. Cerebellar - no tremor, no rigidity, fluid movement and normal gait. Derm - Head, neck, back, abdomen and extremities without suspicious lesions  12 lead EKG - no acute changes.     Recent Results (from the past 2160 hour(s))  HEPATIC FUNCTION PANEL     Status: None   Collection Time    06/29/13  1:53 PM      Result Value Range   Total Bilirubin 0.9  0.3 - 1.2 mg/dL   Bilirubin, Direct 0.1  0.0 - 0.3 mg/dL   Alkaline Phosphatase 72  39 - 117 U/L   AST 25  0 - 37 U/L   ALT 11  0 - 35 U/L   Total Protein 7.2  6.0 - 8.3 g/dL   Albumin 3.7  3.5 - 5.2 g/dL  COMPREHENSIVE METABOLIC PANEL     Status: None   Collection Time    06/29/13  1:53 PM      Result Value Range   Sodium 138  135 - 145 mEq/L   Potassium 4.0  3.5 - 5.1 mEq/L   Chloride 106  96 - 112 mEq/L   CO2 27  19 - 32 mEq/L   Glucose, Bld 93  70 - 99 mg/dL   BUN 13  6 - 23 mg/dL   Creatinine, Ser 1.0  0.4 - 1.2 mg/dL   Total Bilirubin 0.9  0.3 - 1.2 mg/dL   Alkaline Phosphatase 72  39 - 117 U/L   AST 25  0 - 37 U/L   ALT 11  0 - 35 U/L   Total Protein 7.2  6.0 - 8.3 g/dL   Albumin 3.7  3.5 - 5.2 g/dL   Calcium 9.2  8.4 - 11.9 mg/dL   GFR 14.78  >29.56 mL/min  TSH     Status: None    Collection Time    06/29/13  1:53 PM      Result Value Range   TSH 1.40  0.35 - 5.50 uIU/mL  LIPID PANEL     Status: Abnormal   Collection Time    06/29/13  1:53 PM      Result Value Range   Cholesterol 182  0 - 200 mg/dL   Comment: ATP III Classification       Desirable:  < 200 mg/dL               Borderline High:  200 - 239 mg/dL          High:  > = 213 mg/dL   Triglycerides 086.5  0.0 - 149.0 mg/dL   Comment: Normal:  <784 mg/dLBorderline High:  150 - 199 mg/dL   HDL 69.62  >95.28 mg/dL   VLDL 41.3  0.0 - 24.4 mg/dL   LDL Cholesterol 010 (*) 0 - 99 mg/dL   Total CHOL/HDL Ratio 4     Comment:                Men          Women1/2 Average Risk     3.4          3.3Average Risk          5.0          4.42X Average Risk          9.6          7.13X Average Risk  15.0          11.0                      HEMOGLOBIN AND HEMATOCRIT, BLOOD     Status: None   Collection Time    06/29/13  1:53 PM      Result Value Range   Hemoglobin 13.3  12.0 - 15.0 g/dL   HCT 16.1  09.6 - 04.5 %    Assessment & Plan:

## 2013-06-30 ENCOUNTER — Other Ambulatory Visit: Payer: Self-pay | Admitting: Internal Medicine

## 2013-07-02 DIAGNOSIS — Z0000 Encounter for general adult medical examination without abnormal findings: Secondary | ICD-10-CM | POA: Insufficient documentation

## 2013-07-02 NOTE — Assessment & Plan Note (Signed)
Prior history of PSVT treated with ablation procedure in '05. Does not report any recent episodes of PST. 12 Lead EKG today is normal.

## 2013-07-02 NOTE — Assessment & Plan Note (Signed)
Interval history notable for increasing morning gel in her hands, mild dysphagia which is recurrent problem she associates with known GERD. She has had minor chest discomfort the past several days w/o associated symptoms. EKG is normal. Limited physical exam is normal except for weight - Body mass index is 30.59 kg/(m^2). She is current with colorectal and breast cancer screening. Immunizations are up to date with shingles vaccine given today.  In summary - A pleasant woman who is medically stable. She will make an appointment with Dr. Marina Goodell for her swallow difficulty. She will return as needed.

## 2013-07-02 NOTE — Assessment & Plan Note (Signed)
Last DEXA Dec '10 - normal bone density. Low risk patient - consider repeat DEXA but not absolutely indicated.

## 2013-07-03 ENCOUNTER — Telehealth: Payer: Self-pay | Admitting: *Deleted

## 2013-07-03 NOTE — Telephone Encounter (Signed)
Ok to refill: celebrex 200 mg 1 po qd, #30 refill x 5

## 2013-07-03 NOTE — Telephone Encounter (Signed)
Pt called requesting a Rx for Celebrex 200mg .  Please advise

## 2013-07-04 ENCOUNTER — Other Ambulatory Visit: Payer: Self-pay | Admitting: *Deleted

## 2013-07-04 MED ORDER — CELECOXIB 200 MG PO CAPS: 200.0000 mg | ORAL_CAPSULE | Freq: Every day | ORAL | Status: DC

## 2013-07-04 NOTE — Telephone Encounter (Signed)
Leftmsg. Rx sent.

## 2013-07-31 ENCOUNTER — Ambulatory Visit (INDEPENDENT_AMBULATORY_CARE_PROVIDER_SITE_OTHER): Payer: Medicare Other | Admitting: Internal Medicine

## 2013-07-31 ENCOUNTER — Encounter: Payer: Self-pay | Admitting: Internal Medicine

## 2013-07-31 ENCOUNTER — Ambulatory Visit: Payer: Medicare Other | Admitting: Internal Medicine

## 2013-07-31 VITALS — BP 122/80 | HR 60 | Ht 63.0 in | Wt 184.4 lb

## 2013-07-31 DIAGNOSIS — R141 Gas pain: Secondary | ICD-10-CM

## 2013-07-31 DIAGNOSIS — R1084 Generalized abdominal pain: Secondary | ICD-10-CM | POA: Diagnosis not present

## 2013-07-31 DIAGNOSIS — K222 Esophageal obstruction: Secondary | ICD-10-CM | POA: Diagnosis not present

## 2013-07-31 DIAGNOSIS — Z860100 Personal history of colon polyps, unspecified: Secondary | ICD-10-CM

## 2013-07-31 DIAGNOSIS — K219 Gastro-esophageal reflux disease without esophagitis: Secondary | ICD-10-CM

## 2013-07-31 MED ORDER — MOVIPREP 100 G PO SOLR: 1.0000 | Freq: Once | ORAL | Status: DC

## 2013-07-31 NOTE — Progress Notes (Signed)
HISTORY OF PRESENT ILLNESS:  Emily Bentley is a 78 y.o. female with multiple significant medical problems as listed below. She was last evaluated in the office 12/07/2011 for followup regarding problems with abdominal pain, bloating, constipation, and gas. At that time she was doing better on MiraLax, PPI, and anti-gas measures. She presents today with several issues. First, recent problems with worsening reflux symptoms. For this she increased her pantoprazole twice daily for one week. Symptoms improved and she is currently taking pantoprazole 40 mg once daily. Next, she reports some worsening of intermittent solid food dysphagia. She thinks she may need esophageal dilation. She last underwent esophageal dilation to a maximal diameter of 20 mm in October 2008. Next, she reports some problems with bloating and constipation. She is not using MiraLax, but rather milk of magnesia. She does have a history of adenomatous colon polyps. Her last colonoscopy in August of 2009 with diverticulosis and a few diminutive adenomas.  REVIEW OF SYSTEMS:  All non-GI ROS negative except for arthritis, back pain, hearing impairment, urinary leakage  Past Medical History  Diagnosis Date  . Watermelon stomach   . Peptic stricture of esophagus   . GERD (gastroesophageal reflux disease)   . Chronic allergic rhinitis     with asthmatic component  . Hormone replacement therapy (postmenopausal)   . History of PSVT (paroxysmal supraventricular tachycardia)   . History of pneumonia   . History of colon polyps   . Hearing impairment   . History of bursitis     right shoulder  . Hiatal hernia   . Diverticulosis of colon (without mention of hemorrhage)   . IBS (irritable bowel syndrome)     Past Surgical History  Procedure Laterality Date  . Polypectomy    . Appendectomy    . Urethral dilatations    . Tubal ligation    . Abdominal hysterectomy    . Cholecystectomy    . Ventricular ablation surgery    .  Cochlear implant Left Oct '14    Done at [facility] - great results    Social History MYLANI GENTRY  reports that she has never smoked. She has never used smokeless tobacco. She reports that she does not drink alcohol or use illicit drugs.  family history includes Coronary artery disease in her father; Diabetes in her mother; Hypertension in her father; Uterine cancer in her mother. There is no history of Colon cancer.  Allergies  Allergen Reactions  . Sulfamethoxazole W-Trimethoprim        PHYSICAL EXAMINATION: Vital signs: BP 122/80  Pulse 60  Ht 5\' 3"  (1.6 m)  Wt 184 lb 6 oz (83.632 kg)  BMI 32.67 kg/m2  Constitutional: generally well-appearing, no acute distress Psychiatric: alert and oriented x3, cooperative Eyes: extraocular movements intact, anicteric, conjunctiva pink Mouth: oral pharynx moist, no lesions Neck: supple no lymphadenopathy Cardiovascular: heart regular rate and rhythm, no murmur Lungs: clear to auscultation bilaterally Abdomen: soft, obese, nontender, nondistended, no obvious ascites, no peritoneal signs, normal bowel sounds, no organomegaly Rectal: Deferred until colonoscopy Extremities: no lower extremity edema bilaterally Skin: no lesions on visible extremities Neuro: No focal deficits.  ASSESSMENT:  #1. GERD. Recent problems with breakthrough. Improved after temporary increase in PPI dosage #2. Recurrent dysphagia likely secondary to peptic stricture #3. Chronic constipation and bloating. Ongoing. #4. History of adenomatous colon polyps due for surveillance   PLAN:  #1. Reflux precautions #2. Continue PPI therapy #3. Schedule upper endoscopy with esophageal dilation.The nature of the procedure,  as well as the risks, benefits, and alternatives were carefully and thoroughly reviewed with the patient. Ample time for discussion and questions allowed. The patient understood, was satisfied, and agreed to proceed. #4. Schedule surveillance  colonoscopy.The nature of the procedure, as well as the risks, benefits, and alternatives were carefully and thoroughly reviewed with the patient. Ample time for discussion and questions allowed. The patient understood, was satisfied, and agreed to proceed. #5. Movi prep prescribed. The patient instructed on its use #6. For constipation resume MiraLax. Instructed on how to titrate dose

## 2013-07-31 NOTE — Patient Instructions (Addendum)

## 2013-08-09 HISTORY — PX: COCHLEAR IMPLANT: SUR684

## 2013-08-23 ENCOUNTER — Telehealth: Payer: Self-pay

## 2013-08-23 NOTE — Telephone Encounter (Signed)
Spoke with pt and rescheduled her appt for tomorrow at 1:30pm. Prep instructions adjusted and reviewed with pt. Pt verbalized understanding and knows to be here at 12:30pm.

## 2013-08-24 ENCOUNTER — Encounter: Payer: Medicare Other | Admitting: Internal Medicine

## 2013-08-24 ENCOUNTER — Ambulatory Visit (AMBULATORY_SURGERY_CENTER): Payer: Medicare Other | Admitting: Internal Medicine

## 2013-08-24 ENCOUNTER — Encounter: Payer: Self-pay | Admitting: Internal Medicine

## 2013-08-24 VITALS — BP 158/76 | HR 62 | Temp 97.4°F | Resp 16 | Ht 63.0 in | Wt 184.0 lb

## 2013-08-24 DIAGNOSIS — Z860100 Personal history of colon polyps, unspecified: Secondary | ICD-10-CM

## 2013-08-24 DIAGNOSIS — R1310 Dysphagia, unspecified: Secondary | ICD-10-CM | POA: Diagnosis not present

## 2013-08-24 DIAGNOSIS — K219 Gastro-esophageal reflux disease without esophagitis: Secondary | ICD-10-CM | POA: Diagnosis not present

## 2013-08-24 DIAGNOSIS — K589 Irritable bowel syndrome without diarrhea: Secondary | ICD-10-CM | POA: Diagnosis not present

## 2013-08-24 DIAGNOSIS — R109 Unspecified abdominal pain: Secondary | ICD-10-CM | POA: Diagnosis not present

## 2013-08-24 DIAGNOSIS — K222 Esophageal obstruction: Secondary | ICD-10-CM | POA: Diagnosis not present

## 2013-08-24 DIAGNOSIS — Z8679 Personal history of other diseases of the circulatory system: Secondary | ICD-10-CM | POA: Diagnosis not present

## 2013-08-24 MED ORDER — SODIUM CHLORIDE 0.9 % IV SOLN: 500.0000 mL | INTRAVENOUS | Status: DC

## 2013-08-24 NOTE — Progress Notes (Signed)
Charted on wrong pt-adm

## 2013-08-24 NOTE — Op Note (Signed)
[facility] [address] [place], [address]   ENDOSCOPY PROCEDURE REPORT  PATIENT: Emily Bentley, Emily Bentley  MR#: [id number] BIRTHDATE: 1935/04/09 , 77  yrs. old GENDER: Female ENDOSCOPIST: Roxy Cedar, MD REFERRED BY:  Surveillance Program Recall PROCEDURE DATE:  08/24/2013 PROCEDURE:  EGD, diagnostic and Maloney dilation of esophagus  -54 French Maloney ASA CLASS:     Class II INDICATIONS:  Dysphagia.   Therapeutic procedure. MEDICATIONS: MAC sedation, administered by CRNA and propofol (Diprivan) 130mg  IV TOPICAL ANESTHETIC: Cetacaine Spray  DESCRIPTION OF PROCEDURE: After the risks benefits and alternatives of the procedure were thoroughly explained, informed consent was obtained.  The Pentax EG-[id number] [id number] endoscope was introduced through the mouth and advanced to the second portion of the duodenum. Without limitations.  The instrument was slowly withdrawn as the mucosa was fully examined.      EXAM:The esophagus was normal except for a large caliber ring at the gastroesophageal junction (35 cm from the teeth).  The stomach and duodenum to the second portion were normal.  Retroflexed views revealed a hiatal hernia.     The scope was then withdrawn from the patient and the procedure completed.  THERAPY: A 54 French Maloney dilator was passed into the esophagus without resistance. No heme upon retraction. Tolerated well  COMPLICATIONS: There were no complications. ENDOSCOPIC IMPRESSION: 1. Distal esophageal ring status post dilation 2. GERD.  RECOMMENDATIONS: 1.  Clear liquids until 4 PM, then soft foods rest of day.  Resume prior diet tomorrow. 2.  Continue current meds  REPEAT EXAM:  eSigned:  Roxy Cedar, MD 08/24/2013 2:22 PM   [id number] Esther Hardy, MD and The Patient

## 2013-08-24 NOTE — Progress Notes (Signed)
Lidocaine-40mg IV prior to Propofol InductionPropofol given over incremental dosages 

## 2013-08-24 NOTE — Progress Notes (Signed)
Patient did not experience any of the following events: a burn prior to discharge; a fall within the facility; wrong site/side/patient/procedure/implant event; or a hospital transfer or hospital admission upon discharge from the facility. (G8907) Patient did not have preoperative order for IV antibiotic SSI prophylaxis. (G8918)  

## 2013-08-24 NOTE — Patient Instructions (Addendum)
YOU HAD AN ENDOSCOPIC PROCEDURE TODAY AT THE [facility]: Refer to the procedure report that was given to you for any specific questions about what was found during the examination.  If the procedure report does not answer your questions, please call your gastroenterologist to clarify.  If you requested that your care partner not be given the details of your procedure findings, then the procedure report has been included in a sealed envelope for you to review at your convenience later.  YOU SHOULD EXPECT: Some feelings of bloating in the abdomen. Passage of more gas than usual.  Walking can help get rid of the air that was put into your GI tract during the procedure and reduce the bloating. If you had a lower endoscopy (such as a colonoscopy or flexible sigmoidoscopy) you may notice spotting of blood in your stool or on the toilet paper. If you underwent a bowel prep for your procedure, then you may not have a normal bowel movement for a few days.  DIET: See dilation diet handout- Drink plenty of fluids but you should avoid alcoholic beverages for 24 hours.  ACTIVITY: Your care partner should take you home directly after the procedure.  You should plan to take it easy, moving slowly for the rest of the day.  You can resume normal activity the day after the procedure however you should NOT DRIVE or use heavy machinery for 24 hours (because of the sedation medicines used during the test).    SYMPTOMS TO REPORT IMMEDIATELY: A gastroenterologist can be reached at any hour.  During normal business hours, 8:30 AM to 5:00 PM Monday through Friday, call [phone].  After hours and on weekends, please call the GI answering service at [phone] who will take a message and have the physician on call contact you.   Following lower endoscopy (colonoscopy or flexible sigmoidoscopy):  Excessive amounts of blood in the stool  Significant tenderness or worsening of abdominal pains  Swelling of  the abdomen that is new, acute  Fever of 100F or higher  Following upper endoscopy (EGD)  Vomiting of blood or coffee ground material  New chest pain or pain under the shoulder blades  Painful or persistently difficult swallowing  New shortness of breath  Fever of 100F or higher  Black, tarry-looking stools  FOLLOW UP: If any biopsies were taken you will be contacted by phone or by letter within the next 1-3 weeks.  Call your gastroenterologist if you have not heard about the biopsies in 3 weeks.  Our staff will call the home number listed on your records the next business day following your procedure to check on you and address any questions or concerns that you may have at that time regarding the information given to you following your procedure. This is a courtesy call and so if there is no answer at the home number and we have not heard from you through the emergency physician on call, we will assume that you have returned to your regular daily activities without incident.  SIGNATURES/CONFIDENTIALITY: You and/or your care partner have signed paperwork which will be entered into your electronic medical record.  These signatures attest to the fact that that the information above on your After Visit Summary has been reviewed and is understood.  Full responsibility of the confidentiality of this discharge information lies with you and/or your care-partner.  Diverticulosis, high fiber diet, dilation diet,stricture,GERD-handouts given  Will not need another screening colonoscopy due to age.  Resume  regular diet tomorrow 08/25/13

## 2013-08-24 NOTE — Op Note (Signed)
[facility] [address] [place], [address]   COLONOSCOPY PROCEDURE REPORT  PATIENT: Emily, Bentley  MR#: [id number] BIRTHDATE: 25-Jul-1935 , 77  yrs. old GENDER: Female ENDOSCOPIST: Roxy Cedar, MD REFERRED XB:[id number] Program Recall PROCEDURE DATE:  08/24/2013 PROCEDURE:   Colonoscopy, surveillance First Screening Colonoscopy - Avg.  risk and is 50 yrs.  old or older - No.  Prior Negative Screening - Now for repeat screening. N/A  History of Adenoma - Now for follow-up colonoscopy & has been > or = to 3 yrs.  Yes hx of adenoma.  Has been 3 or more years since last colonoscopy.  Polyps Removed Today? No.  Recommend repeat exam, <10 yrs? No. ASA CLASS:   Class II INDICATIONS:Patient's personal history of adenomatous colon polyps. Index 2004, followup 2009-small adenomas MEDICATIONS: MAC sedation, administered by CRNA and propofol (Diprivan) 100mg  IV  DESCRIPTION OF PROCEDURE:   After the risks benefits and alternatives of the procedure were thoroughly explained, informed consent was obtained.  A digital rectal exam revealed no abnormalities of the rectum.   The LB [id number]  endoscope was introduced through the anus and advanced to the cecum, which was identified by both the appendix and ileocecal valve. No adverse events experienced.   The quality of the prep was excellent, using MoviPrep  The instrument was then slowly withdrawn as the colon was fully examined.   COLON FINDINGS: Moderate diverticulosis was noted in the transverse colon and The finding was left colon.   The colon mucosa was otherwise normal.  Retroflexed views revealed no abnormalities. The time to cecum=3 minutes 18 seconds.  Withdrawal time=9 minutes 30 seconds.  The scope was withdrawn and the procedure completed. COMPLICATIONS: There were no complications.  ENDOSCOPIC IMPRESSION: 1.   Moderate diverticulosis was noted in the transverse colon and left colon 2.   The  colon mucosa was otherwise normal  RECOMMENDATIONS: 1.  Continue current colorectal surveillance recommendations  with a repeat colonoscopy in 10 years. 2.  Upper endoscopy today (see report)   eSigned:  Roxy Cedar, MD 08/24/2013 2:12 PM   cc: Jacques Navy, MD and The Patient

## 2013-08-24 NOTE — Progress Notes (Signed)
Called to room to assist during endoscopic procedure.  Patient ID and intended procedure confirmed with present staff. Received instructions for my participation in the procedure from the performing physician.  

## 2013-08-25 ENCOUNTER — Telehealth: Payer: Self-pay | Admitting: *Deleted

## 2013-08-25 NOTE — Telephone Encounter (Signed)
  Follow up Call-  Call back number 08/24/2013  Post procedure Call Back phone  # [phone]  Permission to leave phone message Yes     Patient questions:  Do you have a fever, pain , or abdominal swelling? no Pain Score  0 *  Have you tolerated food without any problems? yes  Have you been able to return to your normal activities? yes  Do you have any questions about your discharge instructions: Diet   no Medications  no Follow up visit  no  Do you have questions or concerns about your Care? no  Actions: * If pain score is 4 or above: No action needed, pain <4.

## 2013-10-09 ENCOUNTER — Other Ambulatory Visit: Payer: Self-pay | Admitting: Internal Medicine

## 2013-10-09 NOTE — Telephone Encounter (Signed)
Alprazolam called to pharmacy  

## 2013-10-19 ENCOUNTER — Telehealth: Payer: Self-pay

## 2013-10-19 DIAGNOSIS — H9190 Unspecified hearing loss, unspecified ear: Secondary | ICD-10-CM

## 2013-10-19 NOTE — Telephone Encounter (Signed)
Ok - order to [facility]

## 2013-10-19 NOTE — Telephone Encounter (Signed)
Phone call from [place] with [facility] [phone] needing an order for re-evaluation of cochlear implant

## 2013-10-19 NOTE — Telephone Encounter (Signed)
  Order next wed reevaluat to cochlear inplanct [phone] [facility]

## 2013-11-16 DIAGNOSIS — H903 Sensorineural hearing loss, bilateral: Secondary | ICD-10-CM | POA: Diagnosis not present

## 2013-11-16 DIAGNOSIS — Z9889 Other specified postprocedural states: Secondary | ICD-10-CM | POA: Diagnosis not present

## 2014-04-28 DIAGNOSIS — H903 Sensorineural hearing loss, bilateral: Secondary | ICD-10-CM | POA: Diagnosis not present

## 2014-06-04 DIAGNOSIS — H353190 Nonexudative age-related macular degeneration, unspecified eye, stage unspecified: Secondary | ICD-10-CM | POA: Diagnosis not present

## 2014-06-04 DIAGNOSIS — H5200 Hypermetropia, unspecified eye: Secondary | ICD-10-CM | POA: Diagnosis not present

## 2014-06-11 ENCOUNTER — Ambulatory Visit (INDEPENDENT_AMBULATORY_CARE_PROVIDER_SITE_OTHER): Payer: Medicare Other | Admitting: Family Medicine

## 2014-06-11 VITALS — BP 113/77 | HR 54 | Temp 97.6°F | Resp 16 | Ht 62.5 in | Wt 176.6 lb

## 2014-06-11 DIAGNOSIS — R5383 Other fatigue: Secondary | ICD-10-CM

## 2014-06-11 DIAGNOSIS — R5381 Other malaise: Secondary | ICD-10-CM

## 2014-06-11 DIAGNOSIS — R8290 Unspecified abnormal findings in urine: Secondary | ICD-10-CM

## 2014-06-11 DIAGNOSIS — J0100 Acute maxillary sinusitis, unspecified: Secondary | ICD-10-CM

## 2014-06-11 DIAGNOSIS — R319 Hematuria, unspecified: Secondary | ICD-10-CM

## 2014-06-11 DIAGNOSIS — R82998 Other abnormal findings in urine: Secondary | ICD-10-CM

## 2014-06-11 DIAGNOSIS — R42 Dizziness and giddiness: Secondary | ICD-10-CM

## 2014-06-11 LAB — COMPLETE METABOLIC PANEL WITH GFR
Albumin: 4 g/dL (ref 3.5–5.2)
BUN: 12 mg/dL (ref 6–23)
CO2: 27 mEq/L (ref 19–32)
GFR, Est African American: 66 mL/min
GFR, Est Non African American: 58 mL/min — ABNORMAL LOW
Glucose, Bld: 84 mg/dL (ref 70–99)
Sodium: 140 mEq/L (ref 135–145)
Total Bilirubin: 1 mg/dL (ref 0.2–1.2)
Total Protein: 7.2 g/dL (ref 6.0–8.3)

## 2014-06-11 LAB — COMPLETE METABOLIC PANEL WITHOUT GFR
ALT: 13 U/L (ref 0–35)
AST: 23 U/L (ref 0–37)
Alkaline Phosphatase: 82 U/L (ref 39–117)
Calcium: 9.3 mg/dL (ref 8.4–10.5)
Chloride: 105 meq/L (ref 96–112)
Creat: 0.96 mg/dL (ref 0.50–1.10)
Potassium: 4.7 meq/L (ref 3.5–5.3)

## 2014-06-11 LAB — POCT URINALYSIS DIPSTICK
Bilirubin, UA: NEGATIVE
Glucose, UA: NEGATIVE
Ketones, UA: NEGATIVE
Nitrite, UA: NEGATIVE
Protein, UA: NEGATIVE
Spec Grav, UA: 1.01
Urobilinogen, UA: 0.2
pH, UA: 7

## 2014-06-11 LAB — POCT CBC
Granulocyte percent: 64.7 % (ref 37–80)
HCT, POC: 45 % (ref 37.7–47.9)
Hemoglobin: 14.3 g/dL (ref 12.2–16.2)
Lymph, poc: 2 (ref 0.6–3.4)
MCH, POC: 26.5 pg — AB (ref 27–31.2)
MCHC: 31.7 g/dL — AB (ref 31.8–35.4)
MCV: 83.4 fL (ref 80–97)
MID (cbc): 0.3 (ref 0–0.9)
MPV: 7.1 fL (ref 0–99.8)
POC Granulocyte: 4.2 (ref 2–6.9)
POC LYMPH PERCENT: 30.1 %L (ref 10–50)
POC MID %: 5.2 % (ref 0–12)
Platelet Count, POC: 214 10*3/uL (ref 142–424)
RBC: 5.4 M/uL (ref 4.04–5.48)
RDW, POC: 13.9 %
WBC: 6.5 10*3/uL (ref 4.6–10.2)

## 2014-06-11 LAB — POCT UA - MICROSCOPIC ONLY
Bacteria, U Microscopic: NEGATIVE
Casts, Ur, LPF, POC: NEGATIVE
Crystals, Ur, HPF, POC: NEGATIVE
Mucus, UA: NEGATIVE
Yeast, UA: NEGATIVE

## 2014-06-11 MED ORDER — AMOXICILLIN 500 MG PO CAPS: 500.0000 mg | ORAL_CAPSULE | Freq: Three times a day (TID) | ORAL | Status: DC

## 2014-06-11 NOTE — Progress Notes (Signed)
 Chief Complaint:  Chief Complaint  Patient presents with  . Dizziness    x1 week; pt says she gets dizzy almost every morning and feels as though she will pass out;   . Otalgia    x1 week; both ears feel stopped up;    . other    x1 week; pt says each side of neck feels swollen    HPI: Emily Bentley is a 78 y.o. female who is here for  1. 1 week history of "heavy headedness", mostly when she is getting out of bed, she has some equilibrium problems and she is fine if she sits up for awhile. Her doctor has retired, Dr Crissie Sickles, she will see her new PCP on Sept 3.  Ears are stopped up, her head is stopped up. She has tried saline twice in the week and it id not help. Neg for CP, SOB, or palpitations.  2. Feels fatigue for the last 1 month. She deneis any urinary sxs, she denies any fevers or chills. 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    Past Medical History  Diagnosis Date  . Watermelon stomach   . Peptic stricture of esophagus   . GERD (gastroesophageal reflux disease)   . Chronic allergic rhinitis     with asthmatic component  . Hormone replacement therapy (postmenopausal)   . History of PSVT (paroxysmal supraventricular tachycardia)   . History of pneumonia   . History of colon polyps   . Hearing impairment   . History of bursitis     right shoulder  . Hiatal hernia   . Diverticulosis of colon (without mention of hemorrhage)   . IBS (irritable bowel syndrome)   . Allergy   . Arthritis   . Asthma   . Cataract    Past Surgical History  Procedure Laterality Date  . Polypectomy    . Appendectomy    . Urethral dilatations    . Tubal ligation    . Abdominal hysterectomy    . Cholecystectomy    . Ventricular ablation surgery    .  Cochlear implant Left Oct '14    Done at [facility] - great results  . Eye surgery     History   Social History  . Marital Status: Widowed    Spouse Name: N/A    Number of Children: 4  . Years of Education: N/A   Occupational History  .  [employer]   Social History Main Topics  . Smoking status: Never Smoker   . Smokeless tobacco: Never Used  . Alcohol Use: No  . Drug Use: No  . Sexual Activity: None   Other Topics Concern  . 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 Diabetes Mother   . Coronary artery disease Father     CABG x 5 in 1985  . Hypertension Father   . Uterine cancer Mother   .  Colon cancer Neg Hx    Allergies  Allergen Reactions  . Sulfamethoxazole-Trimethoprim    Prior to Admission medications   Medication Sig Start Date End Date Taking? Authorizing Provider  Acetaminophen (TYLENOL EXTRA STRENGTH PO) Take by mouth.   Yes Historical Provider, MD  ALPRAZolam Duanne Moron) 0.5 MG tablet take 1 tablet by mouth at bedtime if needed 10/09/13  Yes Neena Rhymes, MD  celecoxib (CELEBREX) 200 MG capsule Take 1 capsule (200 mg total) by mouth daily. 07/04/13  Yes Neena Rhymes, MD  cetirizine (ZYRTEC) 5 MG tablet Take 5 mg by mouth daily.   Yes Historical Provider, MD  Dextromethorphan-Guaifenesin [facility] DM PO) Take by mouth.   Yes Historical Provider, MD  Magnesium Hydroxide (MILK OF MAGNESIA PO) Take by mouth as directed.     Yes Historical Provider, MD  Multiple Vitamins-Minerals (MULTIVITAL PLATINUM PO) Take 1 tablet by mouth daily.     Yes Historical Provider, MD  Multiple Vitamins-Minerals (OCUVITE PO) Take 1 tablet by mouth daily.     Yes Historical Provider, MD  pantoprazole (PROTONIX) 40 MG tablet take 1  tablet by mouth once daily 06/30/13  Yes Neena Rhymes, MD     ROS: The patient denies fevers, chills, night sweats, unintentional weight loss, chest pain, palpitations, wheezing, dyspnea on exertion, nausea, vomiting, abdominal pain, dysuria, hematuria, melena, numbness, , or tingling. + weakness, dizziness  All other systems have been reviewed and were otherwise negative with the exception of those mentioned in the HPI and as above.    PHYSICAL EXAM: Filed Vitals:   06/11/14 0844  BP: 118/80  Pulse: 81  Temp: 97.6 F (36.4 C)  Resp: 16   Filed Vitals:   06/11/14 0844  Height: 5' 2.5" (1.588 m)  Weight: 176 lb 9.6 oz (80.105 kg)   Body mass index is 31.77 kg/(m^2).  General: Alert, no acute distress HEENT:  Normocephalic, atraumatic, oropharynx patent. EOMI, PERRLA, fundo exam normal.  Cardiovascular:  Regular rate and rhythm, no rubs murmurs or gallops.  No Carotid bruits, radial pulse intact. No pedal edema.  Respiratory: Clear to auscultation bilaterally.  No wheezes, rales, or rhonchi.  No cyanosis, no use of accessory musculature GI: No organomegaly, abdomen is soft and non-tender, positive bowel sounds.  No masses. Skin: No rashes. Neurologic: Facial musculature symmetric. Cn 2-12 grossly nl. ROmberg neg, neg nystagmus with dix hallpike.  Psychiatric: Patient is appropriate throughout our interaction. Lymphatic: No cervical lymphadenopathy Musculoskeletal: Gait intact.   LABS: Results for orders placed in visit on 06/11/14  POCT CBC      Result Value Ref Range   WBC 6.5  4.6 - 10.2 K/uL   Lymph, poc 2.0  0.6 - 3.4   POC LYMPH PERCENT 30.1  10 - 50 %L   MID (cbc) 0.3  0 - 0.9   POC MID % 5.2  0 - 12 %M   POC Granulocyte 4.2  2 - 6.9   Granulocyte percent 64.7  37 - 80 %G   RBC 5.40  4.04 - 5.48 M/uL   Hemoglobin 14.3  12.2 - 16.2 g/dL   HCT, POC 45.0  37.7 - 47.9 %   MCV 83.4  80 - 97 fL   MCH, POC 26.5 (*) 27 - 31.2 pg   MCHC 31.7 (*) 31.8 - 35.4 g/dL    RDW, POC 13.9     Platelet Count, POC 214  142 - 424 K/uL   MPV 7.1  0 - 99.8 fL  POCT UA - MICROSCOPIC ONLY      Result Value Ref Range   WBC, Ur, HPF, POC 0-1     RBC, urine, microscopic 0-2     Bacteria, U Microscopic neg     Mucus, UA neg     Epithelial cells, urine per micros 0-3     Crystals, Ur, HPF, POC neg     Casts, Ur, LPF, POC neg     Yeast, UA neg    POCT URINALYSIS DIPSTICK      Result Value Ref Range   Color, UA yellow     Clarity, UA clear     Glucose, UA neg     Bilirubin, UA neg     Ketones, UA neg     Spec Grav, UA 1.010     Blood, UA small     pH, UA 7.0     Protein, UA neg     Urobilinogen, UA 0.2     Nitrite, UA neg     Leukocytes, UA Trace       EKG/XRAY:   Primary read interpreted by Dr. Marin Comment at [facility].   ASSESSMENT/PLAN: Encounter Diagnoses  Name Primary?  . Dizziness and giddiness   . Other malaise and fatigue   . Acute maxillary sinusitis, recurrence not specified Yes  . Hematuria   . Abnormal urine    Pleasant, active  79 y/o female who has allergies and  Works in dusty situations, she has acute sinusitis which may be contorbuting to her  Dizziness vs + orthostatics vs UTI 132/81, 77; 130/83, 58; 113/77, 54 Advsie to drink water and also get up and down slowly She does have some hematuria which she states she never has had before  She has trace leuks so will cx but I think this is primarily sinus related F/u with PCP, recheck urine in sept. When she is supposed to get a new PCP Rx, otc nasacort,  amoxacillin   Gross sideeffects, risk and benefits, and alternatives of medications d/w patient. Patient is aware that all medications have potential sideeffects and we are unable to predict every sideeffect or drug-drug interaction that may occur.  , [place], DO 06/11/2014 10:15 AM

## 2014-06-11 NOTE — Patient Instructions (Signed)

## 2014-06-12 LAB — URINE CULTURE
Colony Count: NO GROWTH
Organism ID, Bacteria: NO GROWTH

## 2014-06-15 ENCOUNTER — Encounter: Payer: Self-pay | Admitting: *Deleted

## 2014-08-07 ENCOUNTER — Encounter: Payer: Self-pay | Admitting: Internal Medicine

## 2014-08-07 ENCOUNTER — Ambulatory Visit (INDEPENDENT_AMBULATORY_CARE_PROVIDER_SITE_OTHER): Payer: Medicare Other | Admitting: Internal Medicine

## 2014-08-07 VITALS — BP 122/82 | HR 74 | Temp 98.1°F | Resp 16 | Ht 64.0 in | Wt 174.1 lb

## 2014-08-07 DIAGNOSIS — K219 Gastro-esophageal reflux disease without esophagitis: Secondary | ICD-10-CM | POA: Diagnosis not present

## 2014-08-07 DIAGNOSIS — N318 Other neuromuscular dysfunction of bladder: Secondary | ICD-10-CM

## 2014-08-07 DIAGNOSIS — H9190 Unspecified hearing loss, unspecified ear: Secondary | ICD-10-CM | POA: Diagnosis not present

## 2014-08-07 DIAGNOSIS — M545 Low back pain, unspecified: Secondary | ICD-10-CM

## 2014-08-07 DIAGNOSIS — N3281 Overactive bladder: Secondary | ICD-10-CM

## 2014-08-07 DIAGNOSIS — Z23 Encounter for immunization: Secondary | ICD-10-CM

## 2014-08-07 DIAGNOSIS — Z0000 Encounter for general adult medical examination without abnormal findings: Secondary | ICD-10-CM

## 2014-08-07 MED ORDER — SOLIFENACIN SUCCINATE 5 MG PO TABS: 5.0000 mg | ORAL_TABLET | Freq: Every day | ORAL | Status: DC

## 2014-08-07 MED ORDER — CELECOXIB 200 MG PO CAPS: 200.0000 mg | ORAL_CAPSULE | Freq: Every day | ORAL | Status: DC

## 2014-08-07 MED ORDER — MELATONIN 3 MG PO TABS: 3.0000 mg | ORAL_TABLET | Freq: Every evening | ORAL | Status: DC | PRN

## 2014-08-07 MED ORDER — PANTOPRAZOLE SODIUM 40 MG PO TBEC: 40.0000 mg | DELAYED_RELEASE_TABLET | Freq: Every day | ORAL | Status: DC

## 2014-08-07 MED ORDER — FLUTICASONE PROPIONATE 50 MCG/ACT NA SUSP: 2.0000 | Freq: Every day | NASAL | Status: DC

## 2014-08-07 NOTE — Assessment & Plan Note (Signed)
Cochlear implant

## 2014-08-07 NOTE — Patient Instructions (Signed)
We have given you the flu shot today.   We have sent in a nose spray to use for your allergies called flonase. Use 2 sprays in each nostril one time per day. Another thing to think about is to get a humidifier for your bedroom.   We have refilled your medicines and sent in vesicare for you.  We have sent in a medicine called melatonin to help with your sleep. Take 1 pill about 30-45 minutes before you want to go to bed. This will help to restore your body's normal sleep rhythm.

## 2014-08-07 NOTE — Assessment & Plan Note (Signed)
Refill celebrex at today's visit.

## 2014-08-07 NOTE — Progress Notes (Signed)
Pre visit review using our clinic review tool, if applicable. No additional management support is needed unless otherwise documented below in the visit note. 

## 2014-08-07 NOTE — Assessment & Plan Note (Signed)
Prescription of vesicare sent in for her to trial.

## 2014-08-07 NOTE — Assessment & Plan Note (Signed)
Refill of protonix provided at today's visit.

## 2014-08-07 NOTE — Assessment & Plan Note (Signed)
Flu shot given today, she will get mammogram later this month.

## 2014-08-07 NOTE — Progress Notes (Signed)
   Subjective:    Patient ID: Emily Bentley, female    DOB: 07-07-1935, 79 y.o.   MRN: [id number]  HPI The patient is a 79 YO woman who is coming in today to establish care. She has PMH of GERD, overactive bladder, some low back pain, hearing impairment (recent cochlear implant has helped a lot). She is having some congestion in her nose and mouth when awakening and thinks it may be related to her ceiling air vents. She uses zyrtec over the counter. She does work 2 jobs and keeps herself busy. She would like to go back on vesicare as this was helping previously.   Review of Systems  Constitutional: Negative for fever, activity change, appetite change and fatigue.  HENT: Positive for congestion and hearing loss. Negative for ear discharge, ear pain, rhinorrhea, sinus pressure and tinnitus.   Eyes: Negative.   Respiratory: Negative for cough, chest tightness, shortness of breath and wheezing.   Cardiovascular: Negative for chest pain, palpitations and leg swelling.  Gastrointestinal: Negative for abdominal pain, diarrhea, constipation and abdominal distention.  Genitourinary: Positive for frequency. Negative for dysuria.  Musculoskeletal: Positive for back pain. Negative for gait problem and myalgias.  Skin: Negative.   Neurological: Negative.       Objective:   Physical Exam  Vitals reviewed. Constitutional: She is oriented to person, place, and time. She appears well-developed and well-nourished.  HENT:  Head: Normocephalic and atraumatic.  Ears clear, cochlear implant in place  Eyes: EOM are normal.  Neck: Normal range of motion.  Cardiovascular: Normal rate and regular rhythm.   Pulmonary/Chest: Effort normal and breath sounds normal. No respiratory distress. She has no wheezes. She has no rales.  Abdominal: Soft. Bowel sounds are normal. She exhibits no distension. There is no tenderness. There is no rebound.  Neurological: She is alert and oriented to person, place, and time.    Skin: Skin is warm and dry.      Assessment & Plan:

## 2014-12-13 DIAGNOSIS — Z1231 Encounter for screening mammogram for malignant neoplasm of breast: Secondary | ICD-10-CM | POA: Diagnosis not present

## 2015-01-01 ENCOUNTER — Encounter: Payer: Self-pay | Admitting: Internal Medicine

## 2015-02-14 DIAGNOSIS — Z48810 Encounter for surgical aftercare following surgery on the sense organs: Secondary | ICD-10-CM | POA: Diagnosis not present

## 2015-02-14 DIAGNOSIS — Z9621 Cochlear implant status: Secondary | ICD-10-CM | POA: Diagnosis not present

## 2015-02-14 DIAGNOSIS — H903 Sensorineural hearing loss, bilateral: Secondary | ICD-10-CM | POA: Diagnosis not present

## 2015-02-27 ENCOUNTER — Ambulatory Visit (INDEPENDENT_AMBULATORY_CARE_PROVIDER_SITE_OTHER): Payer: Medicare Other

## 2015-02-27 ENCOUNTER — Encounter: Payer: Self-pay | Admitting: Podiatry

## 2015-02-27 ENCOUNTER — Ambulatory Visit (INDEPENDENT_AMBULATORY_CARE_PROVIDER_SITE_OTHER): Payer: Medicare Other | Admitting: Podiatry

## 2015-02-27 VITALS — BP 128/71 | HR 81 | Temp 98.1°F | Resp 14

## 2015-02-27 DIAGNOSIS — M722 Plantar fascial fibromatosis: Secondary | ICD-10-CM

## 2015-02-27 DIAGNOSIS — R52 Pain, unspecified: Secondary | ICD-10-CM

## 2015-02-27 MED ORDER — TRIAMCINOLONE ACETONIDE 10 MG/ML IJ SUSP
10.0000 mg | Freq: Once | INTRAMUSCULAR | Status: AC
Start: 2015-02-27 — End: 2015-02-27
  Administered 2015-02-27: 10 mg

## 2015-02-27 NOTE — Patient Instructions (Signed)
Bent - Knee Calf Stretch  1) Stand an arm's length away from a wall. Place the palms of your hands on the wall. Step forward about 12 inches with the opposite foot.  2) Keeping toes pointed forward and both heels on the floor, bend both knees and lean forward. Hold this position for 60 seconds. Don't arch your back and don't hunch your shoulders.  3) Repeat this twice.  DO THIS STRETCHING TECHNIQUE 3 TIMES A DAY.   Stretching Exercises before Standing      Pull your toes up toward your nose and hold for 1 minute before standing.  A towel can assist with this exercise if you put the towel under the ball of your foot. This exercise reduces the intense    pain associated when changing from a seated to a standing position. This stretch can usually be the most beneficial if done before getting out of bed in the mornings. Plantar Fasciitis Plantar fasciitis is a common condition that causes foot pain. It is soreness (inflammation) of the band of tough fibrous tissue on the bottom of the foot that runs from the heel bone (calcaneus) to the ball of the foot. The cause of this soreness may be from excessive standing, poor fitting shoes, running on hard surfaces, being overweight, having an abnormal walk, or overuse (this is common in runners) of the painful foot or feet. It is also common in aerobic exercise dancers and ballet dancers. SYMPTOMS  Most people with plantar fasciitis complain of:  Severe pain in the morning on the bottom of their foot especially when taking the first steps out of bed. This pain recedes after a few minutes of walking.  Severe pain is experienced also during walking following a long period of inactivity.  Pain is worse when walking barefoot or up stairs DIAGNOSIS   Your caregiver will diagnose this condition by examining and feeling your foot.  Special tests such as X-rays of your foot, are usually not needed. PREVENTION   Consult a sports medicine professional  before beginning a new exercise program.  Walking programs offer a good workout. With walking there is a lower chance of overuse injuries common to runners. There is less impact and less jarring of the joints.  Begin all new exercise programs slowly. If problems or pain develop, decrease the amount of time or distance until you are at a comfortable level.  Wear good shoes and replace them regularly.  Stretch your foot and the heel cords at the back of the ankle (Achilles tendon) both before and after exercise.  Run or exercise on even surfaces that are not hard. For example, asphalt is better than pavement.  Do not run barefoot on hard surfaces.  If using a treadmill, vary the incline.  Do not continue to workout if you have foot or joint problems. Seek professional help if they do not improve. HOME CARE INSTRUCTIONS   Avoid activities that cause you pain until you recover.  Use ice or cold packs on the problem or painful areas after working out.  Only take over-the-counter or prescription medicines for pain, discomfort, or fever as directed by your caregiver.  Soft shoe inserts or athletic shoes with air or gel sole cushions may be helpful.  If problems continue or become more severe, consult a sports medicine caregiver or your own health care provider. Cortisone is a potent anti-inflammatory medication that may be injected into the painful area. You can discuss this treatment with your caregiver. MAKE   SURE YOU:   Understand these instructions.  Will watch your condition.  Will get help right away if you are not doing well or get worse. Document Released: 07/21/2001 Document Revised: 01/18/2012 Document Reviewed: 09/19/2008 Exit[employer] Patient Information 2015 [employer]. This information is not intended to replace advice given to you by your health care provider. Make sure you discuss any questions you have with your health care provider.  

## 2015-02-27 NOTE — Progress Notes (Signed)
   Subjective:    Patient ID: Emily Bentley, female    DOB: September 16, 1935, 80 y.o.   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   Gastrointestinal: Positive for constipation.   History of cochlear implant     Objective:   Physical Exam  Orientated 3 patient presents with her sister who is in the treatment room today  Vascular: DP and PT pulses 2/4 bilaterally Capillary reflex immediate bilaterally  Neurological: Sensation to 10 g monofilament wire intact 5/5 bilaterally Vibratory sensation intact bilaterally Ankle reflex equal and reactive bilaterally  Dermatological: Texture and turgor within normal limits bilaterally No skin lesions noted bilaterally  Musculoskeletal: There is no restriction ankle, subtalar, midtarsal joints bilaterally There is palpable tenderness in the medial plantar, central, lateral fascial area left heel, without any palpable lesions. The fat pad in the heels are adequate bilaterally  X-ray examination weightbearing left foot  Intact bony structure without fracture and/or dislocation Inferior calcaneal spur Hammertoe deformity second Surgical resection head proximal phalanx fifth toe Bone density appears adequate Joint spaces appear adequate  Radiographic impression: No acute bony abnormality noted left foot      Assessment & Plan:   Assessment: Satisfactory neurovascular status Plantar fasciitis left  Plan: I reviewed the results of the examination x-ray today with patient in detail. I offered patient Kenalog injection and she verbally consents.  Skin is prepped  with alcohol and Betadine and 10 mg of Kenalog mixed with 10 mg of plain Xylocaine and 2.5 mg of plain Marcaine were injected inferior heel left for Kenalog injection #1. Patient tolerated procedure without any difficulty.  Shoeing and stretching discussed  Reappoint at patient's request if symptoms are not improving the next 6-8 weeks

## 2015-03-11 ENCOUNTER — Ambulatory Visit (INDEPENDENT_AMBULATORY_CARE_PROVIDER_SITE_OTHER): Payer: Medicare Other | Admitting: Emergency Medicine

## 2015-03-11 ENCOUNTER — Ambulatory Visit (INDEPENDENT_AMBULATORY_CARE_PROVIDER_SITE_OTHER): Payer: Medicare Other

## 2015-03-11 VITALS — BP 126/70 | HR 85 | Temp 99.1°F | Resp 16 | Ht 63.0 in | Wt 171.0 lb

## 2015-03-11 DIAGNOSIS — J189 Pneumonia, unspecified organism: Secondary | ICD-10-CM

## 2015-03-11 DIAGNOSIS — Z91048 Other nonmedicinal substance allergy status: Secondary | ICD-10-CM

## 2015-03-11 DIAGNOSIS — R05 Cough: Secondary | ICD-10-CM | POA: Diagnosis not present

## 2015-03-11 DIAGNOSIS — Z9109 Other allergy status, other than to drugs and biological substances: Secondary | ICD-10-CM

## 2015-03-11 DIAGNOSIS — R059 Cough, unspecified: Secondary | ICD-10-CM

## 2015-03-11 LAB — POCT CBC
Granulocyte percent: 68.1 %G (ref 37–80)
HEMATOCRIT: 42.3 % (ref 37.7–47.9)
HEMOGLOBIN: 13.3 g/dL (ref 12.2–16.2)
LYMPH, POC: 1.6 (ref 0.6–3.4)
MCH, POC: 25.9 pg — AB (ref 27–31.2)
MCHC: 31.5 g/dL — AB (ref 31.8–35.4)
MCV: 82.2 fL (ref 80–97)
MID (CBC): 0.5 (ref 0–0.9)
MPV: 6.6 fL (ref 0–99.8)
POC GRANULOCYTE: 4.4 (ref 2–6.9)
POC LYMPH PERCENT: 24.1 %L (ref 10–50)
POC MID %: 7.8 % (ref 0–12)
Platelet Count, POC: 215 10*3/uL (ref 142–424)
RBC: 5.15 M/uL (ref 4.04–5.48)
RDW, POC: 15 %
WBC: 6.5 10*3/uL (ref 4.6–10.2)

## 2015-03-11 LAB — POCT INFLUENZA A/B
INFLUENZA A, POC: NEGATIVE
INFLUENZA B, POC: NEGATIVE

## 2015-03-11 MED ORDER — AZITHROMYCIN 250 MG PO TABS: ORAL_TABLET | ORAL | Status: DC

## 2015-03-11 MED ORDER — FLUTICASONE PROPIONATE 50 MCG/ACT NA SUSP: 2.0000 | Freq: Every day | NASAL | Status: DC

## 2015-03-11 MED ORDER — BENZONATATE 100 MG PO CAPS: 100.0000 mg | ORAL_CAPSULE | Freq: Three times a day (TID) | ORAL | Status: DC | PRN

## 2015-03-11 NOTE — Patient Instructions (Signed)

## 2015-03-11 NOTE — Progress Notes (Addendum)
Subjective:  This chart was scribed for Emily Russian, MD by Emily Bentley, ED Scribe. The patient was seen in room 10. Patient's care was started at 8:10 AM.   Patient ID: Emily Bentley, female    DOB: 20-Nov-1934, 80 y.o.   MRN: [id number]  Chief Complaint  Patient presents with  . Illness    cough, congestion x 2 days   HPI HPI Comments: Emily Bentley is a 80 y.o. female, with a h/o Gastric Antral Vascular Ectasiaasthma, asthma, IBS, diverticulosis, who presents to the [facility] complaining of mildly productive cough with clear phlegm for the past 2 days. Pt reports that cough started as sore throat last week for 2 days that resolved with cough drops and Chloraseptic spray. Pt reports associated sinus pressure, HA, rhinorrhea, congestion, eye watering. She denies fever, body aches.   L Ear  Pt has a L cochlear implant placed by Dr. Jonna Bentley at [facility].   Pt's daughter passed at age 9 from Lupus on the same day as her other daughter's wedding.  Pt currently works 2 jobs that she enjoys.   Past Medical History  Diagnosis Date  . Watermelon stomach   . Peptic stricture of esophagus   . GERD (gastroesophageal reflux disease)   . Chronic allergic rhinitis     with asthmatic component  . Hormone replacement therapy (postmenopausal)   . History of PSVT (paroxysmal supraventricular tachycardia)   . History of pneumonia   . History of colon polyps   . Hearing impairment   . History of bursitis     right shoulder  . Hiatal hernia   . Diverticulosis of colon (without mention of hemorrhage)   . IBS (irritable bowel syndrome)   . Allergy   . Arthritis   . Asthma   . Cataract    Current Outpatient Prescriptions on File Prior to Visit  Medication Sig Dispense Refill  . celecoxib (CELEBREX) 200 MG capsule Take 1 capsule (200 mg total) by mouth daily. 30 capsule 5  . cetirizine (ZYRTEC) 5 MG tablet Take 5 mg by mouth daily.    Marland Kitchen esomeprazole (NEXIUM) 20 MG  capsule Take 20 mg by mouth daily at 12 noon.    . fluticasone (FLONASE) 50 MCG/ACT nasal spray Place 2 sprays into both nostrils daily. 16 g 6  . Magnesium Hydroxide (MILK OF MAGNESIA PO) Take by mouth as directed.      . Melatonin 3 MG TABS Take 1 tablet (3 mg total) by mouth at bedtime as needed (sleep). 30 tablet 6  . Multiple Vitamins-Minerals (MULTIVITAL PLATINUM PO) Take 1 tablet by mouth daily.      . Multiple Vitamins-Minerals (OCUVITE PO) Take 1 tablet by mouth daily.      . solifenacin (VESICARE) 5 MG tablet Take 1 tablet (5 mg total) by mouth daily. 30 tablet 3   No current facility-administered medications on file prior to visit.   Allergies  Allergen Reactions  . Sulfamethoxazole-Trimethoprim    Review of Systems  Constitutional: Negative for fever.  HENT: Positive for congestion, rhinorrhea and sinus pressure.   Respiratory: Positive for cough.   Musculoskeletal: Negative for myalgias.  Allergic/Immunologic: Positive for environmental allergies.  Neurological: Positive for headaches.   BP 126/70 mmHg  Pulse 85  Temp(Src) 99.1 F (37.3 C)  Resp 16  Ht 5\' 3"  (1.6 m)  Wt 171 lb (77.565 kg)  BMI 30.30 kg/m2  SpO2 97%    Objective:   Physical Exam  CONSTITUTIONAL: Well developed/well nourished HEAD: Normocephalic/atraumatic EYES: EOMI/PERRL ENMT: Mucous membranes moist, nasal congestion and irritation around both nares, L cochlear implant NECK: supple no meningeal signs SPINE/BACK:entire spine nontender CV: S1/S2 noted, no murmurs/rubs/gallops noted LUNGS: Lungs are clear to auscultation bilaterally, no apparent distress ABDOMEN: soft, nontender, no rebound or guarding, bowel sounds noted throughout abdomen GU:no cva tenderness NEURO: Pt is awake/alert/appropriate, moves all extremitiesx4.  No facial droop.   EXTREMITIES: pulses normal/equal, full ROM SKIN: warm, color normal PSYCH: no abnormalities of mood noted, alert and oriented to situation   [facility] reading  (PRIMARY) by  Dr.Daub there is a patchy left lower lobe infiltrate Results for orders placed or performed in visit on 03/11/15  POCT Influenza A/B  Result Value Ref Range   Influenza A, POC Negative    Influenza B, POC Negative    Results for orders placed or performed in visit on 03/11/15  POCT Influenza A/B  Result Value Ref Range   Influenza A, POC Negative    Influenza B, POC Negative   POCT CBC  Result Value Ref Range   WBC 6.5 4.6 - 10.2 K/uL   Lymph, poc 1.6 0.6 - 3.4   POC LYMPH PERCENT 24.1 10 - 50 %L   MID (cbc) 0.5 0 - 0.9   POC MID % 7.8 0 - 12 %M   POC Granulocyte 4.4 2 - 6.9   Granulocyte percent 68.1 37 - 80 %G   RBC 5.15 4.04 - 5.48 M/uL   Hemoglobin 13.3 12.2 - 16.2 g/dL   HCT, POC 42.3 37.7 - 47.9 %   MCV 82.2 80 - 97 fL   MCH, POC 25.9 (A) 27 - 31.2 pg   MCHC 31.5 (A) 31.8 - 35.4 g/dL   RDW, POC 15.0 %   Platelet Count, POC 215 142 - 424 K/uL   MPV 6.6 0 - 99.8 fL   Meds ordered this encounter  Medications  . azithromycin (ZITHROMAX) 250 MG tablet    Sig: Take 2 tabs PO x 1 dose, then 1 tab PO QD x 4 days    Dispense:  6 tablet    Refill:  0  . benzonatate (TESSALON) 100 MG capsule    Sig: Take 1-2 capsules (100-200 mg total) by mouth 3 (three) times daily as needed for cough.    Dispense:  40 capsule    Refill:  0  . fluticasone (FLONASE) 50 MCG/ACT nasal spray    Sig: Place 2 sprays into both nostrils daily.    Dispense:  16 g    Refill:  6   Assessment & Plan:    Patient has what appears to be a touch of pneumonia. Will treat with Tessalon Perles and Z-Pak. Recheck in 48-72 hours if not improving. She will also be given Flonase for her nasal congestion.I personally performed the services described in this documentation, which was scribed in my presence. The recorded information has been reviewed and is accurate.

## 2015-03-18 ENCOUNTER — Encounter: Payer: Self-pay | Admitting: Podiatry

## 2015-03-18 ENCOUNTER — Ambulatory Visit (INDEPENDENT_AMBULATORY_CARE_PROVIDER_SITE_OTHER): Payer: Medicare Other | Admitting: Podiatry

## 2015-03-18 VITALS — BP 135/82 | HR 64 | Resp 12

## 2015-03-18 DIAGNOSIS — M722 Plantar fascial fibromatosis: Secondary | ICD-10-CM | POA: Diagnosis not present

## 2015-03-18 NOTE — Patient Instructions (Signed)
  If possible lead bandage on left foot 5 days Do not stretch when bandages on left foot Return as needed if heel pain persists  Plantar Fasciitis Plantar fasciitis is a common condition that causes foot pain. It is soreness (inflammation) of the band of tough fibrous tissue on the bottom of the foot that runs from the heel bone (calcaneus) to the ball of the foot. The cause of this soreness may be from excessive standing, poor fitting shoes, running on hard surfaces, being overweight, having an abnormal walk, or overuse (this is common in runners) of the painful foot or feet. It is also common in aerobic exercise dancers and ballet dancers. SYMPTOMS  Most people with plantar fasciitis complain of:  Severe pain in the morning on the bottom of their foot especially when taking the first steps out of bed. This pain recedes after a few minutes of walking.  Severe pain is experienced also during walking following a long period of inactivity.  Pain is worse when walking barefoot or up stairs DIAGNOSIS   Your caregiver will diagnose this condition by examining and feeling your foot.  Special tests such as X-rays of your foot, are usually not needed. PREVENTION   Consult a sports medicine professional before beginning a new exercise program.  Walking programs offer a good workout. With walking there is a lower chance of overuse injuries common to runners. There is less impact and less jarring of the joints.  Begin all new exercise programs slowly. If problems or pain develop, decrease the amount of time or distance until you are at a comfortable level.  Wear good shoes and replace them regularly.  Stretch your foot and the heel cords at the back of the ankle (Achilles tendon) both before and after exercise.  Run or exercise on even surfaces that are not hard. For example, asphalt is better than pavement.  Do not run barefoot on hard surfaces.  If using a treadmill, vary the  incline.  Do not continue to workout if you have foot or joint problems. Seek professional help if they do not improve. HOME CARE INSTRUCTIONS   Avoid activities that cause you pain until you recover.  Use ice or cold packs on the problem or painful areas after working out.  Only take over-the-counter or prescription medicines for pain, discomfort, or fever as directed by your caregiver.  Soft shoe inserts or athletic shoes with air or gel sole cushions may be helpful.  If problems continue or become more severe, consult a sports medicine caregiver or your own health care provider. Cortisone is a potent anti-inflammatory medication that may be injected into the painful area. You can discuss this treatment with your caregiver. MAKE SURE YOU:   Understand these instructions.  Will watch your condition.  Will get help right away if you are not doing well or get worse. Document Released: 07/21/2001 Document Revised: 01/18/2012 Document Reviewed: 09/19/2008 [facility] Patient Information 2015 [place], [place]. This information is not intended to replace advice given to you by your health care provider. Make sure you discuss any questions you have with your health care provider.

## 2015-03-19 DIAGNOSIS — M722 Plantar fascial fibromatosis: Secondary | ICD-10-CM | POA: Diagnosis not present

## 2015-03-19 MED ORDER — TRIAMCINOLONE ACETONIDE 10 MG/ML IJ SUSP
10.0000 mg | Freq: Once | INTRAMUSCULAR | Status: AC
  Administered 2015-03-19: 10 mg

## 2015-03-19 NOTE — Progress Notes (Signed)
Patient ID: Emily Bentley, female   DOB: September 24, 1935, 80 y.o.   MRN: [id number]  Subjective: This patient presents again complaining of a painful left heel activated with standing walking. She has returned to her full-time.job after recovering from a pneumonia. She's had one Kenalog Injection inferior left heel on the visit of 02/27/2015 which provider a great deal of relief.  Objective: Orientated 3 No edema noted bilaterally Palpable tenderness medial plantar fascial insertional area left duplicating area of discomfort without any palpable lesions  Assessment: Plantar fasciitis left  Plan: Offered patient another Kenalog injection she verbally consents. Skin is prepped with alcohol and Betadine and 10 mg of Kenalog mixed with 10 mg of plain Xylocaine and 2.5 mg of plain Marcaine were injected into the inferior heel left for Kenalog injection #2  Patient tolerated procedure without any difficulty Fascial taping applied left with instructions to leave on if possible up to 5 days  Continue to wear athletic style shoes over-the-counter inserts Return if symptoms persist and would consider custom foot orthotic if she returns for the same problem

## 2015-03-25 ENCOUNTER — Other Ambulatory Visit (INDEPENDENT_AMBULATORY_CARE_PROVIDER_SITE_OTHER): Payer: Medicare Other

## 2015-03-25 ENCOUNTER — Ambulatory Visit (INDEPENDENT_AMBULATORY_CARE_PROVIDER_SITE_OTHER): Payer: Medicare Other | Admitting: Internal Medicine

## 2015-03-25 ENCOUNTER — Encounter: Payer: Self-pay | Admitting: Internal Medicine

## 2015-03-25 VITALS — BP 138/82 | HR 60 | Temp 98.0°F | Resp 16 | Ht 63.0 in | Wt 172.8 lb

## 2015-03-25 DIAGNOSIS — R7301 Impaired fasting glucose: Secondary | ICD-10-CM

## 2015-03-25 DIAGNOSIS — E785 Hyperlipidemia, unspecified: Secondary | ICD-10-CM

## 2015-03-25 DIAGNOSIS — R309 Painful micturition, unspecified: Secondary | ICD-10-CM

## 2015-03-25 DIAGNOSIS — D531 Other megaloblastic anemias, not elsewhere classified: Secondary | ICD-10-CM | POA: Diagnosis not present

## 2015-03-25 DIAGNOSIS — N3281 Overactive bladder: Secondary | ICD-10-CM

## 2015-03-25 DIAGNOSIS — R200 Anesthesia of skin: Secondary | ICD-10-CM | POA: Insufficient documentation

## 2015-03-25 DIAGNOSIS — R208 Other disturbances of skin sensation: Secondary | ICD-10-CM | POA: Diagnosis not present

## 2015-03-25 DIAGNOSIS — Z0000 Encounter for general adult medical examination without abnormal findings: Secondary | ICD-10-CM

## 2015-03-25 LAB — COMPREHENSIVE METABOLIC PANEL
ALT: 11 U/L (ref 0–35)
AST: 22 U/L (ref 0–37)
Albumin: 3.8 g/dL (ref 3.5–5.2)
Alkaline Phosphatase: 72 U/L (ref 39–117)
BUN: 15 mg/dL (ref 6–23)
CHLORIDE: 105 meq/L (ref 96–112)
CO2: 29 mEq/L (ref 19–32)
CREATININE: 1 mg/dL (ref 0.40–1.20)
Calcium: 9.9 mg/dL (ref 8.4–10.5)
GFR: 69.07 mL/min (ref >60.00)
Glucose, Bld: 92 mg/dL (ref 70–99)
POTASSIUM: 4.7 meq/L (ref 3.5–5.1)
Sodium: 139 mEq/L (ref 135–145)
Total Bilirubin: 0.9 mg/dL (ref 0.2–1.2)
Total Protein: 7.5 g/dL (ref 6.0–8.3)

## 2015-03-25 LAB — POCT URINALYSIS DIPSTICK
BILIRUBIN UA: NEGATIVE
Blood, UA: POSITIVE
GLUCOSE UA: NEGATIVE
Ketones, UA: NEGATIVE
Leukocytes, UA: NEGATIVE
NITRITE UA: NEGATIVE
Protein, UA: NEGATIVE
Spec Grav, UA: 1.02
UROBILINOGEN UA: 4
pH, UA: 6

## 2015-03-25 LAB — LIPID PANEL
CHOLESTEROL: 199 mg/dL (ref 0–200)
HDL: 63 mg/dL (ref >39.00)
LDL Cholesterol: 122 mg/dL — ABNORMAL HIGH (ref 0–99)
NonHDL: 136
TRIGLYCERIDES: 71 mg/dL (ref 0.0–149.0)
Total CHOL/HDL Ratio: 3
VLDL: 14.2 mg/dL (ref 0.0–40.0)

## 2015-03-25 LAB — VITAMIN B12: Vitamin B-12: 614 pg/mL (ref 211–911)

## 2015-03-25 LAB — FOLATE: Folate: 12.8 ng/mL (ref >5.9)

## 2015-03-25 LAB — HEMOGLOBIN A1C: HEMOGLOBIN A1C: 5.5 % (ref 4.6–6.5)

## 2015-03-25 MED ORDER — BENZONATATE 100 MG PO CAPS: 100.0000 mg | ORAL_CAPSULE | Freq: Three times a day (TID) | ORAL | Status: DC | PRN

## 2015-03-25 NOTE — Progress Notes (Signed)
   Subjective:    Patient ID: Emily Bentley, female    DOB: [birth year], 80 y.o.   MRN: [id number]  HPI The patient is a 80 YO female who is coming in today for some numbness in feet as well as discomfort with urination. She denies frequency or urgency. No abdominal pain or fevers or chills. Trying to drink extra water. The numbness in her feet she is worried about since diabetes runs in her family. She has been having foot problems but those are finally getting better. Denies numbness now. No sores or cuts on the feet.   Review of Systems  Constitutional: Negative for fever, activity change, appetite change and fatigue.  Respiratory: Negative for cough, chest tightness, shortness of breath and wheezing.   Cardiovascular: Negative for chest pain, palpitations and leg swelling.  Gastrointestinal: Negative for abdominal pain, diarrhea, constipation and abdominal distention.  Genitourinary: Positive for dysuria. Negative for urgency and frequency.  Musculoskeletal: Negative for myalgias and gait problem.  Neurological: Positive for numbness. Negative for dizziness and weakness.      Objective:   Physical Exam  Constitutional: She is oriented to person, place, and time. She appears well-developed and well-nourished.  HENT:  Head: Normocephalic and atraumatic.  Eyes: EOM are normal.  Neck: Normal range of motion.  Cardiovascular: Normal rate and regular rhythm.   Pulmonary/Chest: Effort normal and breath sounds normal. No respiratory distress. She has no wheezes. She has no rales.  Abdominal: Soft. She exhibits no distension. There is no tenderness.  Neurological: She is alert and oriented to person, place, and time. Coordination normal.  Skin: Skin is warm and dry.  Vitals reviewed.  Filed Vitals:   03/25/15 1004  BP: 138/82  Pulse: 60  Temp: 98 F (36.7 C)  TempSrc: Oral  Resp: 16  Height: 5\' 3"  (1.6 m)  Weight: 172 lb 12.8 oz (78.382 kg)  SpO2: 97%      Assessment & Plan:

## 2015-03-25 NOTE — Assessment & Plan Note (Signed)
Checking B12 and folate as well as CMP and HgA1c to check for any causes of the pains and numbness in her feet. They are mild and intermittent and do not require medicine at this time.

## 2015-03-25 NOTE — Patient Instructions (Signed)
You do not have signs of infection in the urine but we will check you for signs of diabetes and for vitamin deficiency that could be causing the problems.   We will call you back with the results and have you schedule a physical so we can discuss the results.   We have refilled the cough medicine.

## 2015-03-25 NOTE — Progress Notes (Signed)
Pre visit review using our clinic review tool, if applicable. No additional management support is needed unless otherwise documented below in the visit note. 

## 2015-03-25 NOTE — Assessment & Plan Note (Signed)
Checked U/A today for discomfort and negative for signs of infection but positive for small blood. Will recheck at next visit and if persistently positive will refer to urology.

## 2015-04-25 ENCOUNTER — Encounter: Payer: Self-pay | Admitting: Internal Medicine

## 2015-04-25 ENCOUNTER — Ambulatory Visit (INDEPENDENT_AMBULATORY_CARE_PROVIDER_SITE_OTHER): Payer: Medicare Other | Admitting: Internal Medicine

## 2015-04-25 VITALS — BP 130/80 | HR 59 | Temp 97.8°F | Resp 14 | Ht 63.0 in | Wt 147.8 lb

## 2015-04-25 DIAGNOSIS — Z0000 Encounter for general adult medical examination without abnormal findings: Secondary | ICD-10-CM | POA: Diagnosis not present

## 2015-04-25 DIAGNOSIS — Z23 Encounter for immunization: Secondary | ICD-10-CM | POA: Diagnosis not present

## 2015-04-25 DIAGNOSIS — N3281 Overactive bladder: Secondary | ICD-10-CM

## 2015-04-25 DIAGNOSIS — Z9621 Cochlear implant status: Secondary | ICD-10-CM

## 2015-04-25 MED ORDER — TRAMADOL HCL 50 MG PO TABS: 25.0000 mg | ORAL_TABLET | Freq: Every day | ORAL | Status: DC | PRN

## 2015-04-25 MED ORDER — ALPRAZOLAM 0.5 MG PO TABS: 0.5000 mg | ORAL_TABLET | Freq: Every evening | ORAL | Status: DC | PRN

## 2015-04-25 NOTE — Assessment & Plan Note (Signed)
Doing well and hearing okay.

## 2015-04-25 NOTE — Progress Notes (Signed)
   Subjective:    Patient ID: Emily Bentley, female    DOB: 1935-06-30, 80 y.o.   MRN: [id number]  HPI Here for medicare wellness, no new complaints. Please see A/P for status and treatment of chronic medical problems.   Diet: heart healthy Physical activity: active Depression/mood screen: negative Hearing: decreased Visual acuity: grossly normal, performs annual eye exam  ADLs: capable, still works Fall risk: none Home safety: good Cognitive evaluation: intact to orientation, naming, recall and repetition EOL planning: adv directives discussed and will talk with daughter about wishese  I have personally reviewed and have noted 1. The patient's medical and social history - reviewed today no changes 2. Their use of alcohol, tobacco or illicit drugs 3. Their current medications and supplements 4. The patient's functional ability including ADL's, fall risks, home safety risks and hearing or visual impairment. 5. Diet and physical activities 6. Evidence for depression or mood disorders 7. Care team reviewed and updated (available in snapshot)  Review of Systems  Constitutional: Negative for fever, activity change, appetite change and fatigue.  HENT: Positive for hearing loss. Negative for ear discharge, ear pain, rhinorrhea, sinus pressure and tinnitus.   Respiratory: Negative for cough, chest tightness, shortness of breath and wheezing.   Cardiovascular: Negative for chest pain, palpitations and leg swelling.  Gastrointestinal: Negative for abdominal pain, diarrhea, constipation and abdominal distention.  Musculoskeletal: Positive for back pain. Negative for myalgias and gait problem.  Skin: Negative.   Neurological: Negative.       Objective:   Physical Exam  Constitutional: She is oriented to person, place, and time. She appears well-developed and well-nourished.  HENT:  Head: Normocephalic and atraumatic.  Eyes: EOM are normal.  Neck: Normal range of motion.    Cardiovascular: Normal rate and regular rhythm.   Pulmonary/Chest: Effort normal and breath sounds normal. No respiratory distress. She has no wheezes. She has no rales.  Abdominal: Soft. She exhibits no distension. There is no tenderness.  Musculoskeletal: She exhibits no edema.  Neurological: She is alert and oriented to person, place, and time. Coordination normal.  Skin: Skin is warm and dry.   Filed Vitals:   04/25/15 0923 04/25/15 1011  BP: 160/92 130/80  Pulse: 59   Temp: 97.8 F (36.6 C)   TempSrc: Oral   Resp: 14   Height: 5\' 3"  (1.6 m)   Weight: 147 lb 12.8 oz (67.042 kg)   SpO2: 98%       Assessment & Plan:  Prevnar given at visit.

## 2015-04-25 NOTE — Progress Notes (Signed)
Pre visit review using our clinic review tool, if applicable. No additional management support is needed unless otherwise documented below in the visit note. 

## 2015-04-25 NOTE — Patient Instructions (Signed)
You are doing great today. We have given you the pneumonia booster shot today.   Come back in about 6-12 months. If you have any problems or questions please feel free to call the office.   Health Maintenance Adopting a healthy lifestyle and getting preventive care can go a long way to promote health and wellness. Talk with your health care provider about what schedule of regular examinations is right for you. This is a good chance for you to check in with your provider about disease prevention and staying healthy. In between checkups, there are plenty of things you can do on your own. Experts have done a lot of research about which lifestyle changes and preventive measures are most likely to keep you healthy. Ask your health care provider for more information. WEIGHT AND DIET  Eat a healthy diet  Be sure to include plenty of vegetables, fruits, low-fat dairy products, and lean protein.  Do not eat a lot of foods high in solid fats, added sugars, or salt.  Get regular exercise. This is one of the most important things you can do for your health.  Most adults should exercise for at least 150 minutes each week. The exercise should increase your heart rate and make you sweat (moderate-intensity exercise).  Most adults should also do strengthening exercises at least twice a week. This is in addition to the moderate-intensity exercise.  Maintain a healthy weight  Body mass index (BMI) is a measurement that can be used to identify possible weight problems. It estimates body fat based on height and weight. Your health care provider can help determine your BMI and help you achieve or maintain a healthy weight.  For females 60 years of age and older:   A BMI below 18.5 is considered underweight.  A BMI of 18.5 to 24.9 is normal.  A BMI of 25 to 29.9 is considered overweight.  A BMI of 30 and above is considered obese.  Watch levels of cholesterol and blood lipids  You should start having  your blood tested for lipids and cholesterol at 78 years of age, then have this test every 5 years.  You may need to have your cholesterol levels checked more often if:  Your lipid or cholesterol levels are high.  You are older than 78 years of age.  You are at high risk for heart disease.  CANCER SCREENING   Lung Cancer  Lung cancer screening is recommended for adults 40-27 years old who are at high risk for lung cancer because of a history of smoking.  A yearly low-dose CT scan of the lungs is recommended for people who:  Currently smoke.  Have quit within the past 15 years.  Have at least a 30-pack-year history of smoking. A pack year is smoking an average of one pack of cigarettes a day for 1 year.  Yearly screening should continue until it has been 15 years since you quit.  Yearly screening should stop if you develop a health problem that would prevent you from having lung cancer treatment.  Breast Cancer  Practice breast self-awareness. This means understanding how your breasts normally appear and feel.  It also means doing regular breast self-exams. Let your health care provider know about any changes, no matter how small.  If you are in your 20s or 30s, you should have a clinical breast exam (CBE) by a health care provider every 1-3 years as part of a regular health exam.  If you are 40 or  older, have a CBE every year. Also consider having a breast X-ray (mammogram) every year.  If you have a family history of breast cancer, talk to your health care provider about genetic screening.  If you are at high risk for breast cancer, talk to your health care provider about having an MRI and a mammogram every year.  Breast cancer gene (BRCA) assessment is recommended for women who have family members with BRCA-related cancers. BRCA-related cancers include:  Breast.  Ovarian.  Tubal.  Peritoneal cancers.  Results of the assessment will determine the need for genetic  counseling and BRCA1 and BRCA2 testing. Cervical Cancer Routine pelvic examinations to screen for cervical cancer are no longer recommended for nonpregnant women who are considered low risk for cancer of the pelvic organs (ovaries, uterus, and vagina) and who do not have symptoms. A pelvic examination may be necessary if you have symptoms including those associated with pelvic infections. Ask your health care provider if a screening pelvic exam is right for you.   The Pap test is the screening test for cervical cancer for women who are considered at risk.  If you had a hysterectomy for a problem that was not cancer or a condition that could lead to cancer, then you no longer need Pap tests.  If you are older than 65 years, and you have had normal Pap tests for the past 10 years, you no longer need to have Pap tests.  If you have had past treatment for cervical cancer or a condition that could lead to cancer, you need Pap tests and screening for cancer for at least 20 years after your treatment.  If you no longer get a Pap test, assess your risk factors if they change (such as having a new sexual partner). This can affect whether you should start being screened again.  Some women have medical problems that increase their chance of getting cervical cancer. If this is the case for you, your health care provider may recommend more frequent screening and Pap tests.  The human papillomavirus (HPV) test is another test that may be used for cervical cancer screening. The HPV test looks for the virus that can cause cell changes in the cervix. The cells collected during the Pap test can be tested for HPV.  The HPV test can be used to screen women 55 years of age and older. Getting tested for HPV can extend the interval between normal Pap tests from three to five years.  An HPV test also should be used to screen women of any age who have unclear Pap test results.  After 78 years of age, women should have  HPV testing as often as Pap tests.  Colorectal Cancer  This type of cancer can be detected and often prevented.  Routine colorectal cancer screening usually begins at 78 years of age and continues through 78 years of age.  Your health care provider may recommend screening at an earlier age if you have risk factors for colon cancer.  Your health care provider may also recommend using home test kits to check for hidden blood in the stool.  A small camera at the end of a tube can be used to examine your colon directly (sigmoidoscopy or colonoscopy). This is done to check for the earliest forms of colorectal cancer.  Routine screening usually begins at age 31.  Direct examination of the colon should be repeated every 5-10 years through 78 years of age. However, you may need to be  screened more often if early forms of precancerous polyps or small growths are found. Skin Cancer  Check your skin from head to toe regularly.  Tell your health care provider about any new moles or changes in moles, especially if there is a change in a mole's shape or color.  Also tell your health care provider if you have a mole that is larger than the size of a pencil eraser.  Always use sunscreen. Apply sunscreen liberally and repeatedly throughout the day.  Protect yourself by wearing long sleeves, pants, a wide-brimmed hat, and sunglasses whenever you are outside. HEART DISEASE, DIABETES, AND HIGH BLOOD PRESSURE   Have your blood pressure checked at least every 1-2 years. High blood pressure causes heart disease and increases the risk of stroke.  If you are between 62 years and 49 years old, ask your health care provider if you should take aspirin to prevent strokes.  Have regular diabetes screenings. This involves taking a blood sample to check your fasting blood sugar level.  If you are at a normal weight and have a low risk for diabetes, have this test once every three years after 77 years of  age.  If you are overweight and have a high risk for diabetes, consider being tested at a younger age or more often. PREVENTING INFECTION  Hepatitis B  If you have a higher risk for hepatitis B, you should be screened for this virus. You are considered at high risk for hepatitis B if:  You were born in a country where hepatitis B is common. Ask your health care provider which countries are considered high risk.  Your parents were born in a high-risk country, and you have not been immunized against hepatitis B (hepatitis B vaccine).  You have HIV or AIDS.  You use needles to inject street drugs.  You live with someone who has hepatitis B.  You have had sex with someone who has hepatitis B.  You get hemodialysis treatment.  You take certain medicines for conditions, including cancer, organ transplantation, and autoimmune conditions. Hepatitis C  Blood testing is recommended for:  Everyone born from 1 through 1963.  Anyone with known risk factors for hepatitis C. Sexually transmitted infections (STIs)  You should be screened for sexually transmitted infections (STIs) including gonorrhea and chlamydia if:  You are sexually active and are younger than 78 years of age.  You are older than 78 years of age and your health care provider tells you that you are at risk for this type of infection.  Your sexual activity has changed since you were last screened and you are at an increased risk for chlamydia or gonorrhea. Ask your health care provider if you are at risk.  If you do not have HIV, but are at risk, it may be recommended that you take a prescription medicine daily to prevent HIV infection. This is called pre-exposure prophylaxis (PrEP). You are considered at risk if:  You are sexually active and do not regularly use condoms or know the HIV status of your partner(s).  You take drugs by injection.  You are sexually active with a partner who has HIV. Talk with your health  care provider about whether you are at high risk of being infected with HIV. If you choose to begin PrEP, you should first be tested for HIV. You should then be tested every 3 months for as long as you are taking PrEP.  PREGNANCY   If you are premenopausal and you  may become pregnant, ask your health care provider about preconception counseling.  If you may become pregnant, take 400 to 800 micrograms (mcg) of folic acid every day.  If you want to prevent pregnancy, talk to your health care provider about birth control (contraception). OSTEOPOROSIS AND MENOPAUSE   Osteoporosis is a disease in which the bones lose minerals and strength with aging. This can result in serious bone fractures. Your risk for osteoporosis can be identified using a bone density scan.  If you are 35 years of age or older, or if you are at risk for osteoporosis and fractures, ask your health care provider if you should be screened.  Ask your health care provider whether you should take a calcium or vitamin D supplement to lower your risk for osteoporosis.  Menopause may have certain physical symptoms and risks.  Hormone replacement therapy may reduce some of these symptoms and risks. Talk to your health care provider about whether hormone replacement therapy is right for you.  HOME CARE INSTRUCTIONS   Schedule regular health, dental, and eye exams.  Stay current with your immunizations.   Do not use any tobacco products including cigarettes, chewing tobacco, or electronic cigarettes.  If you are pregnant, do not drink alcohol.  If you are breastfeeding, limit how much and how often you drink alcohol.  Limit alcohol intake to no more than 1 drink per day for nonpregnant women. One drink equals 12 ounces of beer, 5 ounces of wine, or 1 ounces of hard liquor.  Do not use street drugs.  Do not share needles.  Ask your health care provider for help if you need support or information about quitting  drugs.  Tell your health care provider if you often feel depressed.  Tell your health care provider if you have ever been abused or do not feel safe at home. Document Released: 05/11/2011 Document Revised: 03/12/2014 Document Reviewed: 09/27/2013 [facility] Patient Information 2015 [place], [place]. This information is not intended to replace advice given to you by your health care provider. Make sure you discuss any questions you have with your health care provider.

## 2015-04-25 NOTE — Assessment & Plan Note (Signed)
Doing well with oxybutynin.

## 2015-04-25 NOTE — Assessment & Plan Note (Signed)
Up to date, prevnar given today to complete pneumonia series. Colonoscopy up to date and mammogram. Health screening 10 year recommendation given at visit to patient. Advanced directives discussed and she plans to discuss with her daughter when she is in town. BP elevated initially and never high before. Repeat better and will watch at next visit.

## 2015-05-15 ENCOUNTER — Telehealth: Payer: Self-pay | Admitting: Podiatry

## 2015-05-15 NOTE — Telephone Encounter (Signed)
Patient came in today and stated that her left foot is still hurting from her last visit with [place] in May. She would like to see if we could prescribe a walking boot for her. Please advise.

## 2015-05-15 NOTE — Telephone Encounter (Signed)
Left pt a message requesting she come in for an appt.

## 2015-05-16 ENCOUNTER — Encounter: Payer: Self-pay | Admitting: Podiatry

## 2015-05-16 ENCOUNTER — Ambulatory Visit (INDEPENDENT_AMBULATORY_CARE_PROVIDER_SITE_OTHER): Payer: Medicare Other | Admitting: Podiatry

## 2015-05-16 VITALS — BP 139/82 | HR 78 | Resp 12

## 2015-05-16 DIAGNOSIS — M722 Plantar fascial fibromatosis: Secondary | ICD-10-CM

## 2015-05-16 MED ORDER — TRIAMCINOLONE ACETONIDE 10 MG/ML IJ SUSP
10.0000 mg | Freq: Once | INTRAMUSCULAR | Status: AC
  Administered 2015-05-16: 10 mg

## 2015-05-16 NOTE — Progress Notes (Signed)
Subjective:     Patient ID: Emily Bentley, female   DOB: [birth year], 80 y.o.   MRN: [id number]  HPI patient states my left heel is still sore in the medial band and it's worse when I get up in the morning or after sitting and I bar odor good from a friend and that seems to have given me some relief   Review of Systems     Objective:   Physical Exam Neurovascular status intact muscle strength adequate with continued discomfort in the medial band left plantar fascia with moderate edema noted. Patient is have moderate depression of the arch    Assessment:     Plantar fasciitis left still present    Plan:     Advised on continued boot usage for the next week or 2 and I did dispense a night splint with instructions as she reduces the boot usage. Injected the left plantar fascia 3 mg Kenalog 5 mill grams Xylocaine and reappoint in 4 weeks and discuss considerations for shockwave if symptoms persist

## 2015-06-05 ENCOUNTER — Ambulatory Visit: Payer: Medicare Other | Admitting: Podiatry

## 2015-06-13 ENCOUNTER — Ambulatory Visit (INDEPENDENT_AMBULATORY_CARE_PROVIDER_SITE_OTHER): Payer: Medicare Other | Admitting: Podiatry

## 2015-06-13 ENCOUNTER — Encounter: Payer: Self-pay | Admitting: Podiatry

## 2015-06-13 VITALS — BP 137/74 | HR 62 | Resp 15

## 2015-06-13 DIAGNOSIS — M779 Enthesopathy, unspecified: Secondary | ICD-10-CM

## 2015-06-13 DIAGNOSIS — M722 Plantar fascial fibromatosis: Secondary | ICD-10-CM

## 2015-06-13 MED ORDER — TRIAMCINOLONE ACETONIDE 10 MG/ML IJ SUSP
10.0000 mg | Freq: Once | INTRAMUSCULAR | Status: AC
  Administered 2015-06-13: 10 mg

## 2015-06-13 NOTE — Progress Notes (Signed)
Subjective:     Patient ID: Emily Bentley, female   DOB: [birth year], 80 y.o.   MRN: [id number]  HPI patient continues to have severe pain plantar aspect left heel at the insertion with fluid buildup and states she cannot work or do anything of duration because of the pain   Review of Systems     Objective:   Physical Exam Neurovascular status found to be intact with significant discomfort medial fascial band left insertional point tendon calcaneus with fluid buildup noted at the insertion    Assessment:     Chronic plantar fasciitis left with failure to respond to conservative care    Plan:     She's been using the boot from a friend the cannot use it anymore and wants boot as it does seem to help her. I dispensed a short air fracture walker to her today and we discussed shockwave therapy for the left heel and she wants this done and is scheduled for shockwave therapy

## 2015-06-20 ENCOUNTER — Encounter: Payer: Self-pay | Admitting: Podiatry

## 2015-06-20 ENCOUNTER — Ambulatory Visit (INDEPENDENT_AMBULATORY_CARE_PROVIDER_SITE_OTHER): Payer: Medicare Other | Admitting: Podiatry

## 2015-06-20 DIAGNOSIS — M722 Plantar fascial fibromatosis: Secondary | ICD-10-CM

## 2015-06-20 NOTE — Progress Notes (Signed)
Subjective:     Patient ID: Emily Bentley, female   DOB: [birth year], 80 y.o.   MRN: [id number]  HPI patient presents stating my left heel has been sore   Review of Systems     Objective:   Physical Exam Neurovascular status intact with discomfort plantar left heel at the insertional point tendon calcaneus    Assessment:     Plantar fasciitis left    Plan:     Shockwave therapy administered 3000 shocks at 3.6 intensity 17 frequency tolerated well reappoint one week

## 2015-06-27 ENCOUNTER — Encounter: Payer: Self-pay | Admitting: Podiatry

## 2015-06-27 ENCOUNTER — Ambulatory Visit (INDEPENDENT_AMBULATORY_CARE_PROVIDER_SITE_OTHER): Payer: Medicare Other | Admitting: Podiatry

## 2015-06-27 VITALS — BP 134/85 | HR 73 | Resp 16

## 2015-06-27 DIAGNOSIS — M722 Plantar fascial fibromatosis: Secondary | ICD-10-CM

## 2015-06-27 NOTE — Progress Notes (Signed)
Subjective:     Patient ID: Emily Bentley, female   DOB: [birth year], 80 y.o.   MRN: [id number]  HPI patient states it is feeling some better   Review of Systems     Objective:   Physical Exam Neurovascular status is intact with diminished discomfort left plantar fascia    Assessment:     Improving with shockwave    Plan:     Administered shockwave 3000 shocks 17 frequency 4.6 intensity and 1000 shocks to the arch tolerated well and reappoint one week

## 2015-07-04 ENCOUNTER — Ambulatory Visit: Payer: Medicare Other | Admitting: Podiatry

## 2015-07-16 ENCOUNTER — Ambulatory Visit (INDEPENDENT_AMBULATORY_CARE_PROVIDER_SITE_OTHER): Payer: Medicare Other | Admitting: Internal Medicine

## 2015-07-16 ENCOUNTER — Ambulatory Visit (INDEPENDENT_AMBULATORY_CARE_PROVIDER_SITE_OTHER): Payer: Medicare Other

## 2015-07-16 VITALS — BP 118/74 | HR 88 | Temp 98.7°F | Resp 18 | Ht 62.0 in | Wt 170.1 lb

## 2015-07-16 DIAGNOSIS — R350 Frequency of micturition: Secondary | ICD-10-CM | POA: Diagnosis not present

## 2015-07-16 DIAGNOSIS — M5442 Lumbago with sciatica, left side: Secondary | ICD-10-CM

## 2015-07-16 DIAGNOSIS — M47815 Spondylosis without myelopathy or radiculopathy, thoracolumbar region: Secondary | ICD-10-CM | POA: Diagnosis not present

## 2015-07-16 LAB — POCT UA - MICROSCOPIC ONLY
BACTERIA, U MICROSCOPIC: NEGATIVE
CASTS, UR, LPF, POC: NEGATIVE
CRYSTALS, UR, HPF, POC: NEGATIVE
MUCUS UA: NEGATIVE
Yeast, UA: NEGATIVE

## 2015-07-16 LAB — POCT URINALYSIS DIPSTICK
BILIRUBIN UA: NEGATIVE
Glucose, UA: NEGATIVE
KETONES UA: NEGATIVE
Nitrite, UA: NEGATIVE
Protein, UA: NEGATIVE
Spec Grav, UA: 1.015
Urobilinogen, UA: 0.2
pH, UA: 8

## 2015-07-16 MED ORDER — IBUPROFEN 600 MG PO TABS: 600.0000 mg | ORAL_TABLET | Freq: Three times a day (TID) | ORAL | Status: DC | PRN

## 2015-07-16 MED ORDER — METHOCARBAMOL 750 MG PO TABS: 750.0000 mg | ORAL_TABLET | Freq: Four times a day (QID) | ORAL | Status: DC

## 2015-07-16 NOTE — Progress Notes (Signed)
Patient ID: Emily Bentley, female   DOB: 1935/06/03, 80 y.o.   MRN: [id number]   07/16/2015 at 9:08 AM  Emily Bentley / DOB: 08-Nov-1935 / MRN: [id number]  Problem list reviewed and updated by me where necessary.   SUBJECTIVE  Emily Bentley is a 80 y.o. well appearing female presenting for the chief complaint of new left low back pain.. No weakness, numbness but left rad to hip. Has frequency only. No fever, nite sweats, weight loss or anorexia.    She  has a past medical history of Watermelon stomach; Peptic stricture of esophagus; GERD (gastroesophageal reflux disease); Chronic allergic rhinitis; Hormone replacement therapy (postmenopausal); History of PSVT (paroxysmal supraventricular tachycardia); History of pneumonia; History of colon polyps; Hearing impairment; History of bursitis; Hiatal hernia; Diverticulosis of colon (without mention of hemorrhage); IBS (irritable bowel syndrome); Allergy; Arthritis; Asthma; and Cataract.    Medications reviewed and updated by myself where necessary, and exist elsewhere in the encounter.   [name] is allergic to sulfamethoxazole-trimethoprim. She  reports that she has never smoked. She has never used smokeless tobacco. She reports that she does not drink alcohol or use illicit drugs. She  has no sexual activity history on file. 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  Her family history includes Coronary artery disease in her father; Diabetes in her mother; Hypertension in her father; Uterine cancer in her mother. There is no history of Colon cancer.  Review of Systems  Constitutional: Negative for fever.  Respiratory: Negative for shortness of breath.   Cardiovascular: Negative for chest pain.  Gastrointestinal: Positive for constipation. Negative  for nausea.  Genitourinary: Positive for frequency. Negative for dysuria, urgency, hematuria and flank pain.  Musculoskeletal: Positive for back pain. Negative for joint pain.  Skin: Negative for rash.  Neurological: Negative for dizziness, sensory change, focal weakness and headaches.    OBJECTIVE  Her  height is 5\' 2"  (1.575 m) and weight is 170 lb 1.6 oz (77.157 kg). Her oral temperature is 98.7 F (37.1 C). Her blood pressure is 118/74 and her pulse is 88. Her respiration is 18 and oxygen saturation is 98%.  The patient's body mass index is 31.1 kg/(m^2).  Physical Exam  Constitutional: She is oriented to person, place, and time. She appears well-developed and well-nourished.  HENT:  Head: Normocephalic.  Nose: Nose normal.  Eyes: Conjunctivae and EOM are normal. Pupils are equal, round, and reactive to light. No scleral icterus.  Neck: Normal range of motion.  Cardiovascular: Normal rate, regular rhythm and normal heart sounds.   Respiratory: Effort normal and breath sounds normal.  GI: Soft. She exhibits no distension and no mass. There is no tenderness. There is no rebound and no guarding.  Musculoskeletal: Normal range of motion. She exhibits tenderness.       Lumbar back: She exhibits tenderness, pain and spasm. She exhibits normal range of motion, no bony tenderness, no swelling, no edema, no deformity and normal pulse.  Neurological: She is alert and oriented to person, place, and time. She has normal strength. She displays normal reflexes. No sensory deficit. She exhibits normal muscle tone. Coordination and gait normal.  Reflex Scores:      Patellar reflexes are 2+ on the right side and 2+ on the left side.      Achilles reflexes are 0 on the right side and 0  on the left side. Skin: Skin is warm and dry.  Psychiatric: She has a normal mood and affect. Her behavior is normal.  [facility] reading (PRIMARY) by  Dr.Katrice Goel.moderate spondylosis and DDD except severe DDD at  L5-S1.    Results for orders placed or performed in visit on 07/16/15 (from the past 24 hour(s))  POCT urinalysis dipstick     Status: Abnormal   Collection Time: 07/16/15  8:56 AM  Result Value Ref Range   Color, UA yellow    Clarity, UA clear    Glucose, UA neg    Bilirubin, UA neg    Ketones, UA neg    Spec Grav, UA 1.015    Blood, UA tr-lysed    pH, UA 8.0    Protein, UA neg    Urobilinogen, UA 0.2    Nitrite, UA neg    Leukocytes, UA small (1+) (A) Negative  POCT UA - Microscopic Only     Status: None   Collection Time: 07/16/15  8:56 AM  Result Value Ref Range   WBC, Ur, HPF, POC 0-3    RBC, urine, microscopic 0-3    Bacteria, U Microscopic neg    Mucus, UA neg    Epithelial cells, urine per micros 0-3    Crystals, Ur, HPF, POC neg    Casts, Ur, LPF, POC neg    Yeast, UA neg    Renal tubular cells 0-1     ASSESSMENT & PLAN  Nakeia was seen today for back pain.  Diagnoses and all orders for this visit:  Left-sided low back pain with left-sided sciatica -     POCT urinalysis dipstick -     POCT UA - Microscopic Only -     DG Lumbar Spine Complete  Frequency of urination -     POCT urinalysis dipstick -     POCT UA - Microscopic Only -     DG Lumbar Spine Complete

## 2015-07-16 NOTE — Patient Instructions (Addendum)
Chronic Back Pain  When back pain lasts longer than 3 months, it is called chronic back pain.People with chronic back pain often go through certain periods that are more intense (flare-ups).  CAUSES Chronic back pain can be caused by wear and tear (degeneration) on different structures in your back. These structures include:  The bones of your spine (vertebrae) and the joints surrounding your spinal cord and nerve roots (facets).  The strong, fibrous tissues that connect your vertebrae (ligaments). Degeneration of these structures may result in pressure on your nerves. This can lead to constant pain. HOME CARE INSTRUCTIONS  Avoid bending, heavy lifting, prolonged sitting, and activities which make the problem worse.  Take brief periods of rest throughout the day to reduce your pain. Lying down or standing usually is better than sitting while you are resting.  Take over-the-counter or prescription medicines only as directed by your caregiver. SEEK IMMEDIATE MEDICAL CARE IF:   You have weakness or numbness in one of your legs or feet.  You have trouble controlling your bladder or bowels.  You have nausea, vomiting, abdominal pain, shortness of breath, or fainting. Document Released: 12/03/2004 Document Revised: 01/18/2012 Document Reviewed: 10/10/2011 [facility] Patient Information 2015 [place], [place]. This information is not intended to replace advice given to you by your health care provider. Make sure you discuss any questions you have with your health care provider. Osteoarthritis Osteoarthritis is a disease that causes soreness and inflammation of a joint. It occurs when the cartilage at the affected joint wears down. Cartilage acts as a cushion, covering the ends of bones where they meet to form a joint. Osteoarthritis is the most common form of arthritis. It often occurs in older people. The joints affected most often by this condition include those in the:  Ends of the  fingers.  Thumbs.  Neck.  Lower back.  Knees.  Hips. CAUSES  Over time, the cartilage that covers the ends of bones begins to wear away. This causes bone to rub on bone, producing pain and stiffness in the affected joints.  RISK FACTORS Certain factors can increase your chances of having osteoarthritis, including:  Older age.  Excessive body weight.  Overuse of joints.  Previous joint injury. SIGNS AND SYMPTOMS   Pain, swelling, and stiffness in the joint.  Over time, the joint may lose its normal shape.  Small deposits of bone (osteophytes) may grow on the edges of the joint.  Bits of bone or cartilage can break off and float inside the joint space. This may cause more pain and damage. DIAGNOSIS  Your health care provider will do a physical exam and ask about your symptoms. Various tests may be ordered, such as:  X-rays of the affected joint.  An MRI scan.  Blood tests to rule out other types of arthritis.  Joint fluid tests. This involves using a needle to draw fluid from the joint and examining the fluid under a microscope. TREATMENT  Goals of treatment are to control pain and improve joint function. Treatment plans may include:  A prescribed exercise program that allows for rest and joint relief.  A weight control plan.  Pain relief techniques, such as:  Properly applied heat and cold.  Electric pulses delivered to nerve endings under the skin (transcutaneous electrical nerve stimulation [TENS]).  Massage.  Certain nutritional supplements.  Medicines to control pain, such as:  Acetaminophen.  Nonsteroidal anti-inflammatory drugs (NSAIDs), such as naproxen.  Narcotic or central-acting agents, such as tramadol.  Corticosteroids. These can  be given orally or as an injection.  Surgery to reposition the bones and relieve pain (osteotomy) or to remove loose pieces of bone and cartilage. Joint replacement may be needed in advanced states of  osteoarthritis. HOME CARE INSTRUCTIONS   Take medicines only as directed by your health care provider.  Maintain a healthy weight. Follow your health care provider's instructions for weight control. This may include dietary instructions.  Exercise as directed. Your health care provider can recommend specific types of exercise. These may include:  Strengthening exercises. These are done to strengthen the muscles that support joints affected by arthritis. They can be performed with weights or with exercise bands to add resistance.  Aerobic activities. These are exercises, such as brisk walking or low-impact aerobics, that get your heart pumping.  Range-of-motion activities. These keep your joints limber.  Balance and agility exercises. These help you maintain daily living skills.  Rest your affected joints as directed by your health care provider.  Keep all follow-up visits as directed by your health care provider. SEEK MEDICAL CARE IF:   Your skin turns red.  You develop a rash in addition to your joint pain.  You have worsening joint pain.  You have a fever along with joint or muscle aches. SEEK IMMEDIATE MEDICAL CARE IF:  You have a significant loss of weight or appetite.  You have night sweats. [place] of Arthritis and Musculoskeletal and Skin Diseases: www.niams.SouthExposed.es  [employer] on Aging: http://kim-miller.com/  American College of Rheumatology: www.rheumatology.org Document Released: 10/26/2005 Document Revised: 03/12/2014 Document Reviewed: 07/03/2013 [facility] Patient Information 2015 [place], [place]. This information is not intended to replace advice given to you by your health care provider. Make sure you discuss any questions you have with your health care provider.

## 2015-07-18 ENCOUNTER — Encounter: Payer: Self-pay | Admitting: Podiatry

## 2015-07-18 ENCOUNTER — Ambulatory Visit (INDEPENDENT_AMBULATORY_CARE_PROVIDER_SITE_OTHER): Payer: Medicare Other | Admitting: Podiatry

## 2015-07-18 DIAGNOSIS — M722 Plantar fascial fibromatosis: Secondary | ICD-10-CM

## 2015-07-18 NOTE — Progress Notes (Signed)
Subjective:     Patient ID: Emily Bentley, female   DOB: Nov 10, 1934, 80 y.o.   MRN: [id number]  HPI patient presents for shockwave #3 stating she's doing great   Review of Systems     Objective:   Physical Exam Neurovascular status intact continued discomfort plantar fascial left but significant improvement    Assessment:     Improved plantar fasciitis left with mild discomfort    Plan:     Shockwave administered 2500-3000 shocks at 17 frequency 4.6 intensity. Reappoint to recheck as needed

## 2015-07-22 ENCOUNTER — Ambulatory Visit (INDEPENDENT_AMBULATORY_CARE_PROVIDER_SITE_OTHER): Payer: Medicare Other | Admitting: Internal Medicine

## 2015-07-22 ENCOUNTER — Encounter: Payer: Self-pay | Admitting: Internal Medicine

## 2015-07-22 VITALS — BP 120/90 | HR 84 | Temp 98.3°F | Resp 16 | Wt 171.1 lb

## 2015-07-22 DIAGNOSIS — M545 Low back pain, unspecified: Secondary | ICD-10-CM

## 2015-07-22 DIAGNOSIS — R1031 Right lower quadrant pain: Secondary | ICD-10-CM | POA: Diagnosis not present

## 2015-07-22 MED ORDER — METHYLPREDNISOLONE ACETATE 40 MG/ML IJ SUSP
40.0000 mg | Freq: Once | INTRAMUSCULAR | Status: AC
  Administered 2015-07-22: 40 mg via INTRAMUSCULAR

## 2015-07-22 NOTE — Progress Notes (Signed)
Pre visit review using our clinic review tool, if applicable. No additional management support is needed unless otherwise documented below in the visit note. 

## 2015-07-22 NOTE — Patient Instructions (Signed)
We have given you the steroid shot in the muscle to help with the pain. It may take 1-2 days to work, if you are 100% better before getting the CT scan of the stomach then you do not need it and can call and cancel.  We will get the test of the stomach this week.

## 2015-07-22 NOTE — Progress Notes (Signed)
   Subjective:    Patient ID: Emily Bentley, female    DOB: 07/11/1935, 80 y.o.   MRN: [id number]  HPI The patient is a 80 YO female who is coming in today with RLQ pain and low back pain. Denies burning with urination, blood in urine (was positive in the ER recently). She was seen in the ER for the same complaint and they gave her a muscle relaxer and NSAIDs and this did not help at all. It has been present for the last several weeks and is worsening. Pain does not radiate into her legs, no numbness in her legs. Some mild constipation which is chronic and unchanged. Last BM within last 1-2 days, passing gas. Denies nausea or vomiting. Denies history of kidney stones.   Review of Systems  Constitutional: Negative for fever, activity change, appetite change and fatigue.  HENT: Positive for hearing loss. Negative for ear discharge, ear pain, rhinorrhea, sinus pressure and tinnitus.   Respiratory: Negative for cough, chest tightness, shortness of breath and wheezing.   Cardiovascular: Positive for chest pain. Negative for palpitations and leg swelling.  Gastrointestinal: Positive for abdominal pain. Negative for diarrhea, constipation, blood in stool and abdominal distention.  Musculoskeletal: Positive for back pain. Negative for myalgias and gait problem.  Skin: Negative.   Neurological: Negative.       Objective:   Physical Exam  Constitutional: She is oriented to person, place, and time. She appears well-developed and well-nourished.  HENT:  Head: Normocephalic and atraumatic.  Eyes: EOM are normal.  Neck: Normal range of motion.  Cardiovascular: Normal rate and regular rhythm.   Pulmonary/Chest: Effort normal and breath sounds normal. No respiratory distress. She has no wheezes. She has no rales.  Abdominal: Soft. She exhibits no distension. There is tenderness. There is no rebound and no guarding.  Some tenderness in the low back with radiation to the RLQ. No rebound or guarding.     Musculoskeletal: She exhibits no edema.  Neurological: She is alert and oriented to person, place, and time. Coordination normal.  Skin: Skin is warm and dry.   Filed Vitals:   07/22/15 1519  BP: 120/90  Pulse: 84  Temp: 98.3 F (36.8 C)  TempSrc: Oral  Resp: 16  Weight: 171 lb 1.9 oz (77.62 kg)  SpO2: 97%      Assessment & Plan:

## 2015-07-23 NOTE — Assessment & Plan Note (Addendum)
Unclear if this back pain is related to her abdomen, several times recently with blood trace in her urine which is more concerning for kidney stones with the low back pain and the radiation to the RLQ. Checking CT abdomen/pelvis no contrast. IM depo-medrol 40 mg given today to try to distinguish from low back pain and abdominal pain. If she improves then can cancel the CT abdomen/pelvis. Has tried and failed celebrex, robaxin, tramadol for pain.

## 2015-07-25 ENCOUNTER — Ambulatory Visit (INDEPENDENT_AMBULATORY_CARE_PROVIDER_SITE_OTHER)
Admission: RE | Admit: 2015-07-25 | Discharge: 2015-07-25 | Disposition: A | Discharge status: Home / Self Care | Payer: Medicare Other | Source: Ambulatory Visit | Attending: Internal Medicine | Admitting: Internal Medicine

## 2015-07-25 DIAGNOSIS — R1031 Right lower quadrant pain: Secondary | ICD-10-CM | POA: Diagnosis not present

## 2015-07-25 DIAGNOSIS — R319 Hematuria, unspecified: Secondary | ICD-10-CM | POA: Diagnosis not present

## 2015-08-01 ENCOUNTER — Ambulatory Visit: Payer: Medicare Other | Admitting: Internal Medicine

## 2015-08-13 ENCOUNTER — Encounter: Payer: Medicare Other | Admitting: Internal Medicine

## 2015-08-29 ENCOUNTER — Ambulatory Visit (INDEPENDENT_AMBULATORY_CARE_PROVIDER_SITE_OTHER): Payer: Medicare Other | Admitting: Internal Medicine

## 2015-08-29 ENCOUNTER — Encounter: Payer: Self-pay | Admitting: Internal Medicine

## 2015-08-29 VITALS — BP 138/88 | HR 77 | Temp 97.8°F | Resp 14 | Ht 63.0 in | Wt 172.1 lb

## 2015-08-29 DIAGNOSIS — M545 Low back pain: Secondary | ICD-10-CM

## 2015-08-29 DIAGNOSIS — Z23 Encounter for immunization: Secondary | ICD-10-CM | POA: Diagnosis not present

## 2015-08-29 DIAGNOSIS — M25511 Pain in right shoulder: Secondary | ICD-10-CM | POA: Diagnosis not present

## 2015-08-29 DIAGNOSIS — G8929 Other chronic pain: Secondary | ICD-10-CM

## 2015-08-29 MED ORDER — TRAMADOL HCL 50 MG PO TABS: 25.0000 mg | ORAL_TABLET | Freq: Every day | ORAL | Status: DC | PRN

## 2015-08-29 NOTE — Progress Notes (Signed)
Pre visit review using our clinic review tool, if applicable. No additional management support is needed unless otherwise documented below in the visit note. 

## 2015-08-29 NOTE — Patient Instructions (Signed)
We would like you to go see Dr. Tamala Julian to see if he can get you to be much better with the pain without having to be dependent on medications for your pain.   It is okay to use the tramadol for pain since it is helping.

## 2015-08-29 NOTE — Progress Notes (Signed)
   Subjective:    Patient ID: Emily Bentley, female    DOB: [birth year], 80 y.o.   MRN: [id number]  HPI The patient is a 80 YO female coming in for lower back pain and shoulder pain. The lower back pain is slightly better than at last visit. She does have pain only on the days she has to work. She does a lot of walking and activity at her job. She is using a tramadol in the morning and 2 alleve in the evening to get to sleep. She does not get drowsy or confused with the tramadol. After a day where she needs medicine she often does not for a day or two. No numbness or weakness in her legs.  The shoulder pain is new in the last 1-2 weeks and she has had that in the past. She thought it was bursitis. She got a cortisone injection in it 2 years ago and that has helped since then. Impacting her ROM and slightly painful with lifting above her head. Has not taken medicine for it but the medicine for her back has also helped with this. About the same since it started, not getting better or worse.   Review of Systems  Constitutional: Negative for fever, activity change, appetite change and fatigue.  HENT: Positive for hearing loss. Negative for ear discharge, ear pain, rhinorrhea, sinus pressure and tinnitus.   Respiratory: Negative for cough, chest tightness, shortness of breath and wheezing.   Cardiovascular: Negative for chest pain, palpitations and leg swelling.  Gastrointestinal: Negative for abdominal pain, diarrhea, constipation, blood in stool and abdominal distention.  Musculoskeletal: Positive for back pain and arthralgias. Negative for myalgias and gait problem.  Skin: Negative.   Neurological: Negative.       Objective:   Physical Exam  Constitutional: She is oriented to person, place, and time. She appears well-developed and well-nourished.  HENT:  Head: Normocephalic and atraumatic.  Eyes: EOM are normal.  Neck: Normal range of motion.  Cardiovascular: Normal rate and regular rhythm.    Pulmonary/Chest: Effort normal and breath sounds normal. No respiratory distress. She has no wheezes. She has no rales.  Abdominal: Soft. She exhibits no distension. There is no tenderness. There is no rebound and no guarding.  Some tenderness in the low back without radiation to the RLQ. No rebound or guarding.   Musculoskeletal: She exhibits tenderness. She exhibits no edema.  Neurological: She is alert and oriented to person, place, and time. Coordination normal.  Skin: Skin is warm and dry.   Filed Vitals:   08/29/15 0814  BP: 160/90  Pulse: 77  Temp: 97.8 F (36.6 C)  TempSrc: Oral  Resp: 14  Height: 5\' 3"  (1.6 m)  Weight: 172 lb 1.9 oz (78.073 kg)  SpO2: 98%      Assessment & Plan:  Flu shot given at visit.

## 2015-08-29 NOTE — Assessment & Plan Note (Signed)
Will have her see Dr. Tamala Julian for some therapeutic exercises. Suspect that her work is related to her pain since she does not hurt on days she does not work. She intends to work through the year and we will support her goal. Okay with continuing tramadol for pain daily as needed.

## 2015-08-29 NOTE — Assessment & Plan Note (Signed)
Will have her see Dr. Tamala Julian for injection in the shoulder. Adequate pain control at this time.

## 2015-09-16 ENCOUNTER — Ambulatory Visit (INDEPENDENT_AMBULATORY_CARE_PROVIDER_SITE_OTHER): Payer: Medicare Other | Admitting: Family Medicine

## 2015-09-16 ENCOUNTER — Encounter: Payer: Self-pay | Admitting: Family Medicine

## 2015-09-16 VITALS — BP 130/84 | HR 86 | Ht 63.0 in | Wt 169.0 lb

## 2015-09-16 DIAGNOSIS — M25511 Pain in right shoulder: Secondary | ICD-10-CM

## 2015-09-16 DIAGNOSIS — M5136 Other intervertebral disc degeneration, lumbar region: Secondary | ICD-10-CM | POA: Diagnosis not present

## 2015-09-16 NOTE — Patient Instructions (Addendum)
Good to see you.  Ice 20 minutes 2 times daily. Usually after activity and before bed. Exercises 3 times a week.  Take tylenol 650 mg three times a day is the best evidence based medicine we have for arthritis. Can take with one pain pill if really needed Vitamin D 2000 IU daily (double what you have been taking) Fish oil 2 grams daily.  Tumeric 500mg  twice daily.  pennsaid pinkie amount topically 2 times daily as needed.   It's important that you continue to stay active. Controlling your weight is important.  Consider physical therapy to strengthen muscles around the joint that hurts to take pressure off of the joint itself. Good shoes with rigid bottom.  Emily Bentley, [place] or New balance greater then [employer] and cycling with low resistance are the best two types of exercise for arthritis. Come back and see me in 4 weeks.

## 2015-09-16 NOTE — Assessment & Plan Note (Signed)
Does have mild impingement. No weakness noted. Good range of motion. We discussed different treatment options and patient has elected try topical anti-implant ports, icing, home exercises and patient come back again in 3-4 weeks for further evaluation and treatment.

## 2015-09-16 NOTE — Progress Notes (Signed)
[name] [address] [place], [place] [address] Phone: [phone] Subjective:    I'm seeing this patient by the request  of:  [name], MD   CC: Low back pain. Right shoulder pain  [id number] Emily Bentley is a 80 y.o. female coming in with complaint of low back pain. Patient states that she is a very active individual who continues to work full-time position. Patient states this started in September she started having increasing low back pain. States that now it seems to be more severe and chronic. Seems to only respond well to anti-inflammatories and regular scheduled tramadol 2 times a day. Patient states that this allows her to continue to do her daily activities. Sometimes can have muscle spasms but does not tolerate muscle relaxers well. Patient denies any radiation down the legs any numbness or tingling. Denies any weakness of the lower extremity. Sometimes difficult to get comfortable at night. Denies any fever, chills, any abnormal weight loss. Patient also complains of some mild right shoulder pain. Nothing that is stopping her from activity. States that certain movements can give her a sharp stabbing pain. States that when it occurs is 8 out of 10 but only last seconds. Sometimes can be uncomfortable at night. Denies any association with such as running or walking but only when lifting heavy objects. Does not remember any true injury. Has not tried anything other than Aleve which has been helpful.    Past Medical History  Diagnosis Date  . Watermelon stomach   . Peptic stricture of esophagus   . GERD (gastroesophageal reflux disease)   . Chronic allergic rhinitis     with asthmatic component  . Hormone replacement therapy (postmenopausal)   . History of PSVT (paroxysmal supraventricular tachycardia)   . History of pneumonia   . History of colon polyps   . Hearing impairment   . History of bursitis     right shoulder  .  Hiatal hernia   . Diverticulosis of colon (without mention of hemorrhage)   . IBS (irritable bowel syndrome)   . Allergy   . Arthritis   . Asthma   . Cataract    Past Surgical History  Procedure Laterality Date  . Polypectomy    . Appendectomy    . Urethral dilatations    . Tubal ligation    . Abdominal hysterectomy    . Cholecystectomy    . Ventricular ablation surgery    . Cochlear implant Left Oct '14    Done at [facility] - great results  . Eye surgery     Social History  Substance Use Topics  . Smoking status: Never Smoker   . Smokeless tobacco: Never Used  . Alcohol Use: No   Allergies  Allergen Reactions  . Sulfamethoxazole-Trimethoprim    Family History  Problem Relation Age of Onset  . Diabetes Mother   . Coronary artery disease Father     CABG x 5 in 1985  . Hypertension Father   . Uterine cancer Mother   . Colon cancer Neg Hx      Past medical history, social, surgical and family history all reviewed in electronic medical record.   Patient did have lumbar x-rays are reviewed by me. X-rays on 07/16/2015 shows the patient does have degenerative disc disease most remarkably at L5-S1 with a 4 mm anterior listhesis of L4 on L5 significant facet arthropathy. Moderate to severe arthritic changes of the sacroiliac joints as well.  Review of Systems: No headache, visual changes, nausea, vomiting, diarrhea, constipation, dizziness, abdominal pain, skin rash, fevers, chills, night sweats, weight loss, swollen lymph nodes, body aches, joint swelling, muscle aches, chest pain, shortness of breath, mood changes.   Objective Blood pressure 130/84, pulse 86, height 5\' 3"  (1.6 m), weight 169 lb (76.658 kg), SpO2 96 %.  General: No apparent distress alert and oriented x3 mood and affect normal, dressed appropriately.  HEENT: Pupils equal, extraocular movements intact  Respiratory: Patient's speak in full sentences and does not appear short of breath    Cardiovascular: No lower extremity edema, non tender, no erythema  Skin: Warm dry intact with no signs of infection or rash on extremities or on axial skeleton.  Abdomen: Soft nontender  Neuro: Cranial nerves II through XII are intact, neurovascularly intact in all extremities with 2+ DTRs and 2+ pulses.  Lymph: No lymphadenopathy of posterior or anterior cervical chain or axillae bilaterally.  Gait normal with good balance and coordination.  MSK:  Non tender with full range of motion and good stability and symmetric strength and tone of  elbows, wrist, hip, knee and ankles bilaterally.   Back Exam:  Inspection: Unremarkable  Motion: Flexion 25 deg, Extension 25 deg, Side Bending to 25 deg bilaterally,  Rotation to 25 deg bilaterally  SLR laying: Negative  XSLR laying: Negative  Palpable tenderness: Tender to palpation over the repairs bone musculature on the left side as well as the left sacroiliac joint FABER: Positive left Sensory change: Gross sensation intact to all lumbar and sacral dermatomes.  Reflexes: 2+ at both patellar tendons, 2+ at achilles tendons, Babinski's downgoing.  Strength at foot  Plantar-flexion: 5/5 Dorsi-flexion: 5/5 Eversion: 5/5 Inversion: 5/5  Leg strength  Quad: 5/5 Hamstring: 5/5 Hip flexor: 5/5 Hip abductors: 3+/5 but symmetric Gait unremarkable.  Shoulder: Right Inspection reveals no abnormalities, atrophy or asymmetry. Palpation is normal with no tenderness over AC joint or bicipital groove. ROM is full in all planes passively. Rotator cuff strength normal throughout. signs of impingement with positive Neer and Hawkin's tests, but negative empty can sign. Speeds and Yergason's tests normal. No labral pathology noted with negative Obrien's, negative clunk and good stability. Normal scapular function observed. No painful arc and no drop arm sign. No apprehension sign Contralateral shoulder unremarkable  Procedure note 97110; 15 minutes spent for  Therapeutic exercises as stated in above notes.  This included exercises focusing on stretching, strengthening, with significant focus on eccentric aspects.  Pelvic tilt/bracing instruction to focus on control of the pelvic girdle and lower abdominal muscles  Glute strengthening exercises, focusing on proper firing of the glutes without engaging the low back muscles Proper stretching techniques for maximum relief for the hamstrings, hip flexors, low back and some rotation where tolerated Proper technique shown and discussed handout in great detail with ATC.  All questions were discussed and answered.     Impression and Recommendations:     This case required medical decision making of moderate complexity.

## 2015-09-16 NOTE — Progress Notes (Signed)
Pre visit review using our clinic review tool, if applicable. No additional management support is needed unless otherwise documented below in the visit note. 

## 2015-09-16 NOTE — Assessment & Plan Note (Signed)
Patient does have degenerative disc disease I think that this is going to be a long-standing problem. We discussed patient's prognosis likelihood of having some discomfort from time to time. We discussed which medications that she can use for breakthrough. We discussed core strengthening exercises. Patient work with Product/process development scientist today. We discussed icing regimen. We discussed topical anti-inflammatories in patient given a trial size. Patient will come back and see me again in 4 weeks to make sure she is responding appropriately.

## 2015-10-14 ENCOUNTER — Ambulatory Visit: Payer: Medicare Other | Admitting: Family Medicine

## 2015-12-16 DIAGNOSIS — Z961 Presence of intraocular lens: Secondary | ICD-10-CM | POA: Diagnosis not present

## 2015-12-16 DIAGNOSIS — H353131 Nonexudative age-related macular degeneration, bilateral, early dry stage: Secondary | ICD-10-CM | POA: Diagnosis not present

## 2016-01-03 DIAGNOSIS — Z1231 Encounter for screening mammogram for malignant neoplasm of breast: Secondary | ICD-10-CM | POA: Diagnosis not present

## 2016-01-03 LAB — HM MAMMOGRAPHY

## 2016-01-06 ENCOUNTER — Encounter: Payer: Self-pay | Admitting: Internal Medicine

## 2016-01-20 DIAGNOSIS — H353131 Nonexudative age-related macular degeneration, bilateral, early dry stage: Secondary | ICD-10-CM | POA: Diagnosis not present

## 2016-02-13 ENCOUNTER — Encounter: Payer: Self-pay | Admitting: Geriatric Medicine

## 2016-02-20 DIAGNOSIS — H903 Sensorineural hearing loss, bilateral: Secondary | ICD-10-CM | POA: Diagnosis not present

## 2016-03-28 DIAGNOSIS — H903 Sensorineural hearing loss, bilateral: Secondary | ICD-10-CM | POA: Diagnosis not present

## 2016-04-29 DIAGNOSIS — H353131 Nonexudative age-related macular degeneration, bilateral, early dry stage: Secondary | ICD-10-CM | POA: Diagnosis not present

## 2016-04-29 DIAGNOSIS — Z961 Presence of intraocular lens: Secondary | ICD-10-CM | POA: Diagnosis not present

## 2016-04-29 DIAGNOSIS — H1132 Conjunctival hemorrhage, left eye: Secondary | ICD-10-CM | POA: Diagnosis not present

## 2016-05-04 ENCOUNTER — Encounter: Payer: Self-pay | Admitting: Internal Medicine

## 2016-05-04 ENCOUNTER — Other Ambulatory Visit (INDEPENDENT_AMBULATORY_CARE_PROVIDER_SITE_OTHER): Payer: Medicare Other

## 2016-05-04 ENCOUNTER — Ambulatory Visit (INDEPENDENT_AMBULATORY_CARE_PROVIDER_SITE_OTHER): Payer: Medicare Other | Admitting: Internal Medicine

## 2016-05-04 VITALS — BP 144/94 | HR 85 | Temp 98.5°F | Resp 16 | Ht 63.0 in | Wt 171.0 lb

## 2016-05-04 DIAGNOSIS — M5136 Other intervertebral disc degeneration, lumbar region: Secondary | ICD-10-CM | POA: Diagnosis not present

## 2016-05-04 DIAGNOSIS — R1030 Lower abdominal pain, unspecified: Secondary | ICD-10-CM | POA: Diagnosis not present

## 2016-05-04 LAB — COMPREHENSIVE METABOLIC PANEL
ALBUMIN: 4 g/dL (ref 3.5–5.2)
ALK PHOS: 55 U/L (ref 39–117)
ALT: 11 U/L (ref 0–35)
AST: 22 U/L (ref 0–37)
BUN: 12 mg/dL (ref 6–23)
CALCIUM: 9.5 mg/dL (ref 8.4–10.5)
CO2: 30 mEq/L (ref 19–32)
Chloride: 104 mEq/L (ref 96–112)
Creatinine, Ser: 0.95 mg/dL (ref 0.40–1.20)
GFR: 73.07 mL/min (ref >60.00)
Glucose, Bld: 92 mg/dL (ref 70–99)
POTASSIUM: 4.3 meq/L (ref 3.5–5.1)
Sodium: 138 mEq/L (ref 135–145)
TOTAL PROTEIN: 7.1 g/dL (ref 6.0–8.3)
Total Bilirubin: 0.9 mg/dL (ref 0.2–1.2)

## 2016-05-04 LAB — URINALYSIS, ROUTINE W REFLEX MICROSCOPIC
BILIRUBIN URINE: NEGATIVE
KETONES UR: NEGATIVE
NITRITE: NEGATIVE
Specific Gravity, Urine: <=1.005 — AB (ref 1.000–1.030)
TOTAL PROTEIN, URINE-UPE24: NEGATIVE
Urine Glucose: NEGATIVE
Urobilinogen, UA: 0.2 (ref 0.0–1.0)
pH: 6 (ref 5.0–8.0)

## 2016-05-04 LAB — CBC
HCT: 41 % (ref 36.0–46.0)
HEMOGLOBIN: 13.3 g/dL (ref 12.0–15.0)
MCHC: 32.3 g/dL (ref 30.0–36.0)
MCV: 80.8 fl (ref 78.0–100.0)
PLATELETS: 223 10*3/uL (ref 150.0–400.0)
RBC: 5.08 Mil/uL (ref 3.87–5.11)
RDW: 14.3 % (ref 11.5–15.5)
WBC: 6.7 10*3/uL (ref 4.0–10.5)

## 2016-05-04 MED ORDER — ALPRAZOLAM 0.5 MG PO TABS: 0.5000 mg | ORAL_TABLET | Freq: Every evening | ORAL | Status: DC | PRN

## 2016-05-04 MED ORDER — TRAMADOL HCL 50 MG PO TABS: 25.0000 mg | ORAL_TABLET | Freq: Two times a day (BID) | ORAL | Status: DC

## 2016-05-04 MED ORDER — CELECOXIB 200 MG PO CAPS: 200.0000 mg | ORAL_CAPSULE | Freq: Two times a day (BID) | ORAL | Status: DC

## 2016-05-04 NOTE — Patient Instructions (Signed)
We can increase the pain medicine tramadol to 2 pills a day as needed (you can take 1 pill twice a day or 2 pills at one time).   We can also increase the celebrex to twice a day to see if this helps with your pain more.   We are checking the urine and the blood today for infection and causes of the low pain. If we do not find a good cause we will treat you for infection of the diverticulitis which can cause symptoms like yours.

## 2016-05-04 NOTE — Progress Notes (Signed)
Pre visit review using our clinic review tool, if applicable. No additional management support is needed unless otherwise documented below in the visit note. 

## 2016-05-05 DIAGNOSIS — R102 Pelvic and perineal pain: Secondary | ICD-10-CM | POA: Insufficient documentation

## 2016-05-05 MED ORDER — NITROFURANTOIN MONOHYD MACRO 100 MG PO CAPS: 100.0000 mg | ORAL_CAPSULE | Freq: Two times a day (BID) | ORAL | Status: DC

## 2016-05-05 NOTE — Assessment & Plan Note (Signed)
This is chronic and she is doing well with celebrex and tramadol. Likely the pain down the left leg is from this as well and reassured her no need for imaging today. No new injury or numbness or weakness.

## 2016-05-05 NOTE — Assessment & Plan Note (Signed)
Checking U/A and treat as appropriate. She does also have known diverticuli so could be diverticulitis as well since it is more bilateral. No other GI symptoms and no other clear GU symptoms.

## 2016-05-05 NOTE — Progress Notes (Signed)
   Subjective:    Patient ID: Emily Bentley, female    DOB: [birth year], 81 y.o.   MRN: [id number]  HPI The patient is a 81 YO female coming in for left leg and knee pain as well as pain in the low abdomen bilaterally. This started several weeks ago and is 6/10 pain. She is not sure that she did anything to cause it. No change in activity or diet. No diarrhea or constipation. No nausea or vomiting. No injury to her low back. Some frequency but no burning with urination. She has tried her tramadol which has helped some and the celebrex has helped some as well. No fevers or chills.   Review of Systems  Constitutional: Negative for fever, activity change, appetite change and fatigue.  HENT: Positive for hearing loss. Negative for ear discharge, ear pain, rhinorrhea, sinus pressure and tinnitus.   Respiratory: Negative for cough, chest tightness, shortness of breath and wheezing.   Cardiovascular: Negative for chest pain, palpitations and leg swelling.  Gastrointestinal: Positive for abdominal pain. Negative for diarrhea, constipation, blood in stool and abdominal distention.  Genitourinary: Positive for frequency. Negative for dysuria, urgency and difficulty urinating.  Musculoskeletal: Positive for back pain and arthralgias. Negative for myalgias and gait problem.  Skin: Negative.   Neurological: Negative.       Objective:   Physical Exam  Constitutional: She is oriented to person, place, and time. She appears well-developed and well-nourished.  HENT:  Head: Normocephalic and atraumatic.  Eyes: EOM are normal.  Neck: Normal range of motion.  Cardiovascular: Normal rate and regular rhythm.   Pulmonary/Chest: Effort normal and breath sounds normal. No respiratory distress. She has no wheezes. She has no rales.  Abdominal: Soft. She exhibits no distension. There is tenderness. There is no rebound and no guarding.  Some tenderness in the lower quadrant right and left, no rebound or guarding,  no radiation to the back  Musculoskeletal: She exhibits tenderness. She exhibits no edema.  Neurological: She is alert and oriented to person, place, and time. Coordination normal.  Skin: Skin is warm and dry.   Filed Vitals:   05/04/16 1433  BP: 144/94  Pulse: 85  Temp: 98.5 F (36.9 C)  TempSrc: Oral  Resp: 16  Height: 5\' 3"  (1.6 m)  Weight: 171 lb (77.565 kg)  SpO2: 98%      Assessment & Plan:

## 2016-05-13 DIAGNOSIS — H903 Sensorineural hearing loss, bilateral: Secondary | ICD-10-CM | POA: Diagnosis not present

## 2016-07-25 ENCOUNTER — Other Ambulatory Visit: Payer: Self-pay | Admitting: Internal Medicine

## 2016-07-25 DIAGNOSIS — M5442 Lumbago with sciatica, left side: Secondary | ICD-10-CM

## 2016-08-05 ENCOUNTER — Encounter: Payer: Self-pay | Admitting: Family Medicine

## 2016-08-05 ENCOUNTER — Ambulatory Visit (INDEPENDENT_AMBULATORY_CARE_PROVIDER_SITE_OTHER): Payer: Medicare Other | Admitting: Family Medicine

## 2016-08-05 VITALS — BP 110/68 | HR 86 | Temp 98.3°F | Resp 18 | Ht 63.0 in | Wt 172.0 lb

## 2016-08-05 DIAGNOSIS — T7840XA Allergy, unspecified, initial encounter: Secondary | ICD-10-CM | POA: Diagnosis not present

## 2016-08-05 DIAGNOSIS — T63441A Toxic effect of venom of bees, accidental (unintentional), initial encounter: Secondary | ICD-10-CM

## 2016-08-05 DIAGNOSIS — Z23 Encounter for immunization: Secondary | ICD-10-CM

## 2016-08-05 MED ORDER — METHYLPREDNISOLONE ACETATE 80 MG/ML IJ SUSP
80.0000 mg | Freq: Once | INTRAMUSCULAR | Status: AC
  Administered 2016-08-05: 80 mg via INTRAMUSCULAR

## 2016-08-05 MED ORDER — PREDNISONE 10 MG (21) PO TBPK: ORAL_TABLET | ORAL | 0 refills | Status: DC

## 2016-08-05 NOTE — Progress Notes (Signed)
Emily Bentley is a 81 y.o. female who presents to [facility] today for bee sting:  1.  Bee sting:  The patient was working in her yard 2 days ago. She poured flowers from her garden and N/V became rather guarded and stung her on her hand on her right thumb. She had pretty immediate swelling. The swelling extended to just past her wrist. It is present on her thumb and the first 2 fingers of her hand. She states the swelling has gradually been improving since then. She has been using over-the-counter hydrocortisone but it has continued itching. She's had no fevers or chills. No nausea.  ROS as above.    PMH reviewed. Patient is a nonsmoker.   Past Medical History:  Diagnosis Date  . Allergy   . Arthritis   . Asthma   . Cataract   . Chronic allergic rhinitis    with asthmatic component  . Diverticulosis of colon (without mention of hemorrhage)   . GERD (gastroesophageal reflux disease)   . Hearing impairment   . Hiatal hernia   . History of bursitis    right shoulder  . History of colon polyps   . History of pneumonia   . History of PSVT (paroxysmal supraventricular tachycardia)   . Hormone replacement therapy (postmenopausal)   . IBS (irritable bowel syndrome)   . Peptic stricture of esophagus   . Watermelon stomach    Past Surgical History:  Procedure Laterality Date  . ABDOMINAL HYSTERECTOMY    . APPENDECTOMY    . CHOLECYSTECTOMY    . COCHLEAR IMPLANT Left Oct '14   Done [facility] - great results  . EYE SURGERY    . POLYPECTOMY    . TUBAL LIGATION    . urethral dilatations    . VENTRICULAR ABLATION SURGERY      Medications reviewed. Current Outpatient Prescriptions  Medication Sig Dispense Refill  . acetaminophen (TYLENOL) 650 MG CR tablet Take 650 mg by mouth every 8 (eight) hours as needed for pain.    Marland Kitchen ALPRAZolam (XANAX) 0.5 MG tablet Take 1 tablet (0.5 mg total) by mouth at bedtime as needed for anxiety. 30 tablet 0  .  calcium-vitamin D (OSCAL WITH D) 250-125 MG-UNIT tablet Take 1 tablet by mouth daily.    . celecoxib (CELEBREX) 200 MG capsule Take 1 capsule (200 mg total) by mouth 2 (two) times daily. 60 capsule 5  . cetirizine (ZYRTEC) 5 MG tablet Take 5 mg by mouth daily.    . cholecalciferol (VITAMIN D) 1000 units tablet Take 2,000 Units by mouth daily.    Marland Kitchen esomeprazole (NEXIUM) 20 MG capsule Take 20 mg by mouth daily at 12 noon.    . Magnesium Hydroxide (MILK OF MAGNESIA PO) Take by mouth as directed.      . Melatonin 3 MG TABS Take 1 tablet (3 mg total) by mouth at bedtime as needed (sleep). 30 tablet 6  . methocarbamol (ROBAXIN) 750 MG tablet Take 750 mg by mouth 4 (four) times daily. Reported on 05/04/2016    . solifenacin (VESICARE) 5 MG tablet Take 1 tablet (5 mg total) by mouth daily. 30 tablet 3  . traMADol (ULTRAM) 50 MG tablet Take 0.5-1 tablets (25-50 mg total) by mouth 2 (two) times daily. 60 tablet 1  . TURMERIC PO Take by mouth.     No current facility-administered medications for this visit.      Physical Exam:  BP 110/68 (BP Location: Right  Arm, Patient Position: Sitting, Cuff Size: Small)   Pulse 86   Temp 98.3 F (36.8 C) (Oral)   Resp 18   Ht 5\' 3"  (1.6 m)   Wt 172 lb (78 kg)   SpO2 98%   BMI 30.47 kg/m  Gen:  Alert, cooperative patient who appears stated age in no acute distress.  Vital signs reviewed. HEENT:  PERRL, EOMI.  normal airway. No lip or tongue swelling. Pulm:  Clear to auscultation bilaterally with good air movement.  No wheezes or rales noted.   Cardiac:  Regular rate and rhythm without murmur auscultated.  Good S1/S2. Skin:  Patient has some mild erythema and edema along the dorsum of her right hand. Extends from base of right thumb. She has the evidence of bee sting in the webbing of her right hand. Swelling extends to the PIP joint of both the second and third digit. Extends just distal to her wrist. She has full sensation and neurovascular intactness. No  warmth. No evidence of cellulitis. No lymphangitis.  Assessment and Plan:  1.  Allergic reaction to bee sting: - Depo-Medrol here in clinic. -Treating with prednisone taper. -Continue cetirizine. She is only on 5 mg and we can increase this to 10 for the next several days. -She should only use the Benadryl when necessary. Try to limit anticholinergics. -No evidence of cellulitis. Warning precautions provided.

## 2016-08-05 NOTE — Patient Instructions (Addendum)
It was good to meet you today!  You have an allergic reaction to the bee sting.  For this, take the prednisone in a decreasing taper: Starting tomorrow take 6 pills, then 5 pills today after, 4 pills today after, 3 pills after, 2 pills after, 1 pill last day.  Steroid shot here in clinic.  Take 2 of your Zyrtec for the next 6 days. After that you can drop back down to 1.  You can take to the Benadryl at night. However if you not have itching or trouble sleeping then you do not have to take this.  If he starts noticing worsening of the redness or itching despite the medicine or it's not better in the next week come back and see [place].     IF you received an x-ray today, you will receive an invoice from [facility]. Please contact [facility] at [phone] with questions or concerns regarding your invoice.   IF you received labwork today, you will receive an invoice from [employer]. Please contact [facility] at [phone] with questions or concerns regarding your invoice.   Our billing staff will not be able to assist you with questions regarding bills from these companies.  You will be contacted with the lab results as soon as they are available. The fastest way to get your results is to activate your My Chart account. Instructions are located on the last page of this paperwork. If you have not heard from [place] regarding the results in 2 weeks, please contact this office.

## 2016-08-13 ENCOUNTER — Ambulatory Visit (INDEPENDENT_AMBULATORY_CARE_PROVIDER_SITE_OTHER): Payer: Medicare Other | Admitting: Physician Assistant

## 2016-08-13 ENCOUNTER — Ambulatory Visit (INDEPENDENT_AMBULATORY_CARE_PROVIDER_SITE_OTHER): Payer: Medicare Other

## 2016-08-13 VITALS — BP 120/74 | HR 71 | Temp 98.3°F | Resp 18 | Ht 62.0 in | Wt 172.4 lb

## 2016-08-13 DIAGNOSIS — R1310 Dysphagia, unspecified: Secondary | ICD-10-CM

## 2016-08-13 DIAGNOSIS — R221 Localized swelling, mass and lump, neck: Secondary | ICD-10-CM

## 2016-08-13 DIAGNOSIS — R5383 Other fatigue: Secondary | ICD-10-CM

## 2016-08-13 LAB — BASIC METABOLIC PANEL WITH GFR
CO2: 25 mmol/L (ref 20–31)
Calcium: 9 mg/dL (ref 8.6–10.4)
Glucose, Bld: 89 mg/dL (ref 65–99)
Potassium: 4.4 mmol/L (ref 3.5–5.3)
Sodium: 140 mmol/L (ref 135–146)

## 2016-08-13 LAB — BASIC METABOLIC PANEL
BUN: 21 mg/dL (ref 7–25)
Chloride: 105 mmol/L (ref 98–110)
Creat: 1.16 mg/dL — ABNORMAL HIGH (ref 0.60–0.93)

## 2016-08-13 LAB — TSH: TSH: 1.99 m[IU]/L

## 2016-08-13 NOTE — Patient Instructions (Addendum)
We will get soft tissue imaging of your neck.  I will call you with the results of your TSH and neck imaging. We will make a plan according to what the results indicate. Possible referral to ENT or Endocrinology. You will hear back early next week.  Please keep your November appointment with your GI doctor.   Thank you for coming in today. I hope you feel we met your needs.  Feel free to call [facility] if you have any questions or further requests.  Please consider signing up for MyChart if you do not already have it, as this is a great way to communicate with me.  Best,  Whitney McVey, PA-C  IF you received an x-ray today, you will receive an invoice from [facility]. Please contact [facility] at [phone] with questions or concerns regarding your invoice.   IF you received labwork today, you will receive an invoice from [employer]. Please contact [facility] at [phone] with questions or concerns regarding your invoice.   Our billing staff will not be able to assist you with questions regarding bills from these companies.  You will be contacted with the lab results as soon as they are available. The fastest way to get your results is to activate your My Chart account. Instructions are located on the last page of this paperwork. If you have not heard from [place] regarding the results in 2 weeks, please contact this office.

## 2016-08-13 NOTE — Progress Notes (Signed)
Emily Bentley  MRN: [id number] DOB: 10-Mar-1935  PCP: [name], MD  Subjective:  Pt is a 81 year old female, history of arthritis and GERD, who presents to clinic for swollen neck.  Yesterday she felt "hot flashes", "got to where my equilibrium was off" and SOB. Took Zyrtec and used her inhaler and she felt better, no SOB.  Left side of her neck swelled up last night described it as "burning" but not painful. Took two Tylenol. This morning she noticed the front of neck was swollen as well. Front of her neck is not painful, no "burning".   Difficulty swallowing - Feels like when she swallows something is there. When she eats she has to drink water to help it go down. This has been going on for several months. History of GERD - GI doctor appointment scheduled for Nov. 7 - Has had several several procedures to stretching esophagus.  last procedure 3 years ago. Takes Nexium. Protonix isn't covered by insurance.    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 Negative for activity change, appetite change, chills and unexpected weight change.  HENT: Positive for trouble swallowing.   Respiratory: Positive for shortness of breath. Negative for cough, chest tightness and wheezing.   Cardiovascular: Negative.   Gastrointestinal: Positive for nausea and vomiting.  Endocrine: Negative.   Musculoskeletal: Positive for neck pain and neck stiffness.  Neurological: Negative for syncope and weakness.    Patient Active Problem List   Diagnosis Date Noted  . Suprapubic pain 05/05/2016  . Degenerative disc disease, lumbar 09/16/2015  . Numbness in feet 03/25/2015  . Routine health maintenance 07/02/2013  . OAB (overactive bladder) 05/25/2012  . OSTEOPENIA 10/09/2009  . Cochlear implant in place  12/30/2007  . PSVT 12/30/2007  . GERD 12/30/2007    Current Outpatient Prescriptions on File Prior to Visit  Medication Sig Dispense Refill  . acetaminophen (TYLENOL) 650 MG CR tablet Take 650 mg by mouth every 8 (eight) hours as needed for pain.    Marland Kitchen ALPRAZolam (XANAX) 0.5 MG tablet Take 1 tablet (0.5 mg total) by mouth at bedtime as needed for anxiety. 30 tablet 0  . calcium-vitamin D (OSCAL WITH D) 250-125 MG-UNIT tablet Take 1 tablet by mouth daily.    . celecoxib (CELEBREX) 200 MG capsule Take 1 capsule (200 mg total) by mouth 2 (two) times daily. 60 capsule 5  . cetirizine (ZYRTEC) 5 MG tablet Take 5 mg by mouth daily.    . cholecalciferol (VITAMIN D) 1000 units tablet Take 2,000 Units by mouth daily.    Marland Kitchen esomeprazole (NEXIUM) 20 MG capsule Take 20 mg by mouth daily at 12 noon.    . solifenacin (VESICARE) 5 MG tablet Take 1 tablet (5 mg total) by mouth daily. 30 tablet 3  . traMADol (ULTRAM) 50 MG tablet Take 0.5-1 tablets (25-50 mg total) by mouth 2 (two) times daily. 60 tablet 1  . Melatonin 3 MG TABS Take 1 tablet (3 mg total) by mouth at bedtime as needed (sleep). (Patient not taking: Reported on 08/13/2016) 30 tablet 6  . methocarbamol (ROBAXIN) 750 MG tablet Take 750 mg by mouth 4 (four) times daily. Reported on 05/04/2016     No current facility-administered medications on file prior to visit.     Allergies  Allergen Reactions  . Sulfamethoxazole-Trimethoprim     Objective:  BP 120/74 (BP Location: Left Arm, Patient Position: Sitting, Cuff Size: Large)   Pulse 71   Temp 98.3 F (36.8 C) (Oral)   Resp 18   Ht 5\' 2"  (1.575 m)   Wt 172 lb 6.4 oz (78.2 kg)   SpO2 98%   BMI 31.53 kg/m   Physical Exam  Constitutional: She is oriented to person, place, and time and well-developed, well-nourished, and in no distress. No distress.  Neck: Trachea normal, normal range of motion and full passive range of motion without pain. Neck supple. No tracheal tenderness present. Edema  present. No tracheal deviation present. Thyromegaly present. No thyroid mass present.    Soft tissue edema left lateral neck. Goiter appreciated.   Cardiovascular: Normal rate, regular rhythm and normal heart sounds.   Neurological: She is alert and oriented to person, place, and time. GCS score is 15.  Skin: Skin is warm and dry.  Psychiatric: Mood, memory, affect and judgment normal.  Vitals reviewed.   Dg Neck Soft Tissue  Result Date: 08/13/2016 CLINICAL DATA:  81 year old female with a history of difficulty swallowing EXAM: NECK SOFT TISSUES - 1+ VIEW COMPARISON:  None. FINDINGS: There is no evidence of retropharyngeal soft tissue swelling or epiglottic enlargement. The cervical airway is unremarkable and no unexpected radio-opaque foreign body identified. Mild degenerative changes of the cervical spine. Cartilaginous calcifications. Auditory device, either a traditional hearing aid or potentially a cochlear implant, although the course of electronic lead is poorly visualized. IMPRESSION: Negative for acute soft tissue abnormality. Signed, Dulcy Fanny. Earleen Newport, DO [facility] Electronically Signed   By: Corrie Mckusick D.O.   On: 08/13/2016 09:55   Assessment and Plan :  1. Neck swelling 2. Dysphagia, unspecified type 3. Fatigue, unspecified type - DG Neck Soft Tissue; Future -- Concern for possible malignancy of neck due to her family history, however her soft tissue XR is negative.  - TSH, BMP --  Results pending, will consider referral to endocrinology  - History of esophageal strictures -- Encouraged patient to keep appointment with her GI doctor in November to assess dysphagia.  - Patient instructed to call or RTC if her symptoms worsen. She understands and agrees with plan.    Mercer Pod, PA-C  [facility] 08/13/2016 8:53 AM

## 2016-09-10 IMAGING — CR DG LUMBAR SPINE COMPLETE 4+V
5 series · 5 of 5 positions shown · non-contrast
Comparison: CT abdomen pelvis of 11/09/2011

CLINICAL DATA: Left-sided low back pain for 2 days, no injury

EXAM:
LUMBAR SPINE - COMPLETE 4+ VIEW

[AP (1 of 2)]
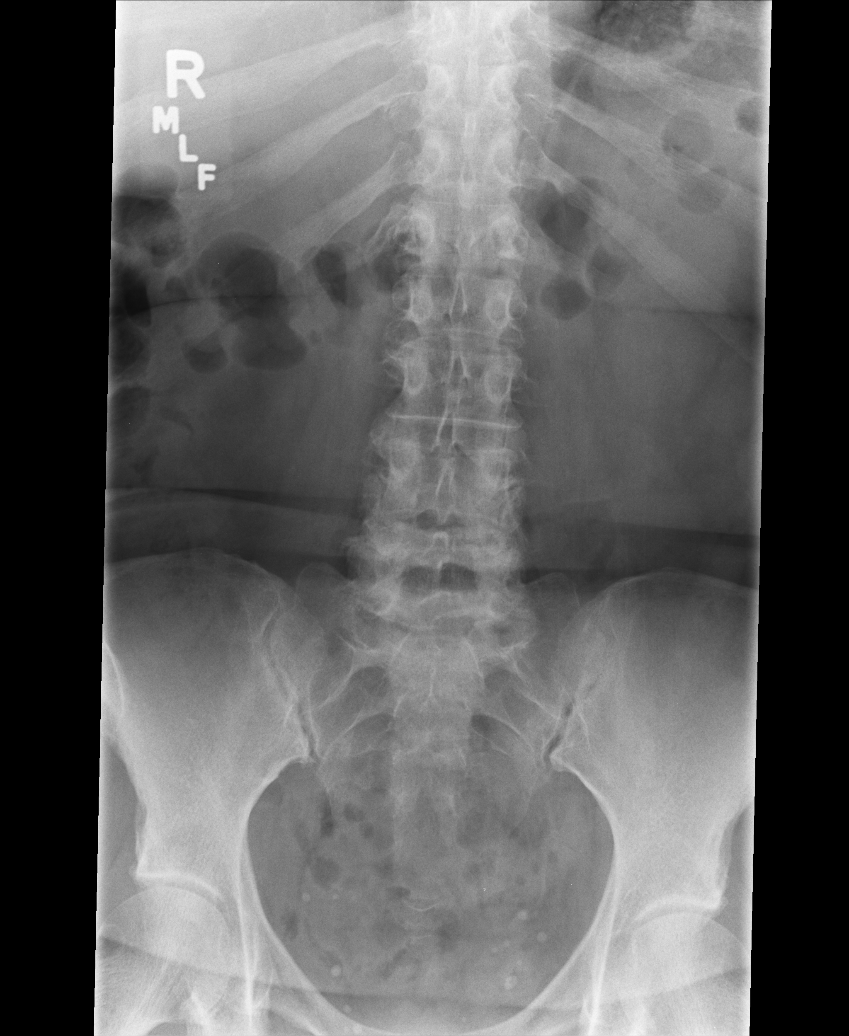

[rpo]
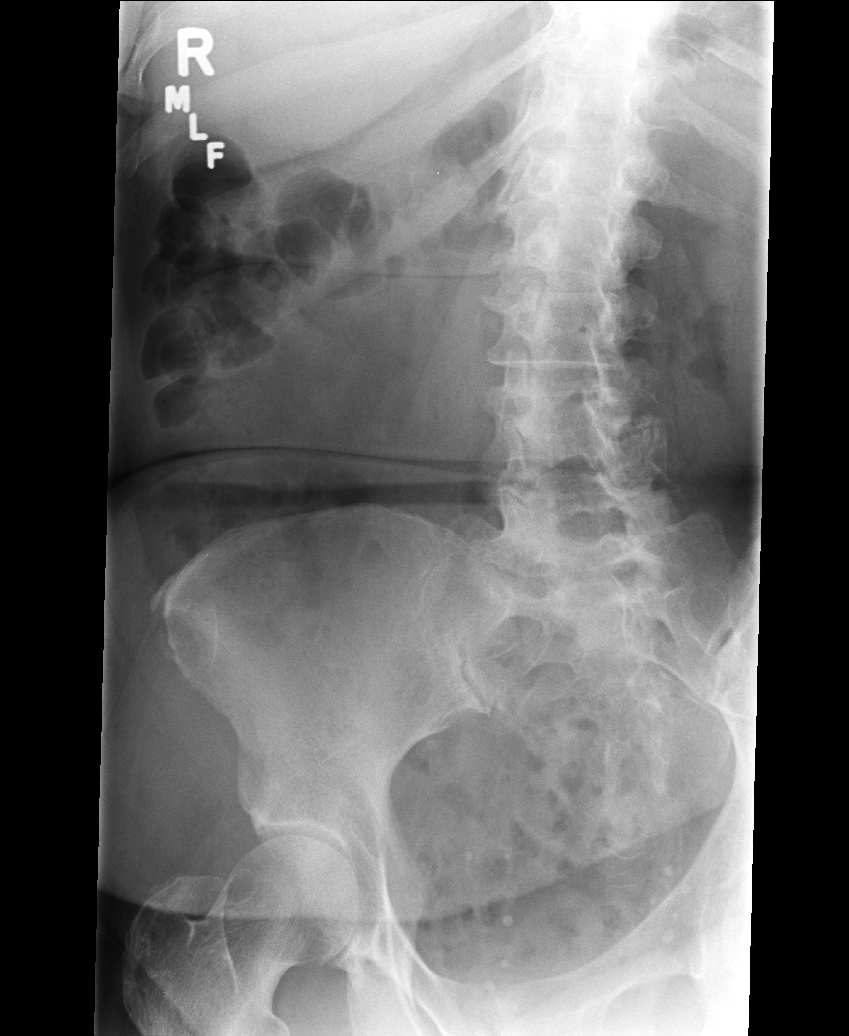

[lpo]
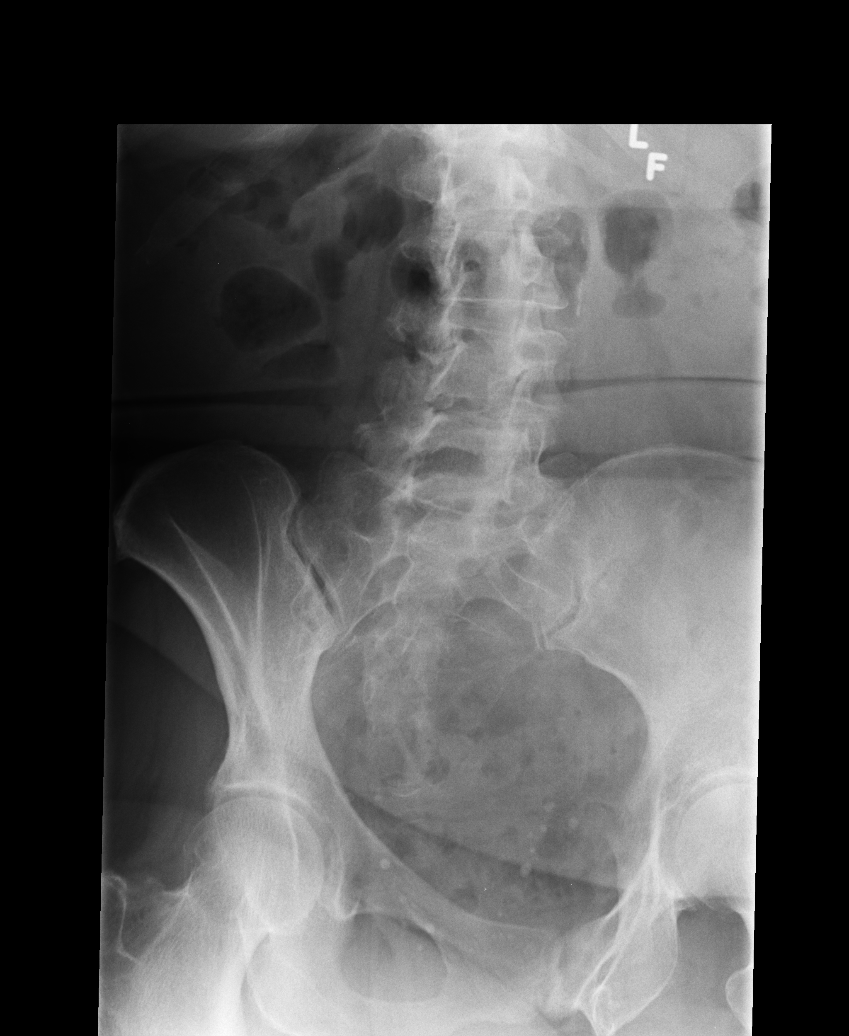

[AP (2 of 2)]
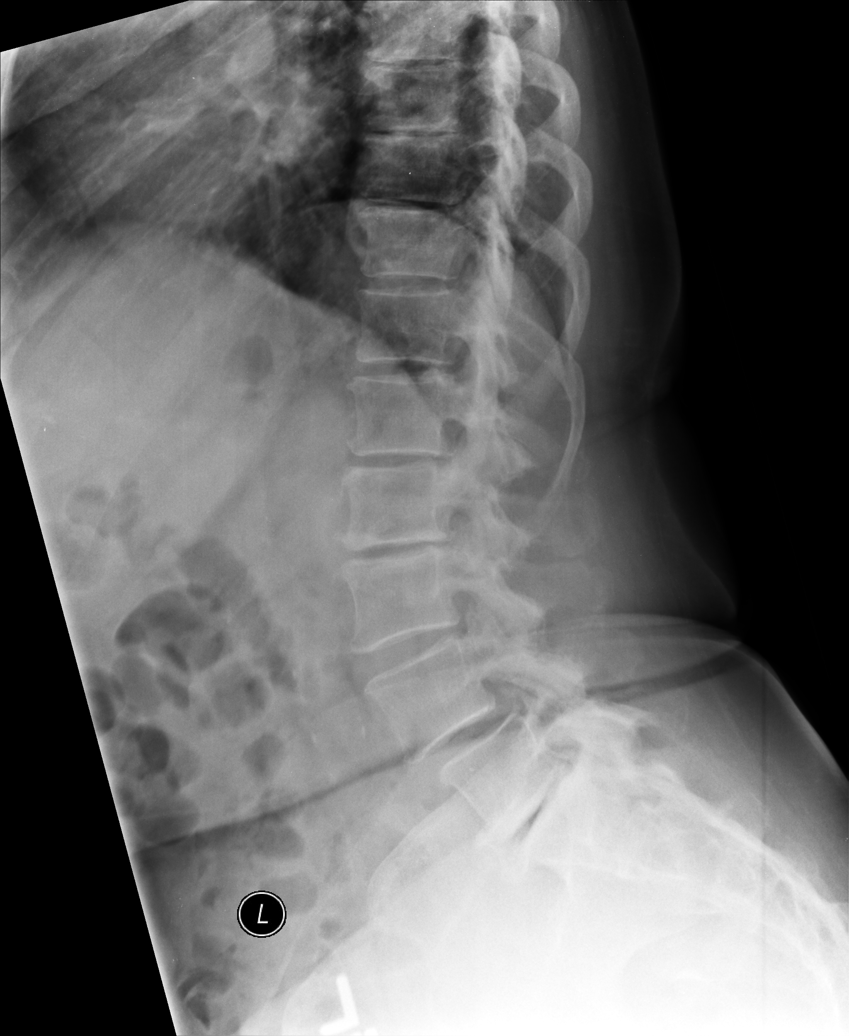

[l5 s1]
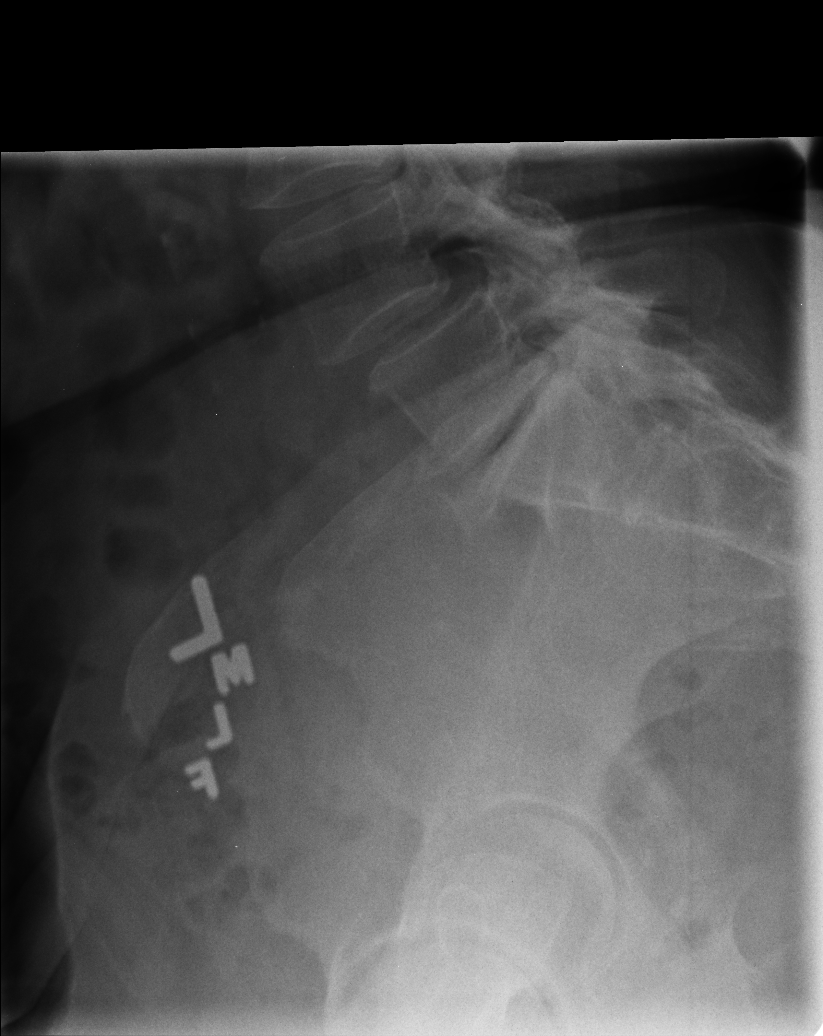

[5 of 5 positions shown; findings below may reference images not displayed]

FINDINGS: There is a approximately 4 mm anterolisthesis of L4 on L5 most
likely due to degenerative change involving the facet joints of the
lower lumbar spine. There is degenerative disc disease most marked
at L5-S1 but also present at L2-3 with loss of disc space and
sclerosis with spurring. Vacuum disc phenomenon is noted at L5-S1.
No compression deformity is seen. There is degenerative change
involving the facet joints particularly of L4-5 and L5-S1. The SI
joints are corticated.
IMPRESSION: 1. Degenerative disc disease at L5-S1 and L2-3.
2. 4 mm anterolisthesis of L4 on L5 due to degenerative change
involving the facet joints.

## 2016-09-15 ENCOUNTER — Ambulatory Visit (INDEPENDENT_AMBULATORY_CARE_PROVIDER_SITE_OTHER): Payer: Medicare Other | Admitting: Internal Medicine

## 2016-09-15 ENCOUNTER — Encounter: Payer: Self-pay | Admitting: Internal Medicine

## 2016-09-15 ENCOUNTER — Encounter (INDEPENDENT_AMBULATORY_CARE_PROVIDER_SITE_OTHER): Payer: Self-pay

## 2016-09-15 VITALS — BP 120/66 | HR 72 | Ht 62.0 in | Wt 170.0 lb

## 2016-09-15 DIAGNOSIS — K219 Gastro-esophageal reflux disease without esophagitis: Secondary | ICD-10-CM | POA: Diagnosis not present

## 2016-09-15 DIAGNOSIS — K222 Esophageal obstruction: Secondary | ICD-10-CM | POA: Diagnosis not present

## 2016-09-15 DIAGNOSIS — R1319 Other dysphagia: Secondary | ICD-10-CM

## 2016-09-15 NOTE — Progress Notes (Signed)
HISTORY OF PRESENT ILLNESS:  Emily Bentley is a 81 y.o. female with multiple significant medical problems as listed below. She was last evaluated in the office September 2014 for GERD with dysphagia and surveillance colonoscopy. See that dictation. In October 2014 she underwent surveillance colonoscopy. She was found to have diverticulosis. Follow-up in 10 years recommended. Upper endoscopy refilled a distal stricture which was dilated with 54 French Maloney dilator. Patient was continued on PPI therapy. She reports that she did well without reflux symptoms or swallowing issues until proximally 6 or 7 months ago when she began to notice recurrent intermittent solid food dysphagia. This has slightly worsened. Similar to before. No additional problems or complaints. Her issues with constipation and bloating are unchanged and manageable.  REVIEW OF SYSTEMS:  All non-GI ROS negative except for sinus allergy, muscle cramps  Past Medical History:  Diagnosis Date  . Allergy   . Arthritis   . Asthma   . Cataract   . Chronic allergic rhinitis    with asthmatic component  . Diverticulosis of colon (without mention of hemorrhage)   . GERD (gastroesophageal reflux disease)   . Hearing impairment   . Hiatal hernia   . History of bursitis    right shoulder  . History of colon polyps   . History of pneumonia   . History of PSVT (paroxysmal supraventricular tachycardia)   . Hormone replacement therapy (postmenopausal)   . IBS (irritable bowel syndrome)   . Peptic stricture of esophagus   . Watermelon stomach     Past Surgical History:  Procedure Laterality Date  . ABDOMINAL HYSTERECTOMY    . APPENDECTOMY    . CHOLECYSTECTOMY    . COCHLEAR IMPLANT Left Oct '14   Done at [facility] - great results  . EYE SURGERY    . POLYPECTOMY    . TUBAL LIGATION    . urethral dilatations    . VENTRICULAR ABLATION SURGERY      Social History Emily Bentley  reports that she has never smoked.  She has never used smokeless tobacco. She reports that she does not drink alcohol or use drugs.  family history includes Coronary artery disease in her father; Diabetes in her mother; Hypertension in her father; Uterine cancer in her mother.  Allergies  Allergen Reactions  . Sulfamethoxazole-Trimethoprim        PHYSICAL EXAMINATION: Vital signs: BP 120/66   Pulse 72   Ht 5\' 2"  (1.575 m)   Wt 170 lb (77.1 kg)   BMI 31.09 kg/m   Constitutional: generally well-appearing, no acute distress Psychiatric: alert and oriented x3, cooperative Eyes: extraocular movements intact, anicteric, conjunctiva pink Mouth: oral pharynx moist, no lesions Neck: supple no lymphadenopathy Cardiovascular: heart regular rate and rhythm, no murmur Lungs: clear to auscultation bilaterally Abdomen: soft, nontender, nondistended, no obvious ascites, no peritoneal signs, normal bowel sounds, no organomegaly Rectal: Omitted Extremities: no clubbing cyanosis or lower extremity edema bilaterally Skin: no lesions on visible extremities Neuro: No focal deficits. Cranial nerves intact. Hearing impairment  ASSESSMENT:  #1. GERD, complicated by peptic stricture #2. Recurrent dysphagia likely secondary to peptic stricture #3. History of colon polyps. Surveillance up-to-date #4. Gen. medical problems  PLAN:  #1. Reflux precautions #2. Continue PPI. We will prescribe when needed #3. Schedule upper endoscopy with esophageal dilation .The nature of the procedure, as well as the risks, benefits, and alternatives were carefully and thoroughly reviewed with the patient. Ample time for discussion and questions allowed. The patient understood,  was satisfied, and agreed to proceed. #4. Aged out of colonoscopy surveillance

## 2016-09-15 NOTE — Patient Instructions (Signed)

## 2016-09-29 ENCOUNTER — Encounter: Payer: Self-pay | Admitting: Internal Medicine

## 2016-09-29 ENCOUNTER — Ambulatory Visit (AMBULATORY_SURGERY_CENTER): Payer: Medicare Other | Admitting: Internal Medicine

## 2016-09-29 VITALS — BP 135/76 | HR 56 | Temp 97.3°F | Resp 17 | Ht 62.0 in | Wt 170.0 lb

## 2016-09-29 DIAGNOSIS — R1319 Other dysphagia: Secondary | ICD-10-CM | POA: Diagnosis not present

## 2016-09-29 DIAGNOSIS — K222 Esophageal obstruction: Secondary | ICD-10-CM

## 2016-09-29 DIAGNOSIS — K219 Gastro-esophageal reflux disease without esophagitis: Secondary | ICD-10-CM | POA: Diagnosis not present

## 2016-09-29 MED ORDER — SODIUM CHLORIDE 0.9 % IV SOLN: 500.0000 mL | INTRAVENOUS | Status: DC

## 2016-09-29 NOTE — Patient Instructions (Signed)
YOU HAD AN ENDOSCOPIC PROCEDURE TODAY AT [facility]:   Refer to the procedure report that was given to you for any specific questions about what was found during the examination.  If the procedure report does not answer your questions, please call your gastroenterologist to clarify.  If you requested that your care partner not be given the details of your procedure findings, then the procedure report has been included in a sealed envelope for you to review at your convenience later.  YOU SHOULD EXPECT: Some feelings of bloating in the abdomen. Passage of more gas than usual.  Walking can help get rid of the air that was put into your GI tract during the procedure and reduce the bloating. If you had a lower endoscopy (such as a colonoscopy or flexible sigmoidoscopy) you may notice spotting of blood in your stool or on the toilet paper. If you underwent a bowel prep for your procedure, you may not have a normal bowel movement for a few days.  Please Note:  You might notice some irritation and congestion in your nose or some drainage.  This is from the oxygen used during your procedure.  There is no need for concern and it should clear up in a day or so.  SYMPTOMS TO REPORT IMMEDIATELY:   Following lower endoscopy (colonoscopy or flexible sigmoidoscopy):  Excessive amounts of blood in the stool  Significant tenderness or worsening of abdominal pains  Swelling of the abdomen that is new, acute  Fever of 100F or higher   Following upper endoscopy (EGD)  Vomiting of blood or coffee ground material  New chest pain or pain under the shoulder blades  Painful or persistently difficult swallowing  New shortness of breath  Fever of 100F or higher  Black, tarry-looking stools  For urgent or emergent issues, a gastroenterologist can be reached at any hour by calling [phone].   DIET:  Please follow the dilatation diet the rest of the day.  Handout was provided.  Drink plenty of  fluids but you should avoid alcoholic beverages for 24 hours.  ACTIVITY:  You should plan to take it easy for the rest of today and you should NOT DRIVE or use heavy machinery until tomorrow (because of the sedation medicines used during the test).    FOLLOW UP: Our staff will call the number listed on your records the next business day following your procedure to check on you and address any questions or concerns that you may have regarding the information given to you following your procedure. If we do not reach you, we will leave a message.  However, if you are feeling well and you are not experiencing any problems, there is no need to return our call.  We will assume that you have returned to your regular daily activities without incident.  If any biopsies were taken you will be contacted by phone or by letter within the next 1-3 weeks.  Please call us at [phone] if you have not heard about the biopsies in 3 weeks.    SIGNATURES/CONFIDENTIALITY: You and/or your care partner have signed paperwork which will be entered into your electronic medical record.  These signatures attest to the fact that that the information above on your After Visit Summary has been reviewed and is understood.  Full responsibility of the confidentiality of this discharge information lies with you and/or your care-partner.   Handouts were given to your care partner on a Hiatal Hernia and the  dilatation diet to follow the rest of today. You may resume your current medications today. Please call if any questions or concerns.

## 2016-09-29 NOTE — Progress Notes (Signed)
Called to room to assist during endoscopic procedure.  Patient ID and intended procedure confirmed with present staff. Received instructions for my participation in the procedure from the performing physician.  

## 2016-09-29 NOTE — Op Note (Signed)
[place] Patient Name: Emily Bentley Procedure Date: 09/29/2016 9:39 AM MRN: [id number] Endoscopist: Docia Chuck. Henrene Pastor , MD Age: 81 Referring MD:  Date of Birth: 09-17-1935 Gender: Female Account #: [id number] Procedure:                Upper GI endoscopy with [facility] dilation 17 F Indications:              Dysphagia Medicines:                Monitored Anesthesia Care Procedure:                Pre-Anesthesia Assessment:                           - Prior to the procedure, a History and Physical                            was performed, and patient medications and                            allergies were reviewed. The patient's tolerance of                            previous anesthesia was also reviewed. The risks                            and benefits of the procedure and the sedation                            options and risks were discussed with the patient.                            All questions were answered, and informed consent                            was obtained. Prior Anticoagulants: The patient has                            taken no previous anticoagulant or antiplatelet                            agents. ASA Grade Assessment: II - A patient with                            mild systemic disease. After reviewing the risks                            and benefits, the patient was deemed in                            satisfactory condition to undergo the procedure.                           After obtaining informed consent, the endoscope was  passed under direct vision. Throughout the                            procedure, the patient's blood pressure, pulse, and                            oxygen saturations were monitored continuously. The                            Model GIF-HQ190 [id number]) scope was introduced                            through the mouth, and advanced to the second part                            of duodenum. The  upper GI endoscopy was                            accomplished without difficulty. The patient                            tolerated the procedure well. Scope In: Scope Out: Findings:                 One moderate benign-appearing, intrinsic stenosis                            was found 35 cm from the incisors. This measured                            1.5 cm (inner diameter). The scope was withdrawn.                            Dilation was performed with a Maloney dilator with                            no resistance at 89 Fr.                           The exam of the esophagus was otherwise normal.                           The stomach was normal. Hiatal hernia present.                           The examined duodenum was normal.                           The cardia and gastric fundus were normal on                            retroflexion. Complications:            No immediate complications. Estimated Blood Loss:     Estimated blood loss: none. Impression:               -  Benign-appearing esophageal stenosis. Dilated.                           - Normal stomach.                           - Normal examined duodenum.                           - No specimens collected. Recommendation:           - Patient has a contact number available for                            emergencies. The signs and symptoms of potential                            delayed complications were discussed with the                            patient. Return to normal activities tomorrow.                            Written discharge instructions were provided to the                            patient.                           - Post- dilation diet.                           - Continue present medications. Docia Chuck. Henrene Pastor, MD 09/29/2016 9:54:42 AM This report has been signed electronically.

## 2016-09-29 NOTE — Progress Notes (Signed)
No problems noted in the recovery room. maw 

## 2016-09-29 NOTE — Progress Notes (Signed)
Report given to PACU RN, vss 

## 2016-09-30 ENCOUNTER — Telehealth: Payer: Self-pay | Admitting: *Deleted

## 2016-09-30 NOTE — Telephone Encounter (Signed)
  Follow up Call-  Call back number 09/29/2016  Post procedure Call Back phone  # [phone]  Permission to leave phone message Yes  Some recent data might be hidden     Patient questions:  Do you have a fever, pain , or abdominal swelling? NO Pain Score  0 *  Have you tolerated food without any problems? Yes.    Have you been able to return to your normal activities? Yes.    Do you have any questions about your discharge instructions: Diet   No. Medications  No. Follow up visit  No.  Do you have questions or concerns about your Care? No.  Actions: * If pain score is 4 or above: No action needed, pain <4.

## 2016-10-30 ENCOUNTER — Ambulatory Visit (INDEPENDENT_AMBULATORY_CARE_PROVIDER_SITE_OTHER): Payer: Medicare Other | Admitting: Emergency Medicine

## 2016-10-30 VITALS — BP 122/72 | HR 85 | Temp 98.7°F | Resp 17 | Ht 63.5 in | Wt 169.0 lb

## 2016-10-30 DIAGNOSIS — J069 Acute upper respiratory infection, unspecified: Secondary | ICD-10-CM | POA: Diagnosis not present

## 2016-10-30 DIAGNOSIS — J209 Acute bronchitis, unspecified: Secondary | ICD-10-CM

## 2016-10-30 MED ORDER — AZITHROMYCIN 250 MG PO TABS: ORAL_TABLET | ORAL | 0 refills | Status: DC

## 2016-10-30 NOTE — Progress Notes (Signed)
Emily Bentley 81 y.o.  URI   This is a new problem. The current episode started 1 to 4 weeks ago. The problem has been gradually worsening. There has been no fever. Associated symptoms include congestion, coughing, rhinorrhea and sinus pain. Pertinent negatives include no abdominal pain, chest pain, diarrhea, dysuria, ear pain, headaches, joint pain, nausea, rash, sore throat, swollen glands, vomiting or wheezing. She has tried acetaminophen for the symptoms. The treatment provided no relief.    Chief Complaint  Patient presents with  . Cough  . URI    HISTORY OF PRESENT ILLNESS: This is a 81 y.o. female complaining of flu-like symptoms..  Prior to Admission medications   Medication Sig Start Date End Date Taking? Authorizing Provider  acetaminophen (TYLENOL) 650 MG CR tablet Take 650 mg by mouth every 8 (eight) hours as needed for pain.   Yes Historical Provider, MD  ALPRAZolam Duanne Moron) 0.5 MG tablet Take 1 tablet (0.5 mg total) by mouth at bedtime as needed for anxiety. 05/04/16  Yes [name], MD  calcium-vitamin D (OSCAL WITH D) 250-125 MG-UNIT tablet Take 1 tablet by mouth daily.   Yes Historical Provider, MD  celecoxib (CELEBREX) 200 MG capsule Take 1 capsule (200 mg total) by mouth 2 (two) times daily. 05/04/16  Yes [name], MD  cetirizine (ZYRTEC) 5 MG tablet Take 5 mg by mouth daily.   Yes Historical Provider, MD  cholecalciferol (VITAMIN D) 1000 units tablet Take 2,000 Units by mouth daily.   Yes Historical Provider, MD  esomeprazole (NEXIUM) 20 MG capsule Take 20 mg by mouth daily at 12 noon.   Yes Historical Provider, MD  solifenacin (VESICARE) 5 MG tablet Take 1 tablet (5 mg total) by mouth daily. 08/07/14  Yes [name], MD  traMADol (ULTRAM) 50 MG tablet Take 0.5-1 tablets (25-50 mg total) by mouth 2 (two) times daily. 05/04/16  Yes [name], MD    Allergies  Allergen Reactions  . Sulfamethoxazole-Trimethoprim     Patient  Active Problem List   Diagnosis Date Noted  . Suprapubic pain 05/05/2016  . Degenerative disc disease, lumbar 09/16/2015  . OAB (overactive bladder) 05/25/2012  . OSTEOPENIA 10/09/2009  . Cochlear implant in place 12/30/2007  . PSVT 12/30/2007  . GERD 12/30/2007    Past Medical History:  Diagnosis Date  . Allergy   . Arthritis   . Asthma   . Cataract   . Chronic allergic rhinitis    with asthmatic component  . Diverticulosis of colon (without mention of hemorrhage)   . GERD (gastroesophageal reflux disease)   . Hearing impairment   . Hiatal hernia   . History of bursitis    right shoulder  . History of colon polyps   . History of pneumonia   . History of PSVT (paroxysmal supraventricular tachycardia)   . Hormone replacement therapy (postmenopausal)   . IBS (irritable bowel syndrome)   . Peptic stricture of esophagus   . Watermelon stomach     Past Surgical History:  Procedure Laterality Date  . ABDOMINAL HYSTERECTOMY    . APPENDECTOMY    . CHOLECYSTECTOMY    . COCHLEAR IMPLANT Left Oct '14   Done at [facility] - great results  . EYE SURGERY    . POLYPECTOMY    . TUBAL LIGATION    . urethral dilatations    . VENTRICULAR ABLATION SURGERY      Social History   Social History  . Marital status: Widowed  Spouse name: N/A  . Number of children: 4  . Years of education: N/A   Occupational History  .  [employer]   Social History Main Topics  . Smoking status: Never Smoker  . Smokeless tobacco: Never Used  . Alcohol use No  . Drug use: No  . Sexual activity: No   Other Topics Concern  . 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 Diabetes Mother   . Uterine cancer Mother   . Coronary artery  disease Father     CABG x 5 in 1985  . Hypertension Father   . Colon cancer Neg Hx      Review of Systems  Constitutional: Negative.   HENT: Positive for congestion, rhinorrhea and sinus pain. Negative for ear pain and sore throat.   Eyes: Negative.   Respiratory: Positive for cough and sputum production. Negative for hemoptysis, shortness of breath and wheezing.   Cardiovascular: Negative.  Negative for chest pain, palpitations, leg swelling and PND.  Gastrointestinal: Negative.  Negative for abdominal pain, diarrhea, nausea and vomiting.  Genitourinary: Negative.  Negative for dysuria.  Musculoskeletal: Negative.  Negative for joint pain.  Skin: Negative.  Negative for rash.  Neurological: Negative.  Negative for headaches.  Endo/Heme/Allergies: Negative.      Physical Exam  Constitutional: She is oriented to person, place, and time. She appears well-developed and well-nourished.  HENT:  Head: Normocephalic and atraumatic.  Eyes: Conjunctivae and EOM are normal. Pupils are equal, round, and reactive to light.  Neck: Normal range of motion. Neck supple.  Cardiovascular: Normal rate, regular rhythm, normal heart sounds and intact distal pulses.   Pulmonary/Chest: Effort normal and breath sounds normal. No respiratory distress. She has no wheezes. She has no rales.  Abdominal: Soft. Bowel sounds are normal.  Musculoskeletal: Normal range of motion.  Neurological: She is alert and oriented to person, place, and time.  Skin: Skin is warm and dry.  Vitals reviewed. Blood pressure 122/72, pulse 85, temperature 98.7 F (37.1 C), temperature source Oral, resp. rate 17, height 5' 3.5" (1.613 m), weight 169 lb (76.7 kg), SpO2 97 %.  Vitals:   10/30/16 0924  Weight: 169 lb (76.7 kg)  Height: 5' 3.5" (1.613 m)      ASSESSMENT & PLAN: Emily Bentley was seen today for cough and uri.  Diagnoses and all orders for this visit:  Acute bronchitis, unspecified organism -     Care  order/instruction:  Upper respiratory tract infection, unspecified type -     Care order/instruction:  Other orders -     azithromycin (ZITHROMAX) 250 MG tablet; Sig as indicated      Emily Caroli, MD [facility]

## 2016-10-30 NOTE — Patient Instructions (Addendum)
     IF you received an x-ray today, you will receive an invoice from [facility]. Please contact [facility] at [phone] with questions or concerns regarding your invoice.   IF you received labwork today, you will receive an invoice from [facility]. Please contact [facility] at [phone] with questions or concerns regarding your invoice.   Our billing staff will not be able to assist you with questions regarding bills from these companies.  You will be contacted with the lab results as soon as they are available. The fastest way to get your results is to activate your My Chart account. Instructions are located on the last page of this paperwork. If you have not heard from [place] regarding the results in 2 weeks, please contact this office.     Bronchitis  Acute Bronchitis, Adult Acute bronchitis is when air tubes (bronchi) in the lungs suddenly get swollen. The condition can make it hard to breathe. It can also cause these symptoms:  A cough.  Coughing up clear, yellow, or green mucus.  Wheezing.  Chest congestion.  Shortness of breath.  A fever.  Body aches.  Chills.  A sore throat. Follow these instructions at home: Medicines  Take over-the-counter and prescription medicines only as told by your doctor.  If you were prescribed an antibiotic medicine, take it as told by your doctor. Do not stop taking the antibiotic even if you start to feel better. General instructions  Rest.  Drink enough fluids to keep your pee (urine) clear or pale yellow.  Avoid smoking and secondhand smoke. If you smoke and you need help quitting, ask your doctor. Quitting will help your lungs heal faster.  Use an inhaler, cool mist vaporizer, or humidifier as told by your doctor.  Keep all follow-up visits as told by your doctor. This is important. How is this prevented? To lower your risk of getting this condition again:  Wash your hands often with soap and water. If  you cannot use soap and water, use hand sanitizer.  Avoid contact with people who have cold symptoms.  Try not to touch your hands to your mouth, nose, or eyes.  Make sure to get the flu shot every year. Contact a doctor if:  Your symptoms do not get better in 2 weeks. Get help right away if:  You cough up blood.  You have chest pain.  You have very bad shortness of breath.  You become dehydrated.  You faint (pass out) or keep feeling like you are going to pass out.  You keep throwing up (vomiting).  You have a very bad headache.  Your fever or chills gets worse. This information is not intended to replace advice given to you by your health care provider. Make sure you discuss any questions you have with your health care provider. Document Released: 04/13/2008 Document Revised: 06/03/2016 Document Reviewed: 04/15/2016 Elsevier Interactive Patient Education  2017 [employer].

## 2016-12-15 ENCOUNTER — Other Ambulatory Visit (INDEPENDENT_AMBULATORY_CARE_PROVIDER_SITE_OTHER): Payer: Medicare Other

## 2016-12-15 ENCOUNTER — Ambulatory Visit (INDEPENDENT_AMBULATORY_CARE_PROVIDER_SITE_OTHER): Payer: Medicare Other | Admitting: Internal Medicine

## 2016-12-15 ENCOUNTER — Encounter: Payer: Self-pay | Admitting: Internal Medicine

## 2016-12-15 VITALS — BP 164/90 | HR 81 | Temp 97.9°F | Ht 63.5 in | Wt 172.0 lb

## 2016-12-15 DIAGNOSIS — E538 Deficiency of other specified B group vitamins: Secondary | ICD-10-CM | POA: Diagnosis not present

## 2016-12-15 DIAGNOSIS — R7301 Impaired fasting glucose: Secondary | ICD-10-CM

## 2016-12-15 DIAGNOSIS — H01003 Unspecified blepharitis right eye, unspecified eyelid: Secondary | ICD-10-CM | POA: Diagnosis not present

## 2016-12-15 DIAGNOSIS — R5383 Other fatigue: Secondary | ICD-10-CM | POA: Insufficient documentation

## 2016-12-15 DIAGNOSIS — Z961 Presence of intraocular lens: Secondary | ICD-10-CM | POA: Diagnosis not present

## 2016-12-15 DIAGNOSIS — H10413 Chronic giant papillary conjunctivitis, bilateral: Secondary | ICD-10-CM | POA: Diagnosis not present

## 2016-12-15 DIAGNOSIS — H01006 Unspecified blepharitis left eye, unspecified eyelid: Secondary | ICD-10-CM | POA: Diagnosis not present

## 2016-12-15 LAB — VITAMIN B12: Vitamin B-12: 398 pg/mL (ref 211–911)

## 2016-12-15 LAB — CBC
HEMATOCRIT: 39.8 % (ref 36.0–46.0)
Hemoglobin: 12.9 g/dL (ref 12.0–15.0)
MCHC: 32.4 g/dL (ref 30.0–36.0)
MCV: 80.6 fl (ref 78.0–100.0)
PLATELETS: 216 10*3/uL (ref 150.0–400.0)
RBC: 4.94 Mil/uL (ref 3.87–5.11)
RDW: 14.7 % (ref 11.5–15.5)
WBC: 6.8 10*3/uL (ref 4.0–10.5)

## 2016-12-15 LAB — COMPREHENSIVE METABOLIC PANEL
ALT: 9 U/L (ref 0–35)
AST: 22 U/L (ref 0–37)
Albumin: 3.9 g/dL (ref 3.5–5.2)
Alkaline Phosphatase: 61 U/L (ref 39–117)
BUN: 18 mg/dL (ref 6–23)
CALCIUM: 9.4 mg/dL (ref 8.4–10.5)
CHLORIDE: 107 meq/L (ref 96–112)
CO2: 28 meq/L (ref 19–32)
CREATININE: 0.91 mg/dL (ref 0.40–1.20)
GFR: 76.66 mL/min (ref >60.00)
Glucose, Bld: 84 mg/dL (ref 70–99)
Potassium: 4.2 mEq/L (ref 3.5–5.1)
Sodium: 141 mEq/L (ref 135–145)
Total Bilirubin: 1 mg/dL (ref 0.2–1.2)
Total Protein: 7.3 g/dL (ref 6.0–8.3)

## 2016-12-15 LAB — HEMOGLOBIN A1C: Hgb A1c MFr Bld: 5.5 % (ref 4.6–6.5)

## 2016-12-15 LAB — TSH: TSH: 2.02 u[IU]/mL (ref 0.35–4.50)

## 2016-12-15 MED ORDER — CELECOXIB 200 MG PO CAPS: 200.0000 mg | ORAL_CAPSULE | Freq: Two times a day (BID) | ORAL | 5 refills | Status: DC

## 2016-12-15 MED ORDER — TRAMADOL HCL 50 MG PO TABS: 25.0000 mg | ORAL_TABLET | Freq: Two times a day (BID) | ORAL | 1 refills | Status: DC

## 2016-12-15 NOTE — Assessment & Plan Note (Signed)
Checking HgA1c, B12, TSH, CMP, CBC for any changes. BP is elevated today but she insists that it is normal at home and will watch. High BP could be causing some headaches but likely not the fatigue. Lungs clear on exam and no antibiotics or steroids needed today.

## 2016-12-15 NOTE — Progress Notes (Signed)
   Subjective:    Patient ID: Emily Bentley, female    DOB: Sep 24, 1935, 82 y.o.   MRN: [id number]  HPI The patient is a 82 YO female coming in for some breathing problems over the winter. She has been treated twice for bronchitis in the last 2 months. She denies cough now but is having some fatigue and headaches from time to time. She denies SOB or chest pains. She denies fevers or chills. No rash. She is feeling weak some since the last time.   Review of Systems  Constitutional: Positive for activity change and fatigue. Negative for appetite change, chills, fever and unexpected weight change.  HENT: Negative.   Eyes: Negative.   Respiratory: Negative for cough, chest tightness, shortness of breath and wheezing.   Cardiovascular: Negative.   Gastrointestinal: Negative.   Musculoskeletal: Positive for arthralgias. Negative for back pain, gait problem, joint swelling and myalgias.  Skin: Negative.   Neurological: Positive for weakness. Negative for dizziness, facial asymmetry, light-headedness and numbness.      Objective:   Physical Exam  Constitutional: She is oriented to person, place, and time. She appears well-developed and well-nourished.  HENT:  Head: Normocephalic and atraumatic.  Right Ear: External ear normal.  Left Ear: External ear normal.  Eyes: EOM are normal.  Neck: Normal range of motion. No JVD present. No thyromegaly present.  Cardiovascular: Normal rate and regular rhythm.   Pulmonary/Chest: Effort normal and breath sounds normal. No respiratory distress. She has no wheezes. She has no rales.  Abdominal: Soft. Bowel sounds are normal. She exhibits no distension. There is no tenderness. There is no rebound.  Neurological: She is alert and oriented to person, place, and time.  Skin: Skin is warm and dry.  Psychiatric: She has a normal mood and affect.   Vitals:   12/15/16 1520 12/15/16 1538  BP: (!) 180/100 (!) 164/90  Pulse: 81   Temp: 97.9 F (36.6 C)     TempSrc: Oral   SpO2: 100%   Weight: 172 lb (78 kg)   Height: 5' 3.5" (1.613 m)       Assessment & Plan:

## 2016-12-15 NOTE — Patient Instructions (Signed)
We are checking the labs today.   Your lungs sound perfectly normal today.

## 2016-12-15 NOTE — Progress Notes (Signed)
Pre visit review using our clinic review tool, if applicable. No additional management support is needed unless otherwise documented below in the visit note. 

## 2017-02-04 ENCOUNTER — Telehealth: Payer: Self-pay | Admitting: Internal Medicine

## 2017-02-04 NOTE — Telephone Encounter (Signed)
Called patient to schedule awv. Lvm for patient to call office to schedule appt.  °

## 2017-06-27 DIAGNOSIS — H903 Sensorineural hearing loss, bilateral: Secondary | ICD-10-CM | POA: Diagnosis not present

## 2017-08-25 DIAGNOSIS — H353131 Nonexudative age-related macular degeneration, bilateral, early dry stage: Secondary | ICD-10-CM | POA: Diagnosis not present

## 2017-08-25 DIAGNOSIS — H43813 Vitreous degeneration, bilateral: Secondary | ICD-10-CM | POA: Diagnosis not present

## 2017-09-15 DIAGNOSIS — Z1231 Encounter for screening mammogram for malignant neoplasm of breast: Secondary | ICD-10-CM | POA: Diagnosis not present

## 2017-09-15 LAB — HM MAMMOGRAPHY

## 2017-09-16 ENCOUNTER — Encounter: Payer: Self-pay | Admitting: Internal Medicine

## 2018-02-03 ENCOUNTER — Other Ambulatory Visit (INDEPENDENT_AMBULATORY_CARE_PROVIDER_SITE_OTHER): Payer: Medicare Other

## 2018-02-03 ENCOUNTER — Encounter: Payer: Self-pay | Admitting: Internal Medicine

## 2018-02-03 ENCOUNTER — Ambulatory Visit (INDEPENDENT_AMBULATORY_CARE_PROVIDER_SITE_OTHER): Payer: Medicare Other | Admitting: Internal Medicine

## 2018-02-03 VITALS — BP 140/90 | HR 70 | Temp 97.9°F | Ht 63.5 in | Wt 173.0 lb

## 2018-02-03 DIAGNOSIS — Z9621 Cochlear implant status: Secondary | ICD-10-CM | POA: Diagnosis not present

## 2018-02-03 DIAGNOSIS — E559 Vitamin D deficiency, unspecified: Secondary | ICD-10-CM

## 2018-02-03 DIAGNOSIS — E538 Deficiency of other specified B group vitamins: Secondary | ICD-10-CM

## 2018-02-03 DIAGNOSIS — R5383 Other fatigue: Secondary | ICD-10-CM

## 2018-02-03 DIAGNOSIS — M5136 Other intervertebral disc degeneration, lumbar region: Secondary | ICD-10-CM | POA: Diagnosis not present

## 2018-02-03 DIAGNOSIS — F419 Anxiety disorder, unspecified: Secondary | ICD-10-CM

## 2018-02-03 DIAGNOSIS — Z23 Encounter for immunization: Secondary | ICD-10-CM | POA: Diagnosis not present

## 2018-02-03 DIAGNOSIS — E785 Hyperlipidemia, unspecified: Secondary | ICD-10-CM

## 2018-02-03 LAB — COMPREHENSIVE METABOLIC PANEL
ALT: 9 U/L (ref 0–35)
AST: 24 U/L (ref 0–37)
Albumin: 3.7 g/dL (ref 3.5–5.2)
Alkaline Phosphatase: 73 U/L (ref 39–117)
BUN: 17 mg/dL (ref 6–23)
CO2: 28 meq/L (ref 19–32)
CREATININE: 1.03 mg/dL (ref 0.40–1.20)
Calcium: 9.3 mg/dL (ref 8.4–10.5)
Chloride: 105 mEq/L (ref 96–112)
GFR: 66.26 mL/min (ref >60.00)
GLUCOSE: 92 mg/dL (ref 70–99)
Potassium: 4.3 mEq/L (ref 3.5–5.1)
Sodium: 139 mEq/L (ref 135–145)
Total Bilirubin: 1 mg/dL (ref 0.2–1.2)
Total Protein: 7.2 g/dL (ref 6.0–8.3)

## 2018-02-03 LAB — LIPID PANEL
CHOL/HDL RATIO: 3
Cholesterol: 182 mg/dL (ref 0–200)
HDL: 57.5 mg/dL (ref >39.00)
LDL Cholesterol: 110 mg/dL — ABNORMAL HIGH (ref 0–99)
NONHDL: 124.34
Triglycerides: 71 mg/dL (ref 0.0–149.0)
VLDL: 14.2 mg/dL (ref 0.0–40.0)

## 2018-02-03 LAB — CBC
HCT: 40.2 % (ref 36.0–46.0)
Hemoglobin: 12.8 g/dL (ref 12.0–15.0)
MCHC: 31.9 g/dL (ref 30.0–36.0)
MCV: 81 fl (ref 78.0–100.0)
Platelets: 225 10*3/uL (ref 150.0–400.0)
RBC: 4.95 Mil/uL (ref 3.87–5.11)
RDW: 14.3 % (ref 11.5–15.5)
WBC: 7.1 10*3/uL (ref 4.0–10.5)

## 2018-02-03 LAB — VITAMIN B12: VITAMIN B 12: 355 pg/mL (ref 211–911)

## 2018-02-03 LAB — VITAMIN D 25 HYDROXY (VIT D DEFICIENCY, FRACTURES): VITD: 19.25 ng/mL — ABNORMAL LOW (ref 30.00–100.00)

## 2018-02-03 LAB — TSH: TSH: 2.58 u[IU]/mL (ref 0.35–4.50)

## 2018-02-03 MED ORDER — FLUTICASONE PROPIONATE 50 MCG/ACT NA SUSP: 2.0000 | Freq: Every day | NASAL | 6 refills | Status: DC

## 2018-02-03 MED ORDER — SOLIFENACIN SUCCINATE 5 MG PO TABS: 5.0000 mg | ORAL_TABLET | Freq: Every day | ORAL | 3 refills | Status: DC

## 2018-02-03 MED ORDER — TRAMADOL HCL 50 MG PO TABS: 25.0000 mg | ORAL_TABLET | Freq: Two times a day (BID) | ORAL | 1 refills | Status: DC

## 2018-02-03 MED ORDER — ALPRAZOLAM 0.5 MG PO TABS: 0.5000 mg | ORAL_TABLET | Freq: Every evening | ORAL | 0 refills | Status: DC | PRN

## 2018-02-03 MED ORDER — ZOSTER VAC RECOMB ADJUVANTED 50 MCG/0.5ML IM SUSR: 0.5000 mL | Freq: Once | INTRAMUSCULAR | 1 refills | Status: AC

## 2018-02-03 NOTE — Progress Notes (Signed)
   Subjective:    Patient ID: Emily Bentley, female    DOB: 26-Nov-1934, 83 y.o.   MRN: [id number]  HPI The patient is an 83 YO female coming in for chronic pain back (hurts worse with standing at work on concrete, she takes 1/2 pill tramadol at times, uses aleve on days she is not working, denies injury or overuse, no flare recently but stable and chronic) and anxiety (takes xanax sometimes for sleep or anxiety, she is still working and denies depression, she denies needing more often lately, max 1 per day, denies using more than prescribed) and fatigue (tired all the time, generally sleeps well with the xanax, denies feeling tired when she wakes, is not snoring, denies change to diet, not exercising, still working several days per week).   Review of Systems  Constitutional: Negative.   HENT: Positive for hearing loss.   Eyes: Negative.   Respiratory: Negative for cough, chest tightness and shortness of breath.   Cardiovascular: Negative for chest pain, palpitations and leg swelling.  Gastrointestinal: Negative for abdominal distention, abdominal pain, constipation, diarrhea, nausea and vomiting.  Musculoskeletal: Positive for back pain.  Skin: Negative.   Neurological: Negative.   Psychiatric/Behavioral: Positive for sleep disturbance. Negative for agitation, behavioral problems, confusion, dysphoric mood and self-injury. The patient is nervous/anxious.       Objective:   Physical Exam  Constitutional: She is oriented to person, place, and time. She appears well-developed and well-nourished.  HENT:  Head: Normocephalic and atraumatic.  Cochlear implant  Eyes: EOM are normal.  Neck: Normal range of motion.  Cardiovascular: Normal rate and regular rhythm.  Pulmonary/Chest: Effort normal and breath sounds normal. No respiratory distress. She has no wheezes. She has no rales.  Abdominal: Soft. Bowel sounds are normal. She exhibits no distension. There is no tenderness. There is no  rebound.  Musculoskeletal: She exhibits no edema.  Neurological: She is alert and oriented to person, place, and time. Coordination normal.  Skin: Skin is warm and dry.  Psychiatric: She has a normal mood and affect.   Vitals:   02/03/18 1431  BP: 140/90  Pulse: 70  Temp: 97.9 F (36.6 C)  TempSrc: Oral  SpO2: 99%  Weight: 173 lb (78.5 kg)  Height: 5' 3.5" (1.613 m)      Assessment & Plan:  Tdap given at visit

## 2018-02-03 NOTE — Patient Instructions (Signed)
We have given you the tetanus shot.   We have given you a prescription for the shingles vaccine to take to the pharmacy.   Health Maintenance, Female Adopting a healthy lifestyle and getting preventive care can go a long way to promote health and wellness. Talk with your health care provider about what schedule of regular examinations is right for you. This is a good chance for you to check in with your provider about disease prevention and staying healthy. In between checkups, there are plenty of things you can do on your own. Experts have done a lot of research about which lifestyle changes and preventive measures are most likely to keep you healthy. Ask your health care provider for more information. Weight and diet Eat a healthy diet  Be sure to include plenty of vegetables, fruits, low-fat dairy products, and lean protein.  Do not eat a lot of foods high in solid fats, added sugars, or salt.  Get regular exercise. This is one of the most important things you can do for your health. ? Most adults should exercise for at least 150 minutes each week. The exercise should increase your heart rate and make you sweat (moderate-intensity exercise). ? Most adults should also do strengthening exercises at least twice a week. This is in addition to the moderate-intensity exercise.  Maintain a healthy weight  Body mass index (BMI) is a measurement that can be used to identify possible weight problems. It estimates body fat based on height and weight. Your health care provider can help determine your BMI and help you achieve or maintain a healthy weight.  For females 33 years of age and older: ? A BMI below 18.5 is considered underweight. ? A BMI of 18.5 to 24.9 is normal. ? A BMI of 25 to 29.9 is considered overweight. ? A BMI of 30 and above is considered obese.  Watch levels of cholesterol and blood lipids  You should start having your blood tested for lipids and cholesterol at 81 years of  age, then have this test every 5 years.  You may need to have your cholesterol levels checked more often if: ? Your lipid or cholesterol levels are high. ? You are older than 81 years of age. ? You are at high risk for heart disease.  Cancer screening Lung Cancer  Lung cancer screening is recommended for adults 23-58 years old who are at high risk for lung cancer because of a history of smoking.  A yearly low-dose CT scan of the lungs is recommended for people who: ? Currently smoke. ? Have quit within the past 15 years. ? Have at least a 30-pack-year history of smoking. A pack year is smoking an average of one pack of cigarettes a day for 1 year.  Yearly screening should continue until it has been 15 years since you quit.  Yearly screening should stop if you develop a health problem that would prevent you from having lung cancer treatment.  Breast Cancer  Practice breast self-awareness. This means understanding how your breasts normally appear and feel.  It also means doing regular breast self-exams. Let your health care provider know about any changes, no matter how small.  If you are in your 20s or 30s, you should have a clinical breast exam (CBE) by a health care provider every 1-3 years as part of a regular health exam.  If you are 98 or older, have a CBE every year. Also consider having a breast X-ray (mammogram) every year.  If you have a family history of breast cancer, talk to your health care provider about genetic screening.  If you are at high risk for breast cancer, talk to your health care provider about having an MRI and a mammogram every year.  Breast cancer gene (BRCA) assessment is recommended for women who have family members with BRCA-related cancers. BRCA-related cancers include: ? Breast. ? Ovarian. ? Tubal. ? Peritoneal cancers.  Results of the assessment will determine the need for genetic counseling and BRCA1 and BRCA2 testing.  Cervical  Cancer Your health care provider may recommend that you be screened regularly for cancer of the pelvic organs (ovaries, uterus, and vagina). This screening involves a pelvic examination, including checking for microscopic changes to the surface of your cervix (Pap test). You may be encouraged to have this screening done every 3 years, beginning at age 28.  For women ages 42-65, health care providers may recommend pelvic exams and Pap testing every 3 years, or they may recommend the Pap and pelvic exam, combined with testing for human papilloma virus (HPV), every 5 years. Some types of HPV increase your risk of cervical cancer. Testing for HPV may also be done on women of any age with unclear Pap test results.  Other health care providers may not recommend any screening for nonpregnant women who are considered low risk for pelvic cancer and who do not have symptoms. Ask your health care provider if a screening pelvic exam is right for you.  If you have had past treatment for cervical cancer or a condition that could lead to cancer, you need Pap tests and screening for cancer for at least 20 years after your treatment. If Pap tests have been discontinued, your risk factors (such as having a new sexual partner) need to be reassessed to determine if screening should resume. Some women have medical problems that increase the chance of getting cervical cancer. In these cases, your health care provider may recommend more frequent screening and Pap tests.  Colorectal Cancer  This type of cancer can be detected and often prevented.  Routine colorectal cancer screening usually begins at 81 years of age and continues through 81 years of age.  Your health care provider may recommend screening at an earlier age if you have risk factors for colon cancer.  Your health care provider may also recommend using home test kits to check for hidden blood in the stool.  A small camera at the end of a tube can be used to  examine your colon directly (sigmoidoscopy or colonoscopy). This is done to check for the earliest forms of colorectal cancer.  Routine screening usually begins at age 21.  Direct examination of the colon should be repeated every 5-10 years through 81 years of age. However, you may need to be screened more often if early forms of precancerous polyps or small growths are found.  Skin Cancer  Check your skin from head to toe regularly.  Tell your health care provider about any new moles or changes in moles, especially if there is a change in a mole's shape or color.  Also tell your health care provider if you have a mole that is larger than the size of a pencil eraser.  Always use sunscreen. Apply sunscreen liberally and repeatedly throughout the day.  Protect yourself by wearing long sleeves, pants, a wide-brimmed hat, and sunglasses whenever you are outside.  Heart disease, diabetes, and high blood pressure  High blood pressure causes heart disease and  increases the risk of stroke. High blood pressure is more likely to develop in: ? People who have blood pressure in the high end of the normal range (130-139/85-89 mm Hg). ? People who are overweight or obese. ? People who are African American.  If you are 73-54 years of age, have your blood pressure checked every 3-5 years. If you are 23 years of age or older, have your blood pressure checked every year. You should have your blood pressure measured twice-once when you are at a hospital or clinic, and once when you are not at a hospital or clinic. Record the average of the two measurements. To check your blood pressure when you are not at a hospital or clinic, you can use: ? An automated blood pressure machine at a pharmacy. ? A home blood pressure monitor.  If you are between 67 years and 20 years old, ask your health care provider if you should take aspirin to prevent strokes.  Have regular diabetes screenings. This involves taking a  blood sample to check your fasting blood sugar level. ? If you are at a normal weight and have a low risk for diabetes, have this test once every three years after 81 years of age. ? If you are overweight and have a high risk for diabetes, consider being tested at a younger age or more often. Preventing infection Hepatitis B  If you have a higher risk for hepatitis B, you should be screened for this virus. You are considered at high risk for hepatitis B if: ? You were born in a country where hepatitis B is common. Ask your health care provider which countries are considered high risk. ? Your parents were born in a high-risk country, and you have not been immunized against hepatitis B (hepatitis B vaccine). ? You have HIV or AIDS. ? You use needles to inject street drugs. ? You live with someone who has hepatitis B. ? You have had sex with someone who has hepatitis B. ? You get hemodialysis treatment. ? You take certain medicines for conditions, including cancer, organ transplantation, and autoimmune conditions.  Hepatitis C  Blood testing is recommended for: ? Everyone born from [birth year] through 1963. ? Anyone with known risk factors for hepatitis C.  Sexually transmitted infections (STIs)  You should be screened for sexually transmitted infections (STIs) including gonorrhea and chlamydia if: ? You are sexually active and are younger than 81 years of age. ? You are older than 81 years of age and your health care provider tells you that you are at risk for this type of infection. ? Your sexual activity has changed since you were last screened and you are at an increased risk for chlamydia or gonorrhea. Ask your health care provider if you are at risk.  If you do not have HIV, but are at risk, it may be recommended that you take a prescription medicine daily to prevent HIV infection. This is called pre-exposure prophylaxis (PrEP). You are considered at risk if: ? You are sexually active and  do not regularly use condoms or know the HIV status of your partner(s). ? You take drugs by injection. ? You are sexually active with a partner who has HIV.  Talk with your health care provider about whether you are at high risk of being infected with HIV. If you choose to begin PrEP, you should first be tested for HIV. You should then be tested every 3 months for as long as you are taking  PrEP. Pregnancy  If you are premenopausal and you may become pregnant, ask your health care provider about preconception counseling.  If you may become pregnant, take 400 to 800 micrograms (mcg) of folic acid every day.  If you want to prevent pregnancy, talk to your health care provider about birth control (contraception). Osteoporosis and menopause  Osteoporosis is a disease in which the bones lose minerals and strength with aging. This can result in serious bone fractures. Your risk for osteoporosis can be identified using a bone density scan.  If you are 45 years of age or older, or if you are at risk for osteoporosis and fractures, ask your health care provider if you should be screened.  Ask your health care provider whether you should take a calcium or vitamin D supplement to lower your risk for osteoporosis.  Menopause may have certain physical symptoms and risks.  Hormone replacement therapy may reduce some of these symptoms and risks. Talk to your health care provider about whether hormone replacement therapy is right for you. Follow these instructions at home:  Schedule regular health, dental, and eye exams.  Stay current with your immunizations.  Do not use any tobacco products including cigarettes, chewing tobacco, or electronic cigarettes.  If you are pregnant, do not drink alcohol.  If you are breastfeeding, limit how much and how often you drink alcohol.  Limit alcohol intake to no more than 1 drink per day for nonpregnant women. One drink equals 12 ounces of beer, 5 ounces of  wine, or 1 ounces of hard liquor.  Do not use street drugs.  Do not share needles.  Ask your health care provider for help if you need support or information about quitting drugs.  Tell your health care provider if you often feel depressed.  Tell your health care provider if you have ever been abused or do not feel safe at home. This information is not intended to replace advice given to you by your health care provider. Make sure you discuss any questions you have with your health care provider. Document Released: 05/11/2011 Document Revised: 04/02/2016 Document Reviewed: 07/30/2015 Elsevier Interactive Patient Education  [employer].

## 2018-02-04 DIAGNOSIS — F419 Anxiety disorder, unspecified: Secondary | ICD-10-CM | POA: Insufficient documentation

## 2018-02-04 NOTE — Assessment & Plan Note (Signed)
Checking vitamin D, B12, CMP, CBC. Adjust as needed.

## 2018-02-04 NOTE — Assessment & Plan Note (Signed)
Takes xanax, refilled at visit. Reminded about risk of dependence, memory change, increased risk of falls. She wishes to continue.

## 2018-02-04 NOTE — Assessment & Plan Note (Signed)
Refill tramadol for rare usage. She admits to using 2 times per week but [place] narcotic database reviewed and not filling more than 2 times per year which would indicate using far less often. Advised about risk of dependence, addiction, increased risk of falls and memory problems. She wishes to continue when needed and aleve on days it does not hurt bad.

## 2018-06-08 ENCOUNTER — Other Ambulatory Visit: Payer: Self-pay | Admitting: Internal Medicine

## 2018-06-08 NOTE — Telephone Encounter (Signed)
Please advise per Dr. Nathanial Millman absence  Control database checked last refill: 04/19/2018 LOV: 01/14/2018

## 2018-06-08 NOTE — Telephone Encounter (Signed)
Done erx 

## 2018-07-04 DIAGNOSIS — H353131 Nonexudative age-related macular degeneration, bilateral, early dry stage: Secondary | ICD-10-CM | POA: Diagnosis not present

## 2018-08-11 ENCOUNTER — Ambulatory Visit: Payer: Self-pay

## 2018-08-11 NOTE — Telephone Encounter (Signed)
Patient called in with c/o "left side neck swelling." She says "the left side of my neck is swollen and it must have done it today, because it was fine this morning. It's not painful, but my throat does feel a little funny." I asked the size of the swelling, she says "like the mumps is all I know to say." I asked about other symptoms, fever, she says "I'm lightheaded, I have a light cough, and my ears feel like they are plugged. I have a little sniffle, but figure that's my allergies." According to protocol, see PCP within 24 hours, no availability with PCP, appointment scheduled for tomorrow, 08/12/18 at 1300 with Caesar Chestnut, NP, care advice given, patient verbalized understanding.   Reason for Disposition . [1] Single large node AND [2] size > 1 inch (2.5 cm) AND [3] no fever  Answer Assessment - Initial Assessment Questions 1. LOCATION: "Where is the swollen node located?" "Is the matching node on the other side of the body also swollen?"      Left side of neck 2. SIZE: "How big is the node?" (Inches or centimeters) (or compare to common objects such as pea, bean, marble, golf ball)      Looks like the mumps 3. ONSET: "When did the swelling start?"      Today 4. NECK NODES: "Is there a sore throat, runny nose or other symptoms of a cold?"      Throat feels funny, not sore, a little sniffle 5. GROIN OR ARMPIT NODES: "Is there a sore, scratch, cut or painful red area on that arm or leg?"      N/A 6. FEVER: "Do you have a fever?" If so, ask: "What is it, how was it measured, and when did it start?"     No 7. CAUSE: "What do you think is causing the swollen lymph nodes?"     I don't know 8. OTHER SYMPTOMS: "Do you have any other symptoms?"     Lightheaded, cough, ears feel like plugs in them 9. PREGNANCY: "Is there any chance you are pregnant?" "When was your last menstrual period?"     No  Protocols used: [place]

## 2018-08-12 ENCOUNTER — Ambulatory Visit (INDEPENDENT_AMBULATORY_CARE_PROVIDER_SITE_OTHER): Payer: Medicare Other | Admitting: Nurse Practitioner

## 2018-08-12 ENCOUNTER — Encounter: Payer: Self-pay | Admitting: Nurse Practitioner

## 2018-08-12 VITALS — BP 160/100 | HR 76 | Temp 97.7°F | Ht 63.5 in | Wt 166.0 lb

## 2018-08-12 DIAGNOSIS — R030 Elevated blood-pressure reading, without diagnosis of hypertension: Secondary | ICD-10-CM | POA: Diagnosis not present

## 2018-08-12 DIAGNOSIS — R221 Localized swelling, mass and lump, neck: Secondary | ICD-10-CM

## 2018-08-12 MED ORDER — PREDNISONE 20 MG PO TABS: 20.0000 mg | ORAL_TABLET | Freq: Every day | ORAL | 0 refills | Status: DC

## 2018-08-12 MED ORDER — PREDNISONE 20 MG PO TABS: 40.0000 mg | ORAL_TABLET | Freq: Every day | ORAL | 0 refills | Status: DC

## 2018-08-12 NOTE — Patient Instructions (Addendum)
Please take prednisone 40mg  daily for 5 days for your neck pain and swelling  Please schedule follow up with Dr Sharlet Salina in 2 weeks so she can look at your neck and recheck your blood pressure.   Musculoskeletal Pain Musculoskeletal pain is muscle and bone aches and pains. This pain can occur in any part of the body. Follow these instructions at home:  Only take medicines for pain, discomfort, or fever as told by your health care provider.  You may continue all activities unless the activities cause more pain. When the pain lessens, slowly resume normal activities. Gradually increase the intensity and duration of the activities or exercise.  During periods of severe pain, bed rest may be helpful. Lie or sit in any position that is comfortable, but get out of bed and walk around at least every several hours.  If directed, put ice on the injured area. ? Put ice in a plastic bag. ? Place a towel between your skin and the bag. ? Leave the ice on for 20 minutes, 2-3 times a day. Contact a health care provider if:  Your pain is getting worse.  Your pain is not relieved with medicines.  You lose function in the area of the pain if the pain is in your arms, legs, or neck. This information is not intended to replace advice given to you by your health care provider. Make sure you discuss any questions you have with your health care provider. Document Released: 10/26/2005 Document Revised: 04/07/2016 Document Reviewed: 06/30/2013 Elsevier Interactive Patient Education  2017 [employer].

## 2018-08-12 NOTE — Progress Notes (Signed)
Emily Bentley is a 83 y.o. female with the following history as recorded in [employer]:  Patient Active Problem List   Diagnosis Date Noted  . Anxiety 02/04/2018  . Fatigue 12/15/2016  . Degenerative disc disease, lumbar 09/16/2015  . OAB (overactive bladder) 05/25/2012  . OSTEOPENIA 10/09/2009  . Cochlear implant in place 12/30/2007  . PSVT 12/30/2007  . GERD 12/30/2007    Current Outpatient Medications  Medication Sig Dispense Refill  . acetaminophen (TYLENOL) 650 MG CR tablet Take 650 mg by mouth every 8 (eight) hours as needed for pain.    Marland Kitchen ALPRAZolam (XANAX) 0.5 MG tablet Take 1 tablet (0.5 mg total) by mouth at bedtime as needed for anxiety. 30 tablet 0  . calcium-vitamin D (OSCAL WITH D) 250-125 MG-UNIT tablet Take 1 tablet by mouth daily.    . celecoxib (CELEBREX) 200 MG capsule Take 1 capsule (200 mg total) by mouth 2 (two) times daily. 60 capsule 5  . cetirizine (ZYRTEC) 5 MG tablet Take 5 mg by mouth daily.    . cholecalciferol (VITAMIN D) 1000 units tablet Take 2,000 Units by mouth daily.    Marland Kitchen esomeprazole (NEXIUM) 20 MG capsule Take 20 mg by mouth daily at 12 noon.    . fluticasone (FLONASE) 50 MCG/ACT nasal spray Place 2 sprays into both nostrils daily. 48 g 6  . solifenacin (VESICARE) 5 MG tablet Take 1 tablet (5 mg total) by mouth daily. 90 tablet 3  . traMADol (ULTRAM) 50 MG tablet TAKE 1/2 TO 1 TABLET BY MOUTH 2 TIMES DAILY 30 tablet 0  . predniSONE (DELTASONE) 20 MG tablet Take 2 tablets (40 mg total) by mouth daily with breakfast. 10 tablet 0   No current facility-administered medications for this visit.     Allergies: Sulfamethoxazole-trimethoprim  Past Medical History:  Diagnosis Date  . Allergy   . Arthritis   . Asthma   . Cataract   . Chronic allergic rhinitis    with asthmatic component  . Diverticulosis of colon (without mention of hemorrhage)   . GERD (gastroesophageal reflux disease)   . Hearing impairment   . Hiatal hernia   . History of  bursitis    right shoulder  . History of colon polyps   . History of pneumonia   . History of PSVT (paroxysmal supraventricular tachycardia)   . Hormone replacement therapy (postmenopausal)   . IBS (irritable bowel syndrome)   . Peptic stricture of esophagus   . Watermelon stomach     Past Surgical History:  Procedure Laterality Date  . ABDOMINAL HYSTERECTOMY    . APPENDECTOMY    . CHOLECYSTECTOMY    . COCHLEAR IMPLANT Left Oct '14   Done at [facility] - great results  . EYE SURGERY    . POLYPECTOMY    . TUBAL LIGATION    . urethral dilatations    . VENTRICULAR ABLATION SURGERY      Family History  Problem Relation Age of Onset  . Diabetes Mother   . Uterine cancer Mother   . Coronary artery disease Father        CABG x 5 in 1985  . Hypertension Father   . Colon cancer Neg Hx     Social History   Tobacco Use  . Smoking status: Never Smoker  . Smokeless tobacco: Never Used  Substance Use Topics  . Alcohol use: No     Subjective:  Emily Bentley is here today for evaluation of an acute complaint of left sided  neck pain and swelling, left upper chest swelling, sore throat, which she first noticed when waking yesterday am. Does feel hard to swallow at times but she denies any trouble breathing. She also says she has felt "off, woozy", noticed some mild headaches over past few weeks.  She denies fevers, chills, confusion, loss of consciousness, congestion, chest pain, shortness of breath, cough, falls, injuries. 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 Not  A smoker. Her blood pressure is elevated today, she is not maintained on BP medications, she says she has been very stressed recently due to her son having brain surgery.  BP Readings from Last 3 Encounters:  08/12/18 (!) 160/100  02/03/18 140/90  12/15/16 (!) 164/90   ROS-see HPI  Objective:  Vitals:   08/12/18 1302  BP: (!) 160/100  Pulse: 76  Temp: 97.7 F (36.5 C)  TempSrc: Oral   SpO2: 98%  Weight: 166 lb (75.3 kg)  Height: 5' 3.5" (1.613 m)    General: Well developed, well nourished, in no acute distress  Skin : Warm and dry. No rash, no erythema Head: Normocephalic and atraumatic  Eyes: Sclera and conjunctiva clear; pupils round and reactive to light; extraocular movements intact  Oropharynx: Pink, supple. No suspicious lesions, no oral edema. Neck: Supple without thyromegaly, adenopathy. Lungs: Respirations unlabored; clear to auscultation bilaterally without wheeze, rales, rhonchi  CVS exam: normal rate, regular rhythm, normal S1, S2, distal pulses intact. Musculoskeletal: left anterior neck tenderness, swelling and tenderness above left clavicle. Neurologic: Alert and oriented; speech intact; face symmetrical; moves all extremities well; CNII-XII intact without focal deficit.   Assessment:  1. Neck swelling   2. Elevated blood pressure reading     Plan:   1. Neck swelling Unknown etiology, could be MSK strain, will treat with course of prednisone and have her F/U with PCP in 2 weeks for recheck Home management, red flags and return precautions including when to seek immediate care discussed and printed on AVS - predniSONE (DELTASONE) 20 MG tablet; Take 2 tablets (40 mg total) by mouth daily with breakfast.  Dispense: 10 tablet; Refill: 0  2. Elevated blood pressure reading Per chart review, some normal and some elevated readings in the past, will have her follow up with PCP in 2 weeks to recheck BP and determine need for antihypertensives   Return in about 2 weeks (around 08/26/2018).  No orders of the defined types were placed in this encounter.   Requested Prescriptions   Signed Prescriptions Disp Refills  . predniSONE (DELTASONE) 20 MG tablet 10 tablet 0    Sig: Take 2 tablets (40 mg total) by mouth daily with breakfast.

## 2018-08-22 ENCOUNTER — Ambulatory Visit (INDEPENDENT_AMBULATORY_CARE_PROVIDER_SITE_OTHER): Payer: Medicare Other | Admitting: Internal Medicine

## 2018-08-22 ENCOUNTER — Encounter: Payer: Self-pay | Admitting: Internal Medicine

## 2018-08-22 VITALS — BP 150/82 | HR 64 | Temp 97.8°F | Ht 63.5 in | Wt 168.0 lb

## 2018-08-22 DIAGNOSIS — M5136 Other intervertebral disc degeneration, lumbar region: Secondary | ICD-10-CM | POA: Diagnosis not present

## 2018-08-22 DIAGNOSIS — R030 Elevated blood-pressure reading, without diagnosis of hypertension: Secondary | ICD-10-CM

## 2018-08-22 DIAGNOSIS — K5900 Constipation, unspecified: Secondary | ICD-10-CM | POA: Diagnosis not present

## 2018-08-22 DIAGNOSIS — M51369 Other intervertebral disc degeneration, lumbar region without mention of lumbar back pain or lower extremity pain: Secondary | ICD-10-CM

## 2018-08-22 DIAGNOSIS — R300 Dysuria: Secondary | ICD-10-CM | POA: Diagnosis not present

## 2018-08-22 DIAGNOSIS — N3000 Acute cystitis without hematuria: Secondary | ICD-10-CM

## 2018-08-22 LAB — POCT URINALYSIS DIPSTICK
Bilirubin, UA: NEGATIVE
GLUCOSE UA: NEGATIVE
KETONES UA: NEGATIVE
NITRITE UA: NEGATIVE
Protein, UA: NEGATIVE
RBC UA: POSITIVE
SPEC GRAV UA: 1.02 (ref 1.010–1.025)
Urobilinogen, UA: 0.2 E.U./dL
pH, UA: 6 (ref 5.0–8.0)

## 2018-08-22 MED ORDER — TRAMADOL HCL 50 MG PO TABS: ORAL_TABLET | ORAL | 0 refills | Status: DC

## 2018-08-22 MED ORDER — CIPROFLOXACIN HCL 500 MG PO TABS: 500.0000 mg | ORAL_TABLET | Freq: Two times a day (BID) | ORAL | 0 refills | Status: AC

## 2018-08-22 MED ORDER — LINACLOTIDE 145 MCG PO CAPS: 145.0000 ug | ORAL_CAPSULE | Freq: Every day | ORAL | 5 refills | Status: DC

## 2018-08-22 NOTE — Patient Instructions (Signed)
We have sent in ciprofloxacin to take 1 pill twice a day for 5 days.  We have refilled the tramadol.  We have sent in linzess to take 1 pill daily for constipation.

## 2018-08-22 NOTE — Progress Notes (Signed)
   Subjective:    Patient ID: Emily [facility], female    DOB: 1935-07-18, 83 y.o.   MRN: [id number]  HPI The patient is an 83 YO female coming in for follow up of neck lesion (seen about 2 weeks ago and large painful lesion left neck area, this has gone down with antibiotic treatment and warm compresses, she denies pain in that area now and she feels all gone), and blood pressure (BP was significantly elevated at prior visit, medications not adjusted due to illness, they are running normal at home, denies headaches or chest pains), and possible UTI (having urgency and burning for 2-3 days, tried drinking more fluids which has not helped, overall worsening, denies fevers or chills, no stomach or back pain).   Review of Systems  Constitutional: Negative.   HENT: Negative.   Eyes: Negative.   Respiratory: Negative.  Negative for cough, chest tightness and shortness of breath.   Cardiovascular: Negative.  Negative for chest pain, palpitations and leg swelling.  Gastrointestinal: Positive for constipation. Negative for abdominal distention, abdominal pain, diarrhea, nausea and vomiting.  Genitourinary: Positive for dysuria, frequency and urgency.  Musculoskeletal: Negative.   Skin: Negative.   Neurological: Negative.   Psychiatric/Behavioral: Negative.       Objective:   Physical Exam  Constitutional: She is oriented to person, place, and time. She appears well-developed and well-nourished.  HENT:  Head: Normocephalic and atraumatic.  Eyes: EOM are normal.  Neck: Normal range of motion.  Cardiovascular: Normal rate and regular rhythm.  Pulmonary/Chest: Effort normal and breath sounds normal. No respiratory distress. She has no wheezes. She has no rales.  Abdominal: Soft. Bowel sounds are normal. She exhibits no distension. There is no tenderness. There is no rebound.  Musculoskeletal: She exhibits no edema.  Neurological: She is alert and oriented to person, place, and time. Coordination  normal.  Skin: Skin is warm and dry.  Psychiatric: She has a normal mood and affect.   Vitals:   08/22/18 1337  BP: (!) 150/82  Pulse: 64  Temp: 97.8 F (36.6 C)  TempSrc: Oral  SpO2: 96%  Weight: 168 lb (76.2 kg)  Height: 5' 3.5" (1.613 m)      Assessment & Plan:

## 2018-08-24 ENCOUNTER — Telehealth: Payer: Self-pay

## 2018-08-24 NOTE — Telephone Encounter (Signed)
PA started on CoverMyMeds KEY: [id number]

## 2018-08-25 ENCOUNTER — Encounter: Payer: Self-pay | Admitting: Internal Medicine

## 2018-08-25 DIAGNOSIS — N390 Urinary tract infection, site not specified: Secondary | ICD-10-CM | POA: Insufficient documentation

## 2018-08-25 DIAGNOSIS — R030 Elevated blood-pressure reading, without diagnosis of hypertension: Secondary | ICD-10-CM | POA: Insufficient documentation

## 2018-08-25 DIAGNOSIS — K5900 Constipation, unspecified: Secondary | ICD-10-CM | POA: Insufficient documentation

## 2018-08-25 NOTE — Assessment & Plan Note (Signed)
U/A done in the office consistent with infection. Rx for cipro given bactrim allergy.

## 2018-08-25 NOTE — Assessment & Plan Note (Signed)
BP is close to normal now and with normal readings at home will closely monitor off meds for now. If persistently high will initiate medication. Advised to lower sodium intake and increase exercise to help.

## 2018-08-25 NOTE — Assessment & Plan Note (Signed)
Rx for linzess 145 and she will call back if dosing adjustment needed.

## 2018-08-25 NOTE — Assessment & Plan Note (Signed)
Refill tramadol, she does not take outside prescribed limits.

## 2018-08-31 NOTE — Telephone Encounter (Signed)
Linzess denied states diagnosis of unspecified constipation does not establish medical necessity for this drug. Alternatives suggested. Lactulose, miralax, senna, milk of magnesia, sorbitol, metamucil, biscodyl

## 2018-09-01 NOTE — Telephone Encounter (Signed)
Okay, patient can try any of those.

## 2018-11-28 DIAGNOSIS — Z1231 Encounter for screening mammogram for malignant neoplasm of breast: Secondary | ICD-10-CM | POA: Diagnosis not present

## 2018-11-28 LAB — HM MAMMOGRAPHY

## 2018-11-30 ENCOUNTER — Encounter: Payer: Self-pay | Admitting: Internal Medicine

## 2018-11-30 NOTE — Progress Notes (Signed)
Abstracted and sent to scan  

## 2019-03-27 ENCOUNTER — Telehealth: Payer: Self-pay | Admitting: Internal Medicine

## 2019-03-27 NOTE — Telephone Encounter (Signed)
Can do phone call for this

## 2019-03-27 NOTE — Telephone Encounter (Signed)
Patient called complaining of having her equilibrium being off and discomfort in her rib cage and back area, pain is staying constant, she takes a pain pill and the pain goes away for 6 hours, denies chest discomfort, denies SOB, denies trouble breathing, denies fever. Unable to do virtual appt. Please advise

## 2019-03-27 NOTE — Telephone Encounter (Signed)
Covid-19 risk is 3

## 2019-03-28 NOTE — Telephone Encounter (Signed)
LVM for patient to call back and make appt  °

## 2019-03-29 NOTE — Telephone Encounter (Signed)
Telephone scheduled for Friday

## 2019-03-31 ENCOUNTER — Encounter: Payer: Self-pay | Admitting: Internal Medicine

## 2019-03-31 ENCOUNTER — Ambulatory Visit (INDEPENDENT_AMBULATORY_CARE_PROVIDER_SITE_OTHER): Payer: Medicare Other | Admitting: Internal Medicine

## 2019-03-31 DIAGNOSIS — Z9621 Cochlear implant status: Secondary | ICD-10-CM

## 2019-03-31 DIAGNOSIS — M5136 Other intervertebral disc degeneration, lumbar region: Secondary | ICD-10-CM | POA: Diagnosis not present

## 2019-03-31 MED ORDER — TRAMADOL HCL 50 MG PO TABS: ORAL_TABLET | ORAL | 1 refills | Status: DC

## 2019-03-31 MED ORDER — FLUTICASONE PROPIONATE 50 MCG/ACT NA SUSP: 2.0000 | Freq: Every day | NASAL | 6 refills | Status: DC

## 2019-03-31 NOTE — Progress Notes (Signed)
Virtual Visit via Audio only Note  I connected with KAVINA CANTAVE on 03/31/19 at  1:00 PM EDT by an audio only enabled telemedicine application and verified that I am speaking with the correct person using two identifiers.  The patient and the provider were at separate locations throughout the entire encounter.   I discussed the limitations of evaluation and management by telemedicine and the availability of in person appointments. The patient expressed understanding and agreed to proceed.  History of Present Illness: The patient is a 84 y.o. female with visit for pain in the upper back and rib cage. Denies cough or SOB. Started 3 weeks ago and it is improving. Has also some feelings of dizziness with standing and some fluid in her ear. Taking zyrtec but not flonase lately. She is out. Denies fevers or chills or headaches or muscle aches. Overall it is improving. Has tried tylenol and tramadol which have helped well for pain.   Observations/Objective: Voice strong, hearing poor, A and O times 3, no SOB or coughing while on visit  Assessment and Plan: See problem oriented charting  Follow Up Instructions: refill tramadol, flonase and observe symptoms  Visit time 12 minutes: that time was spent in face to face counseling and coordination of care with the patient: counseled about as above  I discussed the assessment and treatment plan with the patient. The patient was provided an opportunity to ask questions and all were answered. The patient agreed with the plan and demonstrated an understanding of the instructions.   The patient was advised to call back or seek an in-person evaluation if the symptoms worsen or if the condition fails to improve as anticipated.  [name], MD

## 2019-03-31 NOTE — Assessment & Plan Note (Signed)
Rx for flonase to help with allergy symptoms with the fluid.

## 2019-03-31 NOTE — Assessment & Plan Note (Signed)
Refill tramadol to help with back pain. Advised on risks and benefits and she will use tylenol first and tramadol only if needed.

## 2019-05-10 ENCOUNTER — Telehealth: Payer: Self-pay

## 2019-05-10 NOTE — Telephone Encounter (Signed)
PA approved 04/10/2019-05/09/2020

## 2019-05-10 NOTE — Telephone Encounter (Signed)
PA started on CoverMyMeds KEY: [id number]

## 2019-05-24 ENCOUNTER — Ambulatory Visit
Admission: EM | Admit: 2019-05-24 | Discharge: 2019-05-24 | Disposition: A | Discharge status: Home / Self Care | Payer: Medicare Other

## 2019-05-24 DIAGNOSIS — M546 Pain in thoracic spine: Secondary | ICD-10-CM

## 2019-05-24 DIAGNOSIS — G8929 Other chronic pain: Secondary | ICD-10-CM

## 2019-05-24 NOTE — Discharge Instructions (Signed)
Take ibuprofen 200-400mg  three times a day (every 8 hours) for 5 days. You can supplement with tylenol for further pain relief. Follow up with PCP for further evaluation if symptoms still not improving.

## 2019-05-24 NOTE — ED Triage Notes (Signed)
Pt c/o pain to upper and lower back for past 2 months. States pain radiates at time. States yesterday was a bad day, today its not as bad. Pt denies injury

## 2019-05-24 NOTE — ED Provider Notes (Signed)
[facility]    [name]: [id number] Arrival date & time: 05/24/19  1039     History   Chief Complaint Chief Complaint  Patient presents with  . Back Pain    HPI [facility] is a 84 y.o. female.   84 year old female comes in for 2 month history of upper and lower back pain. States lower back pain can be normal for her as she has a history of DDD to lumbar region. However, upper back pain is new for her. States pain is bilateral thoracic back that is worse with movement. Denies injury/trauma. Denies loss of grip strength. She worries about her lungs given location of pain. Denies cough, shortness of breath. States pain sometimes can radiate to the front of the chest. Denies breast pain, breast mass, nipple discharge. Last mammogram 11/2018 without masses found. Has been taking tylenol with some relief. States will supplement with tramadol if needed, but does not like to take tramadol.      Past Medical History:  Diagnosis Date  . Allergy   . Arthritis   . Asthma   . Cataract   . Chronic allergic rhinitis    with asthmatic component  . Diverticulosis of colon (without mention of hemorrhage)   . GERD (gastroesophageal reflux disease)   . Hearing impairment   . Hiatal hernia   . History of bursitis    right shoulder  . History of colon polyps   . History of pneumonia   . History of PSVT (paroxysmal supraventricular tachycardia)   . Hormone replacement therapy (postmenopausal)   . IBS (irritable bowel syndrome)   . Peptic stricture of esophagus   . Watermelon stomach     Patient Active Problem List   Diagnosis Date Noted  . Constipation 08/25/2018  . UTI (urinary tract infection) 08/25/2018  . Elevated blood pressure reading in office without diagnosis of hypertension 08/25/2018  . Anxiety 02/04/2018  . Fatigue 12/15/2016  . Degenerative disc disease, lumbar 09/16/2015  . OAB (overactive bladder) 05/25/2012  . OSTEOPENIA 10/09/2009  . Cochlear implant in  place 12/30/2007  . PSVT 12/30/2007  . GERD 12/30/2007    Past Surgical History:  Procedure Laterality Date  . ABDOMINAL HYSTERECTOMY    . APPENDECTOMY    . CHOLECYSTECTOMY    . COCHLEAR IMPLANT Left Oct '14   Done at [facility] - great results  . EYE SURGERY    . POLYPECTOMY    . TUBAL LIGATION    . urethral dilatations    . VENTRICULAR ABLATION SURGERY      OB History   No obstetric history on file.      Home Medications    Prior to Admission medications   Medication Sig Start Date End Date Taking? Authorizing Provider  acetaminophen (TYLENOL) 650 MG CR tablet Take 650 mg by mouth every 8 (eight) hours as needed for pain.    [provider]  ALPRAZolam Duanne Moron) 0.5 MG tablet Take 1 tablet (0.5 mg total) by mouth at bedtime as needed for anxiety. 02/03/18   [name], MD  calcium-vitamin D (OSCAL WITH D) 250-125 MG-UNIT tablet Take 1 tablet by mouth daily.    [provider]  cetirizine (ZYRTEC) 5 MG tablet Take 5 mg by mouth daily.    [provider]  cholecalciferol (VITAMIN D) 1000 units tablet Take 2,000 Units by mouth daily.    [provider]  esomeprazole (NEXIUM) 20 MG capsule Take 20 mg by mouth daily  at 12 noon.    [provider]  fluticasone (FLONASE) 50 MCG/ACT nasal spray Place 2 sprays into both nostrils daily. 03/31/19   [name], MD  traMADol Veatrice Bourbon) 50 MG tablet TAKE 1/2 TO 1 TABLET BY MOUTH 2 TIMES DAILY 03/31/19   [name], MD    Family History Family History  Problem Relation Age of Onset  . Diabetes Mother   . Uterine cancer Mother   . Coronary artery disease Father        CABG x 5 in 1985  . Hypertension Father   . Colon cancer Neg Hx     Social History Social History   Tobacco Use  . Smoking status: Never Smoker  . Smokeless tobacco: Never Used  Substance Use Topics  . Alcohol use: No  . Drug use: No     Allergies   Sulfamethoxazole-trimethoprim    Review of Systems Review of Systems  Reason unable to perform ROS: See HPI as above.     Physical Exam Triage Vital Signs ED Triage Vitals  Enc Vitals Group     BP 05/24/19 1050 (!) 161/92     Pulse Rate 05/24/19 1050 79     Resp 05/24/19 1050 16     Temp 05/24/19 1050 98 F (36.7 C)     Temp Source 05/24/19 1050 Oral     SpO2 05/24/19 1050 96 %     Weight --      Height --      Head Circumference --      Peak Flow --      Pain Score 05/24/19 1055 2     Pain Loc --      Pain Edu? --      Excl. in [place]? --    No data found.  Updated Vital Signs BP (!) 161/92 (BP Location: Left Arm)   Pulse 79   Temp 98 F (36.7 C) (Oral)   Resp 16   SpO2 96%   Physical Exam Constitutional:      General: She is not in acute distress.    Appearance: Normal appearance. She is not ill-appearing, toxic-appearing or diaphoretic.  HENT:     Head: Normocephalic and atraumatic.     Mouth/Throat:     Mouth: Mucous membranes are moist.     Pharynx: Oropharynx is clear. Uvula midline.  Neck:     Musculoskeletal: Normal range of motion and neck supple.  Cardiovascular:     Rate and Rhythm: Normal rate and regular rhythm.     Heart sounds: Normal heart sounds. No murmur. No friction rub. No gallop.   Pulmonary:     Effort: Pulmonary effort is normal. No accessory muscle usage, prolonged expiration, respiratory distress or retractions.     Comments: Lungs clear to auscultation without adventitious lung sounds. Musculoskeletal:     Comments: Diffuse tenderness to palpation of thoracic back. No focal spinal tenderness. Full ROM of neck, shoulder, back, elbow. Strength normal and equal bilaterally. Normal grip strength. Sensation intact and equal bilaterally.   Neurological:     General: No focal deficit present.     Mental Status: She is alert and oriented to person, place, and time.      UC Treatments / Results  Labs (all labs ordered are listed, but only abnormal results are  displayed) Labs Reviewed - No data to display  EKG   Radiology No results found.  Procedures Procedures (including critical care time)  Medications Ordered in UC  Medications - No data to display  Initial Impression / Assessment and Plan / UC Course  I have reviewed the triage vital signs and the nursing notes.  Pertinent labs & imaging results that were available during my care of the patient were reviewed by me and considered in my medical decision making (see chart for details).    Will have patient take short course of NSAIDs, given age, will have patient use ibuprofen. Supplement with tylenol. Follow up with PCP if symptoms still not improving. Return precautions given. Patient expresses understanding and agrees to plan.  Final Clinical Impressions(s) / UC Diagnoses   Final diagnoses:  Chronic bilateral thoracic back pain    ED Prescriptions    None        Ok Edwards, PA-C 05/24/19 1507

## 2019-06-07 ENCOUNTER — Other Ambulatory Visit: Payer: Self-pay

## 2019-08-07 DIAGNOSIS — Z961 Presence of intraocular lens: Secondary | ICD-10-CM | POA: Diagnosis not present

## 2019-08-07 DIAGNOSIS — H02889 Meibomian gland dysfunction of unspecified eye, unspecified eyelid: Secondary | ICD-10-CM | POA: Diagnosis not present

## 2019-08-07 DIAGNOSIS — H353131 Nonexudative age-related macular degeneration, bilateral, early dry stage: Secondary | ICD-10-CM | POA: Diagnosis not present

## 2019-09-27 ENCOUNTER — Encounter: Payer: Self-pay | Admitting: Internal Medicine

## 2019-09-27 ENCOUNTER — Other Ambulatory Visit: Payer: Self-pay

## 2019-09-27 ENCOUNTER — Ambulatory Visit (INDEPENDENT_AMBULATORY_CARE_PROVIDER_SITE_OTHER): Payer: Medicare Other | Admitting: Internal Medicine

## 2019-09-27 ENCOUNTER — Ambulatory Visit (INDEPENDENT_AMBULATORY_CARE_PROVIDER_SITE_OTHER)
Admission: RE | Admit: 2019-09-27 | Discharge: 2019-09-27 | Disposition: A | Discharge status: Home / Self Care | Payer: Medicare Other | Source: Ambulatory Visit | Attending: Internal Medicine | Admitting: Internal Medicine

## 2019-09-27 ENCOUNTER — Other Ambulatory Visit (INDEPENDENT_AMBULATORY_CARE_PROVIDER_SITE_OTHER): Payer: Medicare Other

## 2019-09-27 VITALS — BP 118/80 | HR 84 | Temp 98.3°F | Ht 63.5 in | Wt 176.0 lb

## 2019-09-27 DIAGNOSIS — G8929 Other chronic pain: Secondary | ICD-10-CM

## 2019-09-27 DIAGNOSIS — J3089 Other allergic rhinitis: Secondary | ICD-10-CM

## 2019-09-27 DIAGNOSIS — M546 Pain in thoracic spine: Secondary | ICD-10-CM

## 2019-09-27 DIAGNOSIS — J0110 Acute frontal sinusitis, unspecified: Secondary | ICD-10-CM

## 2019-09-27 DIAGNOSIS — Z13220 Encounter for screening for lipoid disorders: Secondary | ICD-10-CM | POA: Diagnosis not present

## 2019-09-27 DIAGNOSIS — Z23 Encounter for immunization: Secondary | ICD-10-CM | POA: Diagnosis not present

## 2019-09-27 DIAGNOSIS — N3281 Overactive bladder: Secondary | ICD-10-CM

## 2019-09-27 DIAGNOSIS — L729 Follicular cyst of the skin and subcutaneous tissue, unspecified: Secondary | ICD-10-CM | POA: Diagnosis not present

## 2019-09-27 DIAGNOSIS — Z0000 Encounter for general adult medical examination without abnormal findings: Secondary | ICD-10-CM | POA: Diagnosis not present

## 2019-09-27 LAB — LIPID PANEL
Cholesterol: 180 mg/dL (ref 0–200)
HDL: 47.4 mg/dL (ref >39.00)
LDL Cholesterol: 110 mg/dL — ABNORMAL HIGH (ref 0–99)
NonHDL: 132.96
Total CHOL/HDL Ratio: 4
Triglycerides: 117 mg/dL (ref 0.0–149.0)
VLDL: 23.4 mg/dL (ref 0.0–40.0)

## 2019-09-27 LAB — COMPREHENSIVE METABOLIC PANEL
ALT: 9 U/L (ref 0–35)
AST: 22 U/L (ref 0–37)
Albumin: 3.9 g/dL (ref 3.5–5.2)
Alkaline Phosphatase: 71 U/L (ref 39–117)
BUN: 11 mg/dL (ref 6–23)
CO2: 29 mEq/L (ref 19–32)
Calcium: 9.5 mg/dL (ref 8.4–10.5)
Chloride: 105 mEq/L (ref 96–112)
Creatinine, Ser: 1.05 mg/dL (ref 0.40–1.20)
GFR: 60.72 mL/min (ref >60.00)
Glucose, Bld: 98 mg/dL (ref 70–99)
Potassium: 3.8 mEq/L (ref 3.5–5.1)
Sodium: 142 mEq/L (ref 135–145)
Total Bilirubin: 1.1 mg/dL (ref 0.2–1.2)
Total Protein: 7.3 g/dL (ref 6.0–8.3)

## 2019-09-27 LAB — CBC
HCT: 40.9 % (ref 36.0–46.0)
Hemoglobin: 13.4 g/dL (ref 12.0–15.0)
MCHC: 32.7 g/dL (ref 30.0–36.0)
MCV: 81.8 fl (ref 78.0–100.0)
Platelets: 207 10*3/uL (ref 150.0–400.0)
RBC: 5 Mil/uL (ref 3.87–5.11)
RDW: 13.8 % (ref 11.5–15.5)
WBC: 6.7 10*3/uL (ref 4.0–10.5)

## 2019-09-27 MED ORDER — AMOXICILLIN-POT CLAVULANATE 875-125 MG PO TABS: 1.0000 | ORAL_TABLET | Freq: Two times a day (BID) | ORAL | 0 refills | Status: DC

## 2019-09-27 NOTE — Progress Notes (Signed)
Subjective:   Patient ID: [facility], female    DOB: Feb 28, 1935, 84 y.o.   MRN: [id number]  HPI Here for medicare wellness, no new complaints. Please see A/P for status and treatment of chronic medical problems.   HPI #2: Here for new problem of sinus problems (worse in the last 2-3 weeks, sinus pressure in the frontal sinuses, denies fevers or chills, denies cough or SOB, taking flonase but has not been working lately) and follow up chronic pain (uses tylenol and tramadol rarely, denies fall or injury, denies significant worsening, still has some tramadol and does not need refill today, wants to make sure that her middle back has no problems as this is going on worse in 6 months) and allergies (taking flonase, having worse problems currently, denies fevers or chills, uses just seasonally but worse this year).  Diet: heart healthy Physical activity: sedentary Depression/mood screen: negative Hearing: cochlear implants, not Visual acuity: grossly normal, performs annual eye exam  ADLs: capable Fall risk: none Home safety: good Cognitive evaluation: intact to orientation, naming, recall and repetition EOL planning: adv directives discussed    Office Visit from 09/27/2019 in [facility]  PHQ-2 Total Score  0      I have personally reviewed and have noted 1. The patient's medical and social history - reviewed today no changes 2. Their use of alcohol, tobacco or illicit drugs 3. Their current medications and supplements 4. The patient's functional ability including ADL's, fall risks, home safety risks and hearing or visual impairment. 5. Diet and physical activities 6. Evidence for depression or mood disorders 7. Care team reviewed and updated  Patient Care Team: [name], MD as PCP - General (Internal Medicine) Past Medical History:  Diagnosis Date  . Allergy   . Arthritis   . Asthma   . Cataract   . Chronic allergic rhinitis    with asthmatic component  . Diverticulosis of colon (without mention of hemorrhage)   . GERD (gastroesophageal reflux disease)   . Hearing impairment   . Hiatal hernia   . History of bursitis    right shoulder  . History of colon polyps   . History of pneumonia   . History of PSVT (paroxysmal supraventricular tachycardia)   . Hormone replacement therapy (postmenopausal)   . IBS (irritable bowel syndrome)   . Peptic stricture of esophagus   . Watermelon stomach    Past Surgical History:  Procedure Laterality Date  . ABDOMINAL HYSTERECTOMY    . APPENDECTOMY    . CHOLECYSTECTOMY    . COCHLEAR IMPLANT Left Oct '14   Done at [facility] - great results  . EYE SURGERY    . POLYPECTOMY    . TUBAL LIGATION    . urethral dilatations    . VENTRICULAR ABLATION SURGERY     Family History  Problem Relation Age of Onset  . Diabetes Mother   . Uterine cancer Mother   . Coronary artery disease Father        CABG x 5 in 1985  . Hypertension Father   . Colon cancer Neg Hx     Review of Systems  Constitutional: Negative.   HENT: Positive for congestion, rhinorrhea, sinus pressure and sinus pain. Negative for postnasal drip, sneezing, sore throat, trouble swallowing and voice change.   Eyes: Negative.   Respiratory: Negative for cough, chest tightness and shortness of breath.   Cardiovascular: Negative for chest pain, palpitations and leg swelling.  Gastrointestinal:  Negative for abdominal distention, abdominal pain, constipation, diarrhea, nausea and vomiting.  Musculoskeletal: Positive for back pain.  Skin: Negative.   Neurological: Negative.   Psychiatric/Behavioral: Negative.     Objective:  Physical Exam Constitutional:      Appearance: She is well-developed.  HENT:     Head: Normocephalic and atraumatic.     Ears:     Comments: Frontal sinus pressure Neck:     Musculoskeletal: Normal range of motion.  Cardiovascular:     Rate and Rhythm: Normal rate and regular  rhythm.  Pulmonary:     Effort: Pulmonary effort is normal. No respiratory distress.     Breath sounds: Normal breath sounds. No wheezing or rales.  Abdominal:     General: Bowel sounds are normal. There is no distension.     Palpations: Abdomen is soft.     Tenderness: There is no abdominal tenderness. There is no rebound.  Musculoskeletal:        General: Tenderness present.     Comments: Tenderness midline thoracic spine  Skin:    General: Skin is warm and dry.  Neurological:     Mental Status: She is alert and oriented to person, place, and time.     Coordination: Coordination normal.     Vitals:   09/27/19 1040  BP: 118/80  Pulse: 84  Temp: 98.3 F (36.8 C)  TempSrc: Oral  SpO2: 98%  Weight: 176 lb (79.8 kg)  Height: 5' 3.5" (1.613 m)    Assessment & Plan:  Flu shot given at visit

## 2019-09-27 NOTE — Patient Instructions (Addendum)
We will check the labs today and do an x-ray of the back.   We have sent in augmentin to take 1 pill twice a day for 1 week to help the sinuses.    Health Maintenance, Female Adopting a healthy lifestyle and getting preventive care are important in promoting health and wellness. Ask your health care provider about:  The right schedule for you to have regular tests and exams.  Things you can do on your own to prevent diseases and keep yourself healthy. What should I know about diet, weight, and exercise? Eat a healthy diet   Eat a diet that includes plenty of vegetables, fruits, low-fat dairy products, and lean protein.  Do not eat a lot of foods that are high in solid fats, added sugars, or sodium. Maintain a healthy weight Body mass index (BMI) is used to identify weight problems. It estimates body fat based on height and weight. Your health care provider can help determine your BMI and help you achieve or maintain a healthy weight. Get regular exercise Get regular exercise. This is one of the most important things you can do for your health. Most adults should:  Exercise for at least 150 minutes each week. The exercise should increase your heart rate and make you sweat (moderate-intensity exercise).  Do strengthening exercises at least twice a week. This is in addition to the moderate-intensity exercise.  Spend less time sitting. Even light physical activity can be beneficial. Watch cholesterol and blood lipids Have your blood tested for lipids and cholesterol at 82 years of age, then have this test every 5 years. Have your cholesterol levels checked more often if:  Your lipid or cholesterol levels are high.  You are older than 82 years of age.  You are at high risk for heart disease. What should I know about cancer screening? Depending on your health history and family history, you may need to have cancer screening at various ages. This may include screening for:  Breast  cancer.  Cervical cancer.  Colorectal cancer.  Skin cancer.  Lung cancer. What should I know about heart disease, diabetes, and high blood pressure? Blood pressure and heart disease  High blood pressure causes heart disease and increases the risk of stroke. This is more likely to develop in people who have high blood pressure readings, are of African descent, or are overweight.  Have your blood pressure checked: ? Every 3-5 years if you are 54-57 years of age. ? Every year if you are 2 years old or older. Diabetes Have regular diabetes screenings. This checks your fasting blood sugar level. Have the screening done:  Once every three years after age 83 if you are at a normal weight and have a low risk for diabetes.  More often and at a younger age if you are overweight or have a high risk for diabetes. What should I know about preventing infection? Hepatitis B If you have a higher risk for hepatitis B, you should be screened for this virus. Talk with your health care provider to find out if you are at risk for hepatitis B infection. Hepatitis C Testing is recommended for:  Everyone born from [birth year] through 1963.  Anyone with known risk factors for hepatitis C. Sexually transmitted infections (STIs)  Get screened for STIs, including gonorrhea and chlamydia, if: ? You are sexually active and are younger than 82 years of age. ? You are older than 82 years of age and your health care provider tells you  that you are at risk for this type of infection. ? Your sexual activity has changed since you were last screened, and you are at increased risk for chlamydia or gonorrhea. Ask your health care provider if you are at risk.  Ask your health care provider about whether you are at high risk for HIV. Your health care provider may recommend a prescription medicine to help prevent HIV infection. If you choose to take medicine to prevent HIV, you should first get tested for HIV. You should  then be tested every 3 months for as long as you are taking the medicine. Pregnancy  If you are about to stop having your period (premenopausal) and you may become pregnant, seek counseling before you get pregnant.  Take 400 to 800 micrograms (mcg) of folic acid every day if you become pregnant.  Ask for birth control (contraception) if you want to prevent pregnancy. Osteoporosis and menopause Osteoporosis is a disease in which the bones lose minerals and strength with aging. This can result in bone fractures. If you are 19 years old or older, or if you are at risk for osteoporosis and fractures, ask your health care provider if you should:  Be screened for bone loss.  Take a calcium or vitamin D supplement to lower your risk of fractures.  Be given hormone replacement therapy (HRT) to treat symptoms of menopause. Follow these instructions at home: Lifestyle  Do not use any products that contain nicotine or tobacco, such as cigarettes, e-cigarettes, and chewing tobacco. If you need help quitting, ask your health care provider.  Do not use street drugs.  Do not share needles.  Ask your health care provider for help if you need support or information about quitting drugs. Alcohol use  Do not drink alcohol if: ? Your health care provider tells you not to drink. ? You are pregnant, may be pregnant, or are planning to become pregnant.  If you drink alcohol: ? Limit how much you use to 0-1 drink a day. ? Limit intake if you are breastfeeding.  Be aware of how much alcohol is in your drink. In the [place] one drink equals one 12 oz bottle of beer (355 mL), one 5 oz glass of wine (148 mL), or one 1 oz glass of hard liquor (44 mL). General instructions  Schedule regular health, dental, and eye exams.  Stay current with your vaccines.  Tell your health care provider if: ? You often feel depressed. ? You have ever been abused or do not feel safe at home. Summary  Adopting a  healthy lifestyle and getting preventive care are important in promoting health and wellness.  Follow your health care provider's instructions about healthy diet, exercising, and getting tested or screened for diseases.  Follow your health care provider's instructions on monitoring your cholesterol and blood pressure. This information is not intended to replace advice given to you by your health care provider. Make sure you discuss any questions you have with your health care provider. Document Released: 05/11/2011 Document Revised: 10/19/2018 Document Reviewed: 10/19/2018 Elsevier Patient Education  2020 [employer].

## 2019-09-28 DIAGNOSIS — L729 Follicular cyst of the skin and subcutaneous tissue, unspecified: Secondary | ICD-10-CM | POA: Insufficient documentation

## 2019-09-28 DIAGNOSIS — G8929 Other chronic pain: Secondary | ICD-10-CM | POA: Insufficient documentation

## 2019-09-28 DIAGNOSIS — J309 Allergic rhinitis, unspecified: Secondary | ICD-10-CM | POA: Insufficient documentation

## 2019-09-28 DIAGNOSIS — J019 Acute sinusitis, unspecified: Secondary | ICD-10-CM | POA: Insufficient documentation

## 2019-09-28 NOTE — Assessment & Plan Note (Signed)
Referral to dermatology for removal

## 2019-09-28 NOTE — Assessment & Plan Note (Signed)
Rx augmentin today and keep flonase.

## 2019-09-28 NOTE — Assessment & Plan Note (Signed)
Keep flonase and can add zyrtec as needed. Currently treating for acute sinus infection.

## 2019-09-28 NOTE — Assessment & Plan Note (Signed)
Checking x-ray thoracic spine to evaluate given new pain in the last 6 months. Using tylenol and tramadol as needed.

## 2019-09-28 NOTE — Assessment & Plan Note (Signed)
Flu shot up to date. Pneumonia completed. Shingrix counseled. Tetanus due 2029. Colonoscopy aged out. Mammogram aged out, pap smear aged out and dexa declines. Counseled about sun safety and mole surveillance. Counseled about the dangers of distracted driving. Given 10 year screening recommendations.

## 2019-10-02 ENCOUNTER — Telehealth: Payer: Self-pay

## 2019-10-02 NOTE — Telephone Encounter (Signed)
Copied from [place] [id number]. Topic: General - Other >> Oct 02, 2019 11:25 AM Yvette Rack wrote: Reason for CRM: Pt sister Peggie Claiborne Billings called with pt on the line and stated pt received a call in regards to her [employer] but it was not clear what was needed. Pt requests call back. >> Oct 02, 2019  1:09 PM Para Skeans A wrote: No really sure if this applies to you?

## 2019-10-02 NOTE — Telephone Encounter (Signed)
Called and informed that our office did not call about insurance we did call about labs and imaging though

## 2019-12-13 ENCOUNTER — Encounter: Payer: Self-pay | Admitting: Internal Medicine

## 2020-01-04 ENCOUNTER — Other Ambulatory Visit: Payer: Self-pay

## 2020-01-04 ENCOUNTER — Encounter: Payer: Self-pay | Admitting: Internal Medicine

## 2020-01-04 ENCOUNTER — Ambulatory Visit (INDEPENDENT_AMBULATORY_CARE_PROVIDER_SITE_OTHER): Payer: Medicare Other | Admitting: Internal Medicine

## 2020-01-04 VITALS — BP 156/98 | HR 77 | Temp 98.6°F | Ht 63.5 in | Wt 174.2 lb

## 2020-01-04 DIAGNOSIS — K219 Gastro-esophageal reflux disease without esophagitis: Secondary | ICD-10-CM

## 2020-01-04 DIAGNOSIS — K5900 Constipation, unspecified: Secondary | ICD-10-CM

## 2020-01-04 DIAGNOSIS — R1310 Dysphagia, unspecified: Secondary | ICD-10-CM | POA: Diagnosis not present

## 2020-01-04 MED ORDER — TRAMADOL HCL 50 MG PO TABS: ORAL_TABLET | ORAL | 1 refills | Status: DC

## 2020-01-04 NOTE — Progress Notes (Signed)
   Subjective:   Patient ID: Emily Bentley, female    DOB: February 10, 1935, 85 y.o.   MRN: [id number]  HPI The patient is an 85 YO female coming in for several GI concerns including GERD (taking nexium daily, feels like this is not helping enough lately, foods are getting stuck, some bloating, poor appetite) and dysphagia (prior esophageal dilation, has not seen GI in some time, sees Dr. Henrene Pastor, has hiatal hernia, solids get stuck and she needs to use a lot of liquids to help them go down, does not have to regurgitate food) and constipation (has to use enemas often, gets relief for 2 days, taking linzess daily but this does not help, uses milk of magnesia sometimes, denies nausea or vomiting, does have GERD symptoms, has pain and fullness in her stomach, still passing gas, last BM yesterday with enema, denies blood in stool).   Review of Systems  Constitutional: Positive for appetite change. Negative for activity change, fatigue and unexpected weight change.  HENT: Negative.   Eyes: Negative.   Respiratory: Negative for cough, chest tightness and shortness of breath.   Cardiovascular: Negative for chest pain, palpitations and leg swelling.  Gastrointestinal: Positive for abdominal distention, abdominal pain, constipation and nausea. Negative for diarrhea, rectal pain and vomiting.  Musculoskeletal: Negative.   Skin: Negative.   Neurological: Negative.   Psychiatric/Behavioral: Negative.     Objective:  Physical Exam Constitutional:      Appearance: She is well-developed.  HENT:     Head: Normocephalic and atraumatic.  Cardiovascular:     Rate and Rhythm: Normal rate and regular rhythm.  Pulmonary:     Effort: Pulmonary effort is normal. No respiratory distress.     Breath sounds: Normal breath sounds. No wheezing or rales.  Abdominal:     General: There is distension.     Palpations: Abdomen is soft.     Tenderness: There is abdominal tenderness. There is no rebound.     Comments: BS  decreased but present, mild tenderness upper abdomen without rebound or guarding, some mild distention compared to prior  Musculoskeletal:     Cervical back: Normal range of motion.  Skin:    General: Skin is warm and dry.  Neurological:     Mental Status: She is alert and oriented to person, place, and time.     Coordination: Coordination normal.     Vitals:   01/04/20 1046  BP: (!) 156/98  Pulse: 77  Temp: 98.6 F (37 C)  TempSrc: Oral  SpO2: 97%  Weight: 174 lb 3.2 oz (79 kg)  Height: 5' 3.5" (1.613 m)    This visit occurred during the SARS-CoV-2 public health emergency.  Safety protocols were in place, including screening questions prior to the visit, additional usage of staff PPE, and extensive cleaning of exam room while observing appropriate contact time as indicated for disinfecting solutions.   Assessment & Plan:

## 2020-01-04 NOTE — Assessment & Plan Note (Signed)
Increase nexium to BID for 1-2 weeks and then reassess. Asked her to return to GI for assessment given the new/progressive dysphagia.

## 2020-01-04 NOTE — Assessment & Plan Note (Signed)
Likely due to recurrence of esophageal stenosis with hard to swallow solids and no dysphagia with liquids. Asked her to return to GI for EGD.

## 2020-01-04 NOTE — Assessment & Plan Note (Signed)
Given sample of amitiza 8 to take BID for 1 week. Call back if helping. If not can increase to 24 BID. Will discontinue linzess as this is not helping. Continue otc as well. She likely has high stool burden and needs several days of emptying and then regular bowel habits. Counseled her about regimen at home. Needs to return to GI.

## 2020-01-04 NOTE — Patient Instructions (Addendum)
We are giving you a sample of amitiza to take 1 pill twice a day. This is about a week supply so call us and let us know if working.   We would like you to double the nexium to twice a day.  Please call your GI doctor and go back in as you likely need your esophagus stretched again.   Depending on how the bowels are doing we may need to take an over the counter medicine too for the bowels.

## 2020-01-16 ENCOUNTER — Telehealth: Payer: Self-pay

## 2020-01-16 MED ORDER — LUBIPROSTONE 24 MCG PO CAPS: 24.0000 ug | ORAL_CAPSULE | Freq: Two times a day (BID) | ORAL | 0 refills | Status: DC

## 2020-01-16 NOTE — Telephone Encounter (Signed)
New message    The patient was given a sample of laxative do not know the name ,Previous appt on 2.25.21   Need a refill sent in to [facility] on [address]

## 2020-01-16 NOTE — Telephone Encounter (Signed)
In the last OV note it stated that you gave the pt Amitiza to try. Please advise.

## 2020-01-16 NOTE — Telephone Encounter (Signed)
Sent in

## 2020-01-16 NOTE — Addendum Note (Signed)
Addended by: Pricilla Holm A on: 01/16/2020 11:31 AM   Modules accepted: Orders

## 2020-02-05 DIAGNOSIS — H903 Sensorineural hearing loss, bilateral: Secondary | ICD-10-CM | POA: Diagnosis not present

## 2020-02-05 DIAGNOSIS — Z45321 Encounter for adjustment and management of cochlear device: Secondary | ICD-10-CM | POA: Diagnosis not present

## 2020-02-07 ENCOUNTER — Encounter: Payer: Self-pay | Admitting: Internal Medicine

## 2020-02-07 ENCOUNTER — Ambulatory Visit (INDEPENDENT_AMBULATORY_CARE_PROVIDER_SITE_OTHER): Payer: Medicare Other | Admitting: Internal Medicine

## 2020-02-07 VITALS — BP 100/68 | HR 72 | Temp 97.8°F | Ht 63.5 in | Wt 172.0 lb

## 2020-02-07 DIAGNOSIS — K5900 Constipation, unspecified: Secondary | ICD-10-CM | POA: Diagnosis not present

## 2020-02-07 DIAGNOSIS — R1310 Dysphagia, unspecified: Secondary | ICD-10-CM

## 2020-02-07 DIAGNOSIS — K222 Esophageal obstruction: Secondary | ICD-10-CM | POA: Diagnosis not present

## 2020-02-07 DIAGNOSIS — K219 Gastro-esophageal reflux disease without esophagitis: Secondary | ICD-10-CM

## 2020-02-07 NOTE — Patient Instructions (Signed)
If you are age 83 or older, your body mass index should be between 23-30. Your Body mass index is 29.99 kg/m. If this is out of the aforementioned range listed, please consider follow up with your Primary Care Provider.  If you are age 55 or younger, your body mass index should be between 19-25. Your Body mass index is 29.99 kg/m. If this is out of the aformentioned range listed, please consider follow up with your Primary Care Provider.   You have been scheduled for an endoscopy. Please follow written instructions given to you at your visit today. If you use inhalers (even only as needed), please bring them with you on the day of your procedure.  You have been given samples of Linzess 290 mcg.

## 2020-02-07 NOTE — Progress Notes (Signed)
HISTORY OF PRESENT ILLNESS:  Emily Bentley is a 85 y.o. female with multiple medical problems as listed below.  She is accompanied today by her sister.  She presents with a chief complaints of progressive intermittent solid food dysphagia and persistent problematic chronic constipation.  I have not seen the patient since November 2017.  Problems at that time were GERD complicated by peptic stricture with recurrent dysphagia and known history of colon polyps with surveillance up-to-date.  See that dictation.  She last underwent upper endoscopy with esophageal dilation ([address]) September 29, 2016.  Problems with dysphagia at that time.  She tells me that this helped her symptoms significantly.  For her reflux she is maintained on daily PPI therapy in the form of Nexium 20 mg daily.  This controls classic reflux symptoms.  However, over the past 6 months she has had progressive intermittent solid food dysphagia.  Similar to the past.  No weight loss.  She feels that a repeat dilation would be helpful.  Review of outside laboratories from November 2020 finds unremarkable comprehensive metabolic panel and CBC.  Hemoglobin 13.4.  Her last surveillance colonoscopy performed October 2014 revealed moderate diverticulosis.  She has aged out of surveillance.  In terms of constipation.  She describes difficulties on a daily basis.  She tells me that Linzess 145 mcg at night may or may not help.  If she took an extra 1 in the morning it did seem to help.  She has been on other agents with variable results.  She does take Senokot and milk of magnesia which will result in bowel movements.  No bleeding.  She does take tramadol.  I informed her this can be constipating.  Patient has completed her Covid vaccination series last month.  REVIEW OF SYSTEMS:  All non-GI ROS negative unless otherwise stated in the HPI except for arthritis, hearing impairment, allergies  Past Medical History:  Diagnosis Date  . Allergy    . Arthritis   . Asthma   . Cataract   . Chronic allergic rhinitis    with asthmatic component  . Diverticulosis of colon (without mention of hemorrhage)   . GERD (gastroesophageal reflux disease)   . Hearing impairment   . Hiatal hernia   . History of bursitis    right shoulder  . History of cochlear implant    Cant have an MRI  . History of colon polyps   . History of pneumonia   . History of PSVT (paroxysmal supraventricular tachycardia)   . Hormone replacement therapy (postmenopausal)   . IBS (irritable bowel syndrome)   . Peptic stricture of esophagus   . Watermelon stomach     Past Surgical History:  Procedure Laterality Date  . ABDOMINAL HYSTERECTOMY    . APPENDECTOMY    . CHOLECYSTECTOMY    . COCHLEAR IMPLANT Left Oct '14   Done at [facility] - great results  . EYE SURGERY    . POLYPECTOMY    . TUBAL LIGATION    . urethral dilatations    . VENTRICULAR ABLATION SURGERY      Social History DEBRO HILT  reports that she has never smoked. She has never used smokeless tobacco. She reports that she does not drink alcohol or use drugs.  family history includes Coronary artery disease in her father; Diabetes in her mother; Hypertension in her father; Uterine cancer in her mother.  Allergies  Allergen Reactions  . Sulfamethoxazole-Trimethoprim  PHYSICAL EXAMINATION: Vital signs: BP 100/68   Pulse 72   Temp 97.8 F (36.6 C)   Ht 5' 3.5" (1.613 m)   Wt 172 lb (78 kg)   BMI 29.99 kg/m   Constitutional: generally well-appearing, no acute distress Psychiatric: alert and oriented x3, cooperative Eyes: extraocular movements intact, anicteric, conjunctiva pink Mouth: oral pharynx moist, no lesions Neck: supple no lymphadenopathy Cardiovascular: heart regular rate and rhythm, no murmur Lungs: clear to auscultation bilaterally Abdomen: soft, nontender, nondistended, no obvious ascites, no peritoneal signs, normal bowel sounds, no  organomegaly Rectal: Omitted Extremities: no clubbing, cyanosis, or lower extremity edema bilaterally Skin: no lesions on visible extremities Neuro: No focal deficits.  Cranial nerves intact  ASSESSMENT:  1.  GERD complicated by peptic stricture.  Now with recurrent dysphagia likely secondary to recurrent peptic stricture. 2.  Known history of peptic stricture requiring esophageal dilation. 3.  Chronic constipation.  Ongoing. 4.  Surveillance colonoscopy up-to-date.  Aged out of surveillance   PLAN:  1.  Reflux precautions 2.  Continue Nexium 20 mg daily.  Medication risks reviewed 3.  Given samples of Linzess 290 mcg daily.  May prescribe if helpful.  Medication risks reviewed 4.  Schedule upper endoscopy with esophageal dilation.  The patient is higher than baseline risk given her age and comorbidities.The nature of the procedure, as well as the risks, benefits, and alternatives were carefully and thoroughly reviewed with the patient. Ample time for discussion and questions allowed. The patient understood, was satisfied, and agreed to proceed. 5.  Continue rescue agents as needed for constipation

## 2020-02-07 NOTE — Progress Notes (Signed)
Per Dr Henrene Pastor, pt received her vaccine series and completed in February, no need for covid test prior to procedure on 02/12/20.

## 2020-02-12 ENCOUNTER — Other Ambulatory Visit: Payer: Self-pay

## 2020-02-12 ENCOUNTER — Ambulatory Visit (AMBULATORY_SURGERY_CENTER): Payer: Medicare Other | Admitting: Internal Medicine

## 2020-02-12 ENCOUNTER — Encounter: Payer: Self-pay | Admitting: Internal Medicine

## 2020-02-12 VITALS — BP 118/74 | HR 59 | Temp 97.1°F | Resp 14 | Ht 63.0 in | Wt 172.0 lb

## 2020-02-12 DIAGNOSIS — R0789 Other chest pain: Secondary | ICD-10-CM | POA: Diagnosis not present

## 2020-02-12 DIAGNOSIS — K219 Gastro-esophageal reflux disease without esophagitis: Secondary | ICD-10-CM | POA: Diagnosis not present

## 2020-02-12 DIAGNOSIS — R1310 Dysphagia, unspecified: Secondary | ICD-10-CM | POA: Diagnosis not present

## 2020-02-12 DIAGNOSIS — K222 Esophageal obstruction: Secondary | ICD-10-CM

## 2020-02-12 MED ORDER — SODIUM CHLORIDE 0.9 % IV SOLN: 500.0000 mL | Freq: Once | INTRAVENOUS | Status: DC

## 2020-02-12 NOTE — Op Note (Signed)
[place] Patient Name: Emily Bentley Procedure Date: 02/12/2020 10:17 AM MRN: [id number] Endoscopist: Docia Chuck. Henrene Pastor , MD Age: 85 Referring MD:  Date of Birth: September 02, 1935 Gender: Female Account #: [id number] Procedure:                Upper GI endoscopy with [facility] dilation of the                            esophagus. [address] Indications:              Dysphagia Medicines:                Monitored Anesthesia Care Procedure:                Pre-Anesthesia Assessment:                           - Prior to the procedure, a History and Physical                            was performed, and patient medications and                            allergies were reviewed. The patient's tolerance of                            previous anesthesia was also reviewed. The risks                            and benefits of the procedure and the sedation                            options and risks were discussed with the patient.                            All questions were answered, and informed consent                            was obtained. Prior Anticoagulants: The patient has                            taken no previous anticoagulant or antiplatelet                            agents. ASA Grade Assessment: II - A patient with                            mild systemic disease. After reviewing the risks                            and benefits, the patient was deemed in                            satisfactory condition to undergo the procedure.  After obtaining informed consent, the endoscope was                            passed under direct vision. Throughout the                            procedure, the patient's blood pressure, pulse, and                            oxygen saturations were monitored continuously. The                            Endoscope was introduced through the mouth, and                            advanced to the second part of duodenum. The  upper                            GI endoscopy was accomplished without difficulty.                            The patient tolerated the procedure well. Scope In: Scope Out: Findings:                 One benign-appearing, intrinsic moderate stenosis                            was found 35 cm from the incisors. This stenosis                            measured 1.5 cm (inner diameter). The scope was                            withdrawn. Dilation was performed with a Maloney                            dilator with no resistance at 42 Fr.                           The exam of the esophagus was otherwise normal.                           The stomach was normal with moderate hiatal hernia                            noted.                           The examined duodenum was normal.                           The cardia and gastric fundus were normal on                            retroflexion. Complications:  No immediate complications. Estimated Blood Loss:     Estimated blood loss: none. Impression:               - Benign-appearing esophageal stenosis. Dilated.                           - Normal stomach.                           - Normal examined duodenum.                           - No specimens collected. Recommendation:           - Patient has a contact number available for                            emergencies. The signs and symptoms of potential                            delayed complications were discussed with the                            patient. Return to normal activities tomorrow.                            Written discharge instructions were provided to the                            patient.                           - Post dilation diet.                           - Continue present medications.                           - Office follow-up with Dr. Henrene Pastor in 4 to 6 weeks Docia Chuck. Henrene Pastor, MD 02/12/2020 10:46:43 AM This report has been signed electronically.

## 2020-02-12 NOTE — Progress Notes (Signed)
Pt's states no medical or surgical changes since previsit or office visit. 

## 2020-02-12 NOTE — Progress Notes (Signed)
Called to room to assist during endoscopic procedure.  Patient ID and intended procedure confirmed with present staff. Received instructions for my participation in the procedure from the performing physician.  

## 2020-02-12 NOTE — Patient Instructions (Signed)
Handouts given for Post-Dilation diet, Hiatal hernia and esophageal stricture.  YOU HAD AN ENDOSCOPIC PROCEDURE TODAY AT [facility]:   Refer to the procedure report that was given to you for any specific questions about what was found during the examination.  If the procedure report does not answer your questions, please call your gastroenterologist to clarify.  If you requested that your care partner not be given the details of your procedure findings, then the procedure report has been included in a sealed envelope for you to review at your convenience later.  YOU SHOULD EXPECT: Some feelings of bloating in the abdomen. Passage of more gas than usual.  Walking can help get rid of the air that was put into your GI tract during the procedure and reduce the bloating. If you had a lower endoscopy (such as a colonoscopy or flexible sigmoidoscopy) you may notice spotting of blood in your stool or on the toilet paper. If you underwent a bowel prep for your procedure, you may not have a normal bowel movement for a few days.  Please Note:  You might notice some irritation and congestion in your nose or some drainage.  This is from the oxygen used during your procedure.  There is no need for concern and it should clear up in a day or so.  SYMPTOMS TO REPORT IMMEDIATELY:   Following upper endoscopy (EGD)  Vomiting of blood or coffee ground material  New chest pain or pain under the shoulder blades  Painful or persistently difficult swallowing  New shortness of breath  Fever of 100F or higher  Black, tarry-looking stools  For urgent or emergent issues, a gastroenterologist can be reached at any hour by calling [phone]. Do not use MyChart messaging for urgent concerns.    DIET:  We do recommend a small meal at first, but then you may proceed to your regular diet.  Drink plenty of fluids but you should avoid alcoholic beverages for 24 hours.  ACTIVITY:  You should plan to take  it easy for the rest of today and you should NOT DRIVE or use heavy machinery until tomorrow (because of the sedation medicines used during the test).    FOLLOW UP: Our staff will call the number listed on your records 48-72 hours following your procedure to check on you and address any questions or concerns that you may have regarding the information given to you following your procedure. If we do not reach you, we will leave a message.  We will attempt to reach you two times.  During this call, we will ask if you have developed any symptoms of COVID 19. If you develop any symptoms (ie: fever, flu-like symptoms, shortness of breath, cough etc.) before then, please call [phone].  If you test positive for Covid 19 in the 2 weeks post procedure, please call and report this information to [place].    If any biopsies were taken you will be contacted by phone or by letter within the next 1-3 weeks.  Please call us at [phone] if you have not heard about the biopsies in 3 weeks.    SIGNATURES/CONFIDENTIALITY: You and/or your care partner have signed paperwork which will be entered into your electronic medical record.  These signatures attest to the fact that that the information above on your After Visit Summary has been reviewed and is understood.  Full responsibility of the confidentiality of this discharge information lies with you and/or your care-partner.

## 2020-02-12 NOTE — Progress Notes (Signed)
Report to PACU, RN, vss, BBS= Clear.  

## 2020-02-14 ENCOUNTER — Telehealth: Payer: Self-pay

## 2020-02-14 NOTE — Telephone Encounter (Signed)
First post procedure follow up call, no answer 

## 2020-02-14 NOTE — Telephone Encounter (Signed)
  Follow up Call-  Call back number 02/12/2020  Post procedure Call Back phone  # Peggy her [phone]  Permission to leave phone message Yes  Some recent data might be hidden     Patient questions:  Do you have a fever, pain , or abdominal swelling? No. Pain Score  0 *  Have you tolerated food without any problems? Yes.    Have you been able to return to your normal activities? Yes.    Do you have any questions about your discharge instructions: Diet   No. Medications  No. Follow up visit  No.  Do you have questions or concerns about your Care? No.  Actions: * If pain score is 4 or above: No action needed, pain <4. 1. Have you developed a fever since your procedure? no  2.   Have you had an respiratory symptoms (SOB or cough) since your procedure? no  3.   Have you tested positive for COVID 19 since your procedure no  4.   Have you had any family members/close contacts diagnosed with the COVID 19 since your procedure?  no   If yes to any of these questions please route to Joylene John, RN and Erenest Rasher, RN

## 2020-02-26 DIAGNOSIS — L7211 Pilar cyst: Secondary | ICD-10-CM | POA: Diagnosis not present

## 2020-03-01 DIAGNOSIS — H353131 Nonexudative age-related macular degeneration, bilateral, early dry stage: Secondary | ICD-10-CM | POA: Diagnosis not present

## 2020-03-14 DIAGNOSIS — Z1231 Encounter for screening mammogram for malignant neoplasm of breast: Secondary | ICD-10-CM | POA: Diagnosis not present

## 2020-03-19 ENCOUNTER — Ambulatory Visit (INDEPENDENT_AMBULATORY_CARE_PROVIDER_SITE_OTHER): Payer: Medicare Other | Admitting: Internal Medicine

## 2020-03-19 ENCOUNTER — Encounter: Payer: Self-pay | Admitting: Internal Medicine

## 2020-03-19 VITALS — BP 120/70 | HR 72 | Temp 98.4°F | Ht 63.0 in | Wt 169.0 lb

## 2020-03-19 DIAGNOSIS — K219 Gastro-esophageal reflux disease without esophagitis: Secondary | ICD-10-CM

## 2020-03-19 DIAGNOSIS — K222 Esophageal obstruction: Secondary | ICD-10-CM

## 2020-03-19 DIAGNOSIS — K5900 Constipation, unspecified: Secondary | ICD-10-CM | POA: Diagnosis not present

## 2020-03-19 NOTE — Patient Instructions (Signed)
You have been given some samples of Linzess 290 mcg.  If this works, call us back and I will send in a prescription

## 2020-03-19 NOTE — Progress Notes (Signed)
HISTORY OF PRESENT ILLNESS:  Emily Bentley is a 85 y.o. female who was seen in the office February 07, 2020 regarding problems with constipation and recurrent dysphagia.  See that dictation for details.  She has a known history of GERD complicated by peptic stricture as well as chronic functional constipation.  She has aged out of surveillance colonoscopy.  For her GERD she continues on Nexium 20 mg daily with control of classic symptoms.  She did undergo upper endoscopy with esophageal dilation (54 [place] Mal[place] dilator) February 12, 2020.  We placed her on Linzess 290 mcg for constipation.  She presents today for follow-up.  She is pleased to report that her dysphagia has resolved.  With Linzess 290 mcg she achieves good results.  Linzess 145 mcg was not as effective.  No appreciable medication side effects.  No new GI complaints  REVIEW OF SYSTEMS:  All non-GI ROS negative unless otherwise stated in the HPI except for back pain  Past Medical History:  Diagnosis Date  . Allergy   . Arthritis   . Asthma   . Cataract   . Chronic allergic rhinitis    with asthmatic component  . Diverticulosis of colon (without mention of hemorrhage)   . GERD (gastroesophageal reflux disease)   . Hearing impairment   . Hiatal hernia   . History of bursitis    right shoulder  . History of cochlear implant    Cant have an MRI  . History of colon polyps   . History of pneumonia   . History of PSVT (paroxysmal supraventricular tachycardia)   . Hormone replacement therapy (postmenopausal)   . IBS (irritable bowel syndrome)   . Peptic stricture of esophagus   . Watermelon stomach     Past Surgical History:  Procedure Laterality Date  . ABDOMINAL HYSTERECTOMY    . APPENDECTOMY    . CHOLECYSTECTOMY    . COCHLEAR IMPLANT Left Oct '14   Done at [facility] - great results  . ESOPHAGOGASTRODUODENOSCOPY    . EYE SURGERY    . POLYPECTOMY    . TUBAL LIGATION    . urethral dilatations    . VENTRICULAR  ABLATION SURGERY      Social History Emily Bentley  reports that she has never smoked. She has never used smokeless tobacco. She reports that she does not drink alcohol or use drugs.  family history includes Coronary artery disease in her father; Diabetes in her mother; Hypertension in her father; Uterine cancer in her mother.  Allergies  Allergen Reactions  . Sulfamethoxazole-Trimethoprim        PHYSICAL EXAMINATION: Vital signs: BP 120/70   Pulse 72   Temp 98.4 F (36.9 C)   Ht 5\' 3"  (1.6 m)   Wt 169 lb (76.7 kg)   BMI 29.94 kg/m   Constitutional: generally well-appearing, no acute distress Psychiatric: alert and oriented x3, cooperative Eyes: Anicteric Mouth: Mask Abdomen: Not reexamined Skin: no lesions on visible extremities Neuro: No focal deficits.   ASSESSMENT:  1.  GERD complicated by peptic stricture.  Asymptomatic post dilation on PPI 2.  Chronic functional constipation.  Improved with Linzess 290 mcg 3.  Colonoscopy 2014 with moderate diverticulosis   PLAN:  1.  Reflux precautions 2.  Continue Nexium 20 mg daily.  Medication risks reviewed 3.  Contact the office for recurrent dysphagia 4.  Continue Linzess 290 mcg on demand for constipation.  Additional samples given.  Medication risks reviewed 5.  Routine GI follow-up 1  year.  Sooner if needed

## 2020-05-01 ENCOUNTER — Other Ambulatory Visit: Payer: Self-pay | Admitting: Internal Medicine

## 2020-05-02 NOTE — Telephone Encounter (Signed)
04/03/2020 Tramadol Hcl 50 Mg Tablet 14#  Last ov 01/04/20 Next ov n/s

## 2020-05-07 ENCOUNTER — Other Ambulatory Visit: Payer: Self-pay

## 2020-05-07 ENCOUNTER — Inpatient Hospital Stay (HOSPITAL_COMMUNITY)
Admission: EM | Admit: 2020-05-07 | Discharge: 2020-05-10 | DRG: 389 | Disposition: A | Discharge status: Home / Self Care | Payer: Medicare Other | Attending: Internal Medicine | Admitting: Internal Medicine

## 2020-05-07 ENCOUNTER — Encounter (HOSPITAL_COMMUNITY): Payer: Self-pay | Admitting: Emergency Medicine

## 2020-05-07 ENCOUNTER — Emergency Department (HOSPITAL_COMMUNITY): Payer: Medicare Other

## 2020-05-07 ENCOUNTER — Inpatient Hospital Stay (HOSPITAL_COMMUNITY): Payer: Medicare Other

## 2020-05-07 DIAGNOSIS — Z03818 Encounter for observation for suspected exposure to other biological agents ruled out: Secondary | ICD-10-CM | POA: Diagnosis not present

## 2020-05-07 DIAGNOSIS — Z20822 Contact with and (suspected) exposure to covid-19: Secondary | ICD-10-CM | POA: Diagnosis present

## 2020-05-07 DIAGNOSIS — Z9621 Cochlear implant status: Secondary | ICD-10-CM | POA: Diagnosis present

## 2020-05-07 DIAGNOSIS — M47816 Spondylosis without myelopathy or radiculopathy, lumbar region: Secondary | ICD-10-CM | POA: Diagnosis not present

## 2020-05-07 DIAGNOSIS — K56609 Unspecified intestinal obstruction, unspecified as to partial versus complete obstruction: Secondary | ICD-10-CM | POA: Diagnosis not present

## 2020-05-07 DIAGNOSIS — K219 Gastro-esophageal reflux disease without esophagitis: Secondary | ICD-10-CM | POA: Diagnosis present

## 2020-05-07 DIAGNOSIS — K589 Irritable bowel syndrome without diarrhea: Secondary | ICD-10-CM | POA: Diagnosis present

## 2020-05-07 DIAGNOSIS — K6389 Other specified diseases of intestine: Secondary | ICD-10-CM | POA: Diagnosis not present

## 2020-05-07 DIAGNOSIS — Z4682 Encounter for fitting and adjustment of non-vascular catheter: Secondary | ICD-10-CM | POA: Diagnosis not present

## 2020-05-07 DIAGNOSIS — Z8711 Personal history of peptic ulcer disease: Secondary | ICD-10-CM | POA: Diagnosis not present

## 2020-05-07 DIAGNOSIS — Z0189 Encounter for other specified special examinations: Secondary | ICD-10-CM

## 2020-05-07 DIAGNOSIS — K5730 Diverticulosis of large intestine without perforation or abscess without bleeding: Secondary | ICD-10-CM | POA: Diagnosis present

## 2020-05-07 DIAGNOSIS — Z79899 Other long term (current) drug therapy: Secondary | ICD-10-CM | POA: Diagnosis not present

## 2020-05-07 DIAGNOSIS — K565 Intestinal adhesions [bands], unspecified as to partial versus complete obstruction: Principal | ICD-10-CM | POA: Diagnosis present

## 2020-05-07 DIAGNOSIS — F419 Anxiety disorder, unspecified: Secondary | ICD-10-CM | POA: Diagnosis present

## 2020-05-07 DIAGNOSIS — E860 Dehydration: Secondary | ICD-10-CM | POA: Diagnosis present

## 2020-05-07 DIAGNOSIS — Z452 Encounter for adjustment and management of vascular access device: Secondary | ICD-10-CM | POA: Diagnosis not present

## 2020-05-07 DIAGNOSIS — N179 Acute kidney failure, unspecified: Secondary | ICD-10-CM | POA: Diagnosis present

## 2020-05-07 DIAGNOSIS — Z881 Allergy status to other antibiotic agents status: Secondary | ICD-10-CM | POA: Diagnosis not present

## 2020-05-07 DIAGNOSIS — N182 Chronic kidney disease, stage 2 (mild): Secondary | ICD-10-CM | POA: Diagnosis present

## 2020-05-07 DIAGNOSIS — M8580 Other specified disorders of bone density and structure, unspecified site: Secondary | ICD-10-CM | POA: Diagnosis present

## 2020-05-07 LAB — URINALYSIS, ROUTINE W REFLEX MICROSCOPIC
Bilirubin Urine: NEGATIVE
Glucose, UA: NEGATIVE mg/dL
Ketones, ur: NEGATIVE mg/dL
Nitrite: NEGATIVE
Protein, ur: NEGATIVE mg/dL
Specific Gravity, Urine: 1.008 (ref 1.005–1.030)
pH: 9 — ABNORMAL HIGH (ref 5.0–8.0)

## 2020-05-07 LAB — COMPREHENSIVE METABOLIC PANEL
ALT: 15 U/L (ref 0–44)
AST: 36 U/L (ref 15–41)
Albumin: 4.3 g/dL (ref 3.5–5.0)
Alkaline Phosphatase: 73 U/L (ref 38–126)
Anion gap: 11 (ref 5–15)
BUN: 16 mg/dL (ref 8–23)
CO2: 24 mmol/L (ref 22–32)
Calcium: 9.6 mg/dL (ref 8.9–10.3)
Chloride: 106 mmol/L (ref 98–111)
Creatinine, Ser: 1.23 mg/dL — ABNORMAL HIGH (ref 0.44–1.00)
GFR calc Af Amer: 47 mL/min — ABNORMAL LOW (ref >60)
GFR calc non Af Amer: 41 mL/min — ABNORMAL LOW (ref >60)
Glucose, Bld: 110 mg/dL — ABNORMAL HIGH (ref 70–99)
Potassium: 4.5 mmol/L (ref 3.5–5.1)
Sodium: 141 mmol/L (ref 135–145)
Total Bilirubin: 1.5 mg/dL — ABNORMAL HIGH (ref 0.3–1.2)
Total Protein: 7.9 g/dL (ref 6.5–8.1)

## 2020-05-07 LAB — CBC
HCT: 42.4 % (ref 36.0–46.0)
Hemoglobin: 13.9 g/dL (ref 12.0–15.0)
MCH: 26.8 pg (ref 26.0–34.0)
MCHC: 32.8 g/dL (ref 30.0–36.0)
MCV: 81.7 fL (ref 80.0–100.0)
Platelets: 214 10*3/uL (ref 150–400)
RBC: 5.19 MIL/uL — ABNORMAL HIGH (ref 3.87–5.11)
RDW: 14.6 % (ref 11.5–15.5)
WBC: 11 10*3/uL — ABNORMAL HIGH (ref 4.0–10.5)
nRBC: 0 % (ref 0.0–0.2)

## 2020-05-07 LAB — LIPASE, BLOOD: Lipase: 27 U/L (ref 11–51)

## 2020-05-07 MED ORDER — SODIUM CHLORIDE (PF) 0.9 % IJ SOLN
INTRAMUSCULAR | Status: AC
  Filled 2020-05-07: qty 50

## 2020-05-07 MED ORDER — ONDANSETRON HCL 4 MG PO TABS: 4.0000 mg | ORAL_TABLET | Freq: Four times a day (QID) | ORAL | Status: DC | PRN

## 2020-05-07 MED ORDER — ONDANSETRON HCL 4 MG/2ML IJ SOLN: 4.0000 mg | Freq: Four times a day (QID) | INTRAMUSCULAR | Status: DC | PRN

## 2020-05-07 MED ORDER — LIDOCAINE HCL URETHRAL/MUCOSAL 2 % EX GEL
1.0000 "application " | Freq: Once | CUTANEOUS | Status: AC
  Administered 2020-05-07: 1 via TOPICAL
  Filled 2020-05-07: qty 1

## 2020-05-07 MED ORDER — ONDANSETRON HCL 4 MG/2ML IJ SOLN
4.0000 mg | Freq: Once | INTRAMUSCULAR | Status: AC | PRN
  Administered 2020-05-07: 4 mg via INTRAVENOUS
  Filled 2020-05-07: qty 2

## 2020-05-07 MED ORDER — DEXTROSE-NACL 5-0.9 % IV SOLN: INTRAVENOUS | Status: DC

## 2020-05-07 MED ORDER — MORPHINE SULFATE (PF) 2 MG/ML IV SOLN: 1.0000 mg | INTRAVENOUS | Status: DC | PRN

## 2020-05-07 MED ORDER — SODIUM CHLORIDE 0.9 % IV BOLUS
1000.0000 mL | Freq: Once | INTRAVENOUS | Status: AC
  Administered 2020-05-07: 1000 mL via INTRAVENOUS

## 2020-05-07 MED ORDER — SODIUM CHLORIDE 0.9% FLUSH: 3.0000 mL | Freq: Once | INTRAVENOUS | Status: DC

## 2020-05-07 MED ORDER — IOHEXOL 300 MG/ML  SOLN
100.0000 mL | Freq: Once | INTRAMUSCULAR | Status: AC | PRN
  Administered 2020-05-07: 100 mL via INTRAVENOUS

## 2020-05-07 MED ORDER — MORPHINE SULFATE (PF) 4 MG/ML IV SOLN
4.0000 mg | Freq: Once | INTRAVENOUS | Status: AC
  Administered 2020-05-07: 4 mg via INTRAVENOUS
  Filled 2020-05-07: qty 1

## 2020-05-07 NOTE — ED Triage Notes (Signed)
Patient complaining of upper abdominal pain. Patient has hx of constipation. Patient states she has started throwing up and no pain relief.

## 2020-05-07 NOTE — H&P (Signed)
History and Physical    [name]:[id number] DOB: [birth year] DOA: 05/07/2020  PCP: [name], MD  Patient coming from: Home.  Chief Complaint: Abdominal pain.  HPI: Emily Bentley is a 85 y.o. female with history of hearing impairment, GERD with esophageal gnosis requiring dilation in April 2021 on PPI with history of chronic constipation has been experiencing abdominal pain for the last 2 days initially started in the lower abdomen then became more diffuse.  Denies nausea vomiting.  Last bowel movement was yesterday after patient took some magnesium citrate.  Given the worsening pain patient presents to the ER.  ED Course: In the ER patient had a CT abdomen pelvis which shows features consistent with small bowel obstruction with transition point likely due to adhesions.  ER physician discussed with on-call general surgeon Dr. Ninfa Linden who advised NG tube and will be seeing patient in consult.  Covid test was negative.  Patient's labs are significant for creatinine around 1.2 which is increased from 0.05 WBC 11.  Review of Systems: As per HPI, rest all negative.   Past Medical History:  Diagnosis Date  . Allergy   . Arthritis   . Asthma   . Cataract   . Chronic allergic rhinitis    with asthmatic component  . Diverticulosis of colon (without mention of hemorrhage)   . GERD (gastroesophageal reflux disease)   . Hearing impairment   . Hiatal hernia   . History of bursitis    right shoulder  . History of cochlear implant    Cant have an MRI  . History of colon polyps   . History of pneumonia   . History of PSVT (paroxysmal supraventricular tachycardia)   . Hormone replacement therapy (postmenopausal)   . IBS (irritable bowel syndrome)   . Peptic stricture of esophagus   . Watermelon stomach     Past Surgical History:  Procedure Laterality Date  . ABDOMINAL HYSTERECTOMY    . APPENDECTOMY    . CHOLECYSTECTOMY    . COCHLEAR IMPLANT Left Oct '14    Done at [facility] - great results  . ESOPHAGOGASTRODUODENOSCOPY    . EYE SURGERY    . POLYPECTOMY    . TUBAL LIGATION    . urethral dilatations    . VENTRICULAR ABLATION SURGERY       reports that she has never smoked. She has never used smokeless tobacco. She reports that she does not drink alcohol and does not use drugs.  Allergies  Allergen Reactions  . Sulfamethoxazole-Trimethoprim     Family History  Problem Relation Age of Onset  . Diabetes Mother   . Uterine cancer Mother   . Coronary artery disease Father        CABG x 5 in 1985  . Hypertension Father   . Colon cancer Neg Hx     Prior to Admission medications   Medication Sig Start Date End Date Taking? Authorizing Provider  acetaminophen (TYLENOL) 650 MG CR tablet Take 650 mg by mouth every 8 (eight) hours as needed for pain.   Yes [provider]  ALPRAZolam Duanne Moron) 0.5 MG tablet Take 1 tablet (0.5 mg total) by mouth at bedtime as needed for anxiety. 02/03/18  Yes [name], MD  calcium-vitamin D (OSCAL WITH D) 250-125 MG-UNIT tablet Take 1 tablet by mouth daily.   Yes [provider]  cetirizine (ZYRTEC) 5 MG tablet Take 5 mg by mouth daily.   Yes [provider]  cholecalciferol (  VITAMIN D) 1000 units tablet Take 2,000 Units by mouth daily.   Yes [provider]  esomeprazole (NEXIUM) 20 MG capsule Take 20 mg by mouth daily at 12 noon.   Yes [provider]  linaclotide (LINZESS) 145 MCG CAPS capsule Take 145 mcg by mouth daily as needed (cramping).    Yes [provider]  magnesium hydroxide (MILK OF MAGNESIA) 400 MG/5ML suspension Take 30 mLs by mouth daily as needed for mild constipation.   Yes [provider]  Sennosides (SENOKOT EXTRA STRENGTH) 17.2 MG TABS Take 1 tablet by mouth daily as needed (constipation).    Yes [provider]  traMADol (ULTRAM) 50 MG tablet TAKE 1/2 TO 1 TABLET BY MOUTH TWICE DAILY Patient taking  differently: Take 50 mg by mouth every 12 (twelve) hours as needed for moderate pain.  05/02/20  Yes [name], MD    Physical Exam: Constitutional: Moderately built and nourished. Vitals:   05/07/20 1952 05/07/20 2055 05/07/20 2234 05/07/20 2341  BP: 130/81 (!) 142/88 (!) 130/98 (!) 151/85  Pulse: 81 84 86 79  Resp: (!) 22 (!) 22 17 16   Temp: 99.3 F (37.4 C)   98.8 F (37.1 C)  TempSrc: Oral   Oral  SpO2: 100% 100% 100% 99%  Weight: 78 kg     Height: 5' 3.5" (1.613 m)      Eyes: Anicteric no pallor. ENMT: No discharge from the ears eyes nose or mouth. Neck: No mass felt.  No neck rigidity. Respiratory: No rhonchi or crepitations. Cardiovascular: S1-S2 heard. Abdomen: Soft nontender bowel sounds not appreciated. Musculoskeletal: No edema. Skin: No rash. Neurologic: Alert awake oriented to time place and person.  Moves all extremities. Psychiatric: Appears normal.  Normal affect.   Labs on Admission: I have personally reviewed following labs and imaging studies  CBC: Recent Labs  Lab 05/07/20 2022  WBC 11.0*  HGB 13.9  HCT 42.4  MCV 81.7  PLT 470   Basic Metabolic Panel: Recent Labs  Lab 05/07/20 2022  NA 141  K 4.5  CL 106  CO2 24  GLUCOSE 110*  BUN 16  CREATININE 1.23*  CALCIUM 9.6   GFR: Estimated Creatinine Clearance: 35.3 mL/min (A) (by C-G formula based on SCr of 1.23 mg/dL (H)). Liver Function Tests: Recent Labs  Lab 05/07/20 2022  AST 36  ALT 15  ALKPHOS 73  BILITOT 1.5*  PROT 7.9  ALBUMIN 4.3   Recent Labs  Lab 05/07/20 2022  LIPASE 27   No results for input(s): AMMONIA in the last 168 hours. Coagulation Profile: No results for input(s): INR, PROTIME in the last 168 hours. Cardiac Enzymes: No results for input(s): CKTOTAL, CKMB, CKMBINDEX, TROPONINI in the last 168 hours. BNP (last 3 results) No results for input(s): PROBNP in the last 8760 hours. HbA1C: No results for input(s): HGBA1C in the last 72 hours. CBG: No  results for input(s): GLUCAP in the last 168 hours. Lipid Profile: No results for input(s): CHOL, HDL, LDLCALC, TRIG, CHOLHDL, LDLDIRECT in the last 72 hours. Thyroid Function Tests: No results for input(s): TSH, T4TOTAL, FREET4, T3FREE, THYROIDAB in the last 72 hours. Anemia Panel: No results for input(s): VITAMINB12, FOLATE, FERRITIN, TIBC, IRON, RETICCTPCT in the last 72 hours. Urine analysis:    Component Value Date/Time   COLORURINE YELLOW 05/07/2020 [place] 05/07/2020 1954   LABSPEC 1.008 05/07/2020 1954   PHURINE 9.0 (H) 05/07/2020 1954   GLUCOSEU NEGATIVE 05/07/2020 1954   GLUCOSEU NEGATIVE  05/04/2016 1514   HGBUR SMALL (A) 05/07/2020 1954   HGBUR small 10/09/2009 1030   [place] 05/07/2020 1954   BILIRUBINUR NEg 08/22/2018 1358   [place] 05/07/2020 1954   PROTEINUR NEGATIVE 05/07/2020 1954   UROBILINOGEN 0.2 08/22/2018 1358   UROBILINOGEN 0.2 05/04/2016 1514   NITRITE NEGATIVE 05/07/2020 1954   LEUKOCYTESUR TRACE (A) 05/07/2020 1954   Sepsis Labs: @LABRCNTIP (procalcitonin:4,lacticidven:4) )No results found for this or any previous visit (from the past 240 hour(s)).   Radiological Exams on Admission: CT ABDOMEN PELVIS W CONTRAST  Result Date: 05/07/2020 CLINICAL DATA:  Upper abdominal pain. Vomiting. Bowel obstruction suspected. EXAM: CT ABDOMEN AND PELVIS WITH CONTRAST TECHNIQUE: Multidetector CT imaging of the abdomen and pelvis was performed using the standard protocol following bolus administration of intravenous contrast. CONTRAST:  129mL OMNIPAQUE IOHEXOL 300 MG/ML  SOLN COMPARISON:  07/25/2015 FINDINGS: Lower chest: Minimal perifissural thickening of the minor fissure, not previously included in the field of view. Subsegmental atelectasis in the lingula. No pleural fluid. Hepatobiliary: No focal liver abnormality is seen. Punctate hepatic granuloma. Status post cholecystectomy. No biliary dilatation. Pancreas: No ductal  dilatation or inflammation. Stable pancreatic lipoma versus invagination of peripancreatic fat involving the uncinate process. This is of no clinical significance. Spleen: Normal in size without focal abnormality. Adrenals/Urinary Tract: Normal adrenal glands. No hydronephrosis or perinephric edema. Homogeneous renal enhancement with symmetric excretion on delayed phase imaging. Urinary bladder is partially distended without wall thickening. Stomach/Bowel: Fluid distended stomach. Fluid distends the distal esophagus with hiatal hernia. Dilated fluid-filled proximal small bowel. Transition point to nondilated in the anterior central abdomen, series 2, image 60 and series 4, image 40. There is no small bowel pneumatosis, wall thickening or inflammation. More distal small bowel are nondilated. Appendix not visualized, history of appendectomy. Minimal liquid stool throughout the colon. Scattered diverticulosis from the splenic flexure distally. There is no diverticulitis. Vascular/Lymphatic: Aorto bi-iliac atherosclerosis. No aortic aneurysm. Patent portal vein. Mild haziness at the root of the small bowel mesentery, chronic. No abdominopelvic adenopathy. Reproductive: Status post hysterectomy. No adnexal masses. Other: Small amount of free fluid in the pelvis. No free air or focal fluid collection. There is a tiny fat containing umbilical hernia. Fat in the left inguinal canal. Musculoskeletal: There are no acute or suspicious osseous abnormalities. Multilevel degenerative change in the spine. IMPRESSION: 1. Small-bowel obstruction with transition point in the anterior central abdomen, likely due to adhesions. 2. Colonic diverticulosis without diverticulitis. Aortic Atherosclerosis (ICD10-I70.0). Electronically Signed   By: Keith Rake M.D.   On: 05/07/2020 22:39   History  Assessment/Plan Principal Problem:   SBO (small bowel obstruction) (HCC) Active Problems:   Small bowel obstruction ([place])     1. Small bowel obstruction likely due to adhesions for which general surgery has been consulted and patient has been placed on NG tube suction IV fluids pain relief medications n.p.o.  Check KUB in the morning further recommendations per general surgery. 2. Acute on chronic and disease stage II likely from dehydration obstruction gently hydrate follow metabolic panel. 3. History of esophageal stenosis and GERD on Protonix.  Has had EGD dilation in April 2021. 4. History of hearing difficulty status post cochlear implant.  Since patient has small bowel obstruction will need more than 2 midnight stay in inpatient status.   DVT prophylaxis: SCDs for now since patient may require procedure avoiding pharmacological DVT prophylaxis. Code Status: Full code. Family Communication: Discussed with patient. Disposition Plan: Home. Consults called: General surgery. Admission status: Inpatient.  Rise Patience MD [facility] Pager [phone].  If 7PM-7AM, please contact night-coverage [email or web address] Password [facility]  05/07/2020, 11:56 PM

## 2020-05-07 NOTE — ED Provider Notes (Signed)
[facility] DEPT Provider Note   [name]: [id number] Arrival date & time: 05/07/20  1905     History Chief Complaint  Patient presents with  . Abdominal Pain    Emily Bentley is a 85 y.o. female.  HPI     85 year old female comes in a chief complaint of abdominal pain.  Patient has history of cholecystectomy, peptic ulcer disease, IBS and small obstruction.  She reports that she started having abdominal pain in the epigastric region yesterday.  With that she has associated nausea and vomiting that started today.  Patient is taking medications for constipation and did have a BM today.  She is burping and feels distended.  Past Medical History:  Diagnosis Date  . Allergy   . Arthritis   . Asthma   . Cataract   . Chronic allergic rhinitis    with asthmatic component  . Diverticulosis of colon (without mention of hemorrhage)   . GERD (gastroesophageal reflux disease)   . Hearing impairment   . Hiatal hernia   . History of bursitis    right shoulder  . History of cochlear implant    Cant have an MRI  . History of colon polyps   . History of pneumonia   . History of PSVT (paroxysmal supraventricular tachycardia)   . Hormone replacement therapy (postmenopausal)   . IBS (irritable bowel syndrome)   . Peptic stricture of esophagus   . Watermelon stomach     Patient Active Problem List   Diagnosis Date Noted  . SBO (small bowel obstruction) ([place]) 05/07/2020  . Dysphagia 01/04/2020  . Chronic bilateral thoracic back pain 09/28/2019  . Scalp cyst 09/28/2019  . Allergic rhinitis 09/28/2019  . Constipation 08/25/2018  . Elevated blood pressure reading in office without diagnosis of hypertension 08/25/2018  . Anxiety 02/04/2018  . Degenerative disc disease, lumbar 09/16/2015  . Routine general medical examination at a health care facility 07/02/2013  . OAB (overactive bladder) 05/25/2012  . OSTEOPENIA 10/09/2009  . Cochlear implant in place  12/30/2007  . PSVT 12/30/2007  . GERD 12/30/2007    Past Surgical History:  Procedure Laterality Date  . ABDOMINAL HYSTERECTOMY    . APPENDECTOMY    . CHOLECYSTECTOMY    . COCHLEAR IMPLANT Left Oct '14   Done at [facility] - great results  . ESOPHAGOGASTRODUODENOSCOPY    . EYE SURGERY    . POLYPECTOMY    . TUBAL LIGATION    . urethral dilatations    . VENTRICULAR ABLATION SURGERY       OB History   No obstetric history on file.     Family History  Problem Relation Age of Onset  . Diabetes Mother   . Uterine cancer Mother   . Coronary artery disease Father        CABG x 5 in 1985  . Hypertension Father   . Colon cancer Neg Hx     Social History   Tobacco Use  . Smoking status: Never Smoker  . Smokeless tobacco: Never Used  Vaping Use  . Vaping Use: Never used  Substance Use Topics  . Alcohol use: No  . Drug use: No    Home Medications Prior to Admission medications   Medication Sig Start Date End Date Taking? Authorizing Provider  acetaminophen (TYLENOL) 650 MG CR tablet Take 650 mg by mouth every 8 (eight) hours as needed for pain.   Yes [provider]  ALPRAZolam Duanne Moron) 0.5 MG tablet Take 1  tablet (0.5 mg total) by mouth at bedtime as needed for anxiety. 02/03/18  Yes [name], MD  calcium-vitamin D (OSCAL WITH D) 250-125 MG-UNIT tablet Take 1 tablet by mouth daily.   Yes [provider]  cetirizine (ZYRTEC) 5 MG tablet Take 5 mg by mouth daily.   Yes [provider]  cholecalciferol (VITAMIN D) 1000 units tablet Take 2,000 Units by mouth daily.   Yes [provider]  esomeprazole (NEXIUM) 20 MG capsule Take 20 mg by mouth daily at 12 noon.   Yes [provider]  linaclotide (LINZESS) 145 MCG CAPS capsule Take 145 mcg by mouth daily as needed (cramping).    Yes [provider]  magnesium hydroxide (MILK OF MAGNESIA) 400 MG/5ML suspension Take 30 mLs by mouth daily as needed for mild  constipation.   Yes [provider]  Sennosides (SENOKOT EXTRA STRENGTH) 17.2 MG TABS Take 1 tablet by mouth daily as needed (constipation).    Yes [provider]  traMADol (ULTRAM) 50 MG tablet TAKE 1/2 TO 1 TABLET BY MOUTH TWICE DAILY Patient taking differently: Take 50 mg by mouth every 12 (twelve) hours as needed for moderate pain.  05/02/20  Yes [name], MD    Allergies    Sulfamethoxazole-trimethoprim  Review of Systems   Review of Systems  Constitutional: Positive for activity change.  Respiratory: Negative for shortness of breath.   Cardiovascular: Negative for chest pain.  Gastrointestinal: Positive for abdominal pain, nausea and vomiting.  Allergic/Immunologic: Negative for immunocompromised state.  Hematological: Does not bruise/bleed easily.  All other systems reviewed and are negative.   Physical Exam Updated Vital Signs BP (!) 130/98 (BP Location: Left Arm)   Pulse 86   Temp 99.3 F (37.4 C) (Oral)   Resp 17   Ht 5' 3.5" (1.613 m)   Wt 78 kg   SpO2 100%   BMI 29.99 kg/m   Physical Exam Vitals and nursing note reviewed.  Constitutional:      Appearance: She is well-developed.  HENT:     Head: Normocephalic and atraumatic.  Cardiovascular:     Rate and Rhythm: Normal rate.  Pulmonary:     Effort: Pulmonary effort is normal.  Abdominal:     General: Bowel sounds are normal.     Tenderness: There is abdominal tenderness in the epigastric area. There is guarding. There is no rebound.  Musculoskeletal:     Cervical back: Normal range of motion and neck supple.  Skin:    General: Skin is warm and dry.  Neurological:     Mental Status: She is alert and oriented to person, place, and time.     ED Results / Procedures / Treatments   Labs (all labs ordered are listed, but only abnormal results are displayed) Labs Reviewed  COMPREHENSIVE METABOLIC PANEL - Abnormal; Notable for the following components:      Result Value    Glucose, Bld 110 (*)    Creatinine, Ser 1.23 (*)    Total Bilirubin 1.5 (*)    GFR calc non Af Amer 41 (*)    GFR calc Af Amer 47 (*)    All other components within normal limits  CBC - Abnormal; Notable for the following components:   WBC 11.0 (*)    RBC 5.19 (*)    All other components within normal limits  URINALYSIS, ROUTINE W REFLEX MICROSCOPIC - Abnormal; Notable for the following components:   pH 9.0 (*)    Hgb  urine dipstick SMALL (*)    Leukocytes,Ua TRACE (*)    Bacteria, UA RARE (*)    All other components within normal limits  SARS CORONAVIRUS 2 BY RT PCR (HOSPITAL ORDER, [facility] [place] [facility])  LIPASE, BLOOD    EKG None  Radiology CT ABDOMEN PELVIS W CONTRAST  Result Date: 05/07/2020 CLINICAL DATA:  Upper abdominal pain. Vomiting. Bowel obstruction suspected. EXAM: CT ABDOMEN AND PELVIS WITH CONTRAST TECHNIQUE: Multidetector CT imaging of the abdomen and pelvis was performed using the standard protocol following bolus administration of intravenous contrast. CONTRAST:  128mL OMNIPAQUE IOHEXOL 300 MG/ML  SOLN COMPARISON:  07/25/2015 FINDINGS: Lower chest: Minimal perifissural thickening of the minor fissure, not previously included in the field of view. Subsegmental atelectasis in the lingula. No pleural fluid. Hepatobiliary: No focal liver abnormality is seen. Punctate hepatic granuloma. Status post cholecystectomy. No biliary dilatation. Pancreas: No ductal dilatation or inflammation. Stable pancreatic lipoma versus invagination of peripancreatic fat involving the uncinate process. This is of no clinical significance. Spleen: Normal in size without focal abnormality. Adrenals/Urinary Tract: Normal adrenal glands. No hydronephrosis or perinephric edema. Homogeneous renal enhancement with symmetric excretion on delayed phase imaging. Urinary bladder is partially distended without wall thickening. Stomach/Bowel: Fluid distended stomach. Fluid distends the distal  esophagus with hiatal hernia. Dilated fluid-filled proximal small bowel. Transition point to nondilated in the anterior central abdomen, series 2, image 60 and series 4, image 40. There is no small bowel pneumatosis, wall thickening or inflammation. More distal small bowel are nondilated. Appendix not visualized, history of appendectomy. Minimal liquid stool throughout the colon. Scattered diverticulosis from the splenic flexure distally. There is no diverticulitis. Vascular/Lymphatic: Aorto bi-iliac atherosclerosis. No aortic aneurysm. Patent portal vein. Mild haziness at the root of the small bowel mesentery, chronic. No abdominopelvic adenopathy. Reproductive: Status post hysterectomy. No adnexal masses. Other: Small amount of free fluid in the pelvis. No free air or focal fluid collection. There is a tiny fat containing umbilical hernia. Fat in the left inguinal canal. Musculoskeletal: There are no acute or suspicious osseous abnormalities. Multilevel degenerative change in the spine. IMPRESSION: 1. Small-bowel obstruction with transition point in the anterior central abdomen, likely due to adhesions. 2. Colonic diverticulosis without diverticulitis. Aortic Atherosclerosis (ICD10-I70.0). Electronically Signed   By: Keith Rake M.D.   On: 05/07/2020 22:39    Procedures Procedures (including critical care time)  Medications Ordered in ED Medications  sodium chloride flush (NS) 0.9 % injection 3 mL (has no administration in time range)  sodium chloride (PF) 0.9 % injection (has no administration in time range)  lidocaine (XYLOCAINE) 2 % jelly 1 application (has no administration in time range)  ondansetron (ZOFRAN) injection 4 mg (4 mg Intravenous Given 05/07/20 2055)  morphine 4 MG/ML injection 4 mg (4 mg Intravenous Given 05/07/20 2247)  sodium chloride 0.9 % bolus 1,000 mL (1,000 mLs Intravenous New Bag/Given 05/07/20 2245)  iohexol (OMNIPAQUE) 300 MG/ML solution 100 mL (100 mLs Intravenous  Contrast Given 05/07/20 2144)    ED Course  I have reviewed the triage vital signs and the nursing notes.  Pertinent labs & imaging results that were available during my care of the patient were reviewed by me and considered in my medical decision making (see chart for details).    MDM Rules/Calculators/A&P                          85 year old comes in a chief complaint of epigastric abdominal  pain.  DDx includes: Pancreatitis Hepatobiliary pathology including cholecystitis Gastritis/PUD SBO  On exam patient patient is noted to have guarding over the epigastric region.  CT scan ordered with suspicion for small bowel obstruction, which is confirmed.  I spoke with Dr. Ninfa Linden, general surgery who will see the patient.  He is requested medicine to admit.  Results of the ED work-up discussed with the patient.  Final Clinical Impression(s) / ED Diagnoses Final diagnoses:  SBO (small bowel obstruction) ([place])    Rx / DC Orders ED Discharge Orders    None       Varney Biles, MD 05/07/20 2309

## 2020-05-08 ENCOUNTER — Inpatient Hospital Stay (HOSPITAL_COMMUNITY): Payer: Medicare Other

## 2020-05-08 LAB — BASIC METABOLIC PANEL
Anion gap: 9 (ref 5–15)
BUN: 14 mg/dL (ref 8–23)
CO2: 20 mmol/L — ABNORMAL LOW (ref 22–32)
Calcium: 8.4 mg/dL — ABNORMAL LOW (ref 8.9–10.3)
Chloride: 113 mmol/L — ABNORMAL HIGH (ref 98–111)
Creatinine, Ser: 1.09 mg/dL — ABNORMAL HIGH (ref 0.44–1.00)
GFR calc Af Amer: 55 mL/min — ABNORMAL LOW (ref >60)
GFR calc non Af Amer: 47 mL/min — ABNORMAL LOW (ref >60)
Glucose, Bld: 130 mg/dL — ABNORMAL HIGH (ref 70–99)
Potassium: 4.6 mmol/L (ref 3.5–5.1)
Sodium: 142 mmol/L (ref 135–145)

## 2020-05-08 LAB — SARS CORONAVIRUS 2 BY RT PCR (HOSPITAL ORDER, PERFORMED IN ~~LOC~~ HOSPITAL LAB): SARS Coronavirus 2: NEGATIVE

## 2020-05-08 LAB — CBC
HCT: 35.3 % — ABNORMAL LOW (ref 36.0–46.0)
Hemoglobin: 11.7 g/dL — ABNORMAL LOW (ref 12.0–15.0)
MCH: 26.9 pg (ref 26.0–34.0)
MCHC: 33.1 g/dL (ref 30.0–36.0)
MCV: 81.1 fL (ref 80.0–100.0)
Platelets: 187 10*3/uL (ref 150–400)
RBC: 4.35 MIL/uL (ref 3.87–5.11)
RDW: 14.7 % (ref 11.5–15.5)
WBC: 6.5 10*3/uL (ref 4.0–10.5)
nRBC: 0 % (ref 0.0–0.2)

## 2020-05-08 MED ORDER — MENTHOL 3 MG MT LOZG
1.0000 | LOZENGE | OROMUCOSAL | Status: DC | PRN
  Filled 2020-05-08: qty 9

## 2020-05-08 MED ORDER — PANTOPRAZOLE SODIUM 40 MG IV SOLR
40.0000 mg | Freq: Every day | INTRAVENOUS | Status: DC
  Administered 2020-05-08 – 2020-05-09 (×2): 40 mg via INTRAVENOUS
  Filled 2020-05-08 (×2): qty 40

## 2020-05-08 MED ORDER — DIATRIZOATE MEGLUMINE & SODIUM 66-10 % PO SOLN
90.0000 mL | Freq: Once | ORAL | Status: AC
  Administered 2020-05-08: 90 mL via NASOGASTRIC
  Filled 2020-05-08: qty 90

## 2020-05-08 MED ORDER — PHENOL 1.4 % MT LIQD
1.0000 | OROMUCOSAL | Status: DC | PRN
  Filled 2020-05-08: qty 177

## 2020-05-08 NOTE — Consult Note (Signed)
[facility] Consult Note  Emily Bentley 08/11/1935  [id number].    Requesting MD: Hal Hope Chief Complaint:abdominal pain Reason for Consult: SBO  HPI: Patient is an 85 year old female presented to the ED last evening complaining of upper abdominal pain.  She has a history of constipation.  She developed nausea vomiting and ongoing pain without any relief.  She has a history of cholecystectomy, abdominal hysterectomy and appendectomy in the past.  Additional history includes cochlear implant, PSVT, esophageal stricture with dilatation.  Patient reports she started feeling bad on Monday, 05/06/2020.  She was not hungry.  She filled easily.  She has issues with chronic constipation and has been placed on Linzess which she uses intermittently.  She says mag citrate does not work on her.  She took some milk of magnesia on Monday and had a bowel movement both Monday and Tuesday.  Yesterday she became more distended and worsening abdominal discomfort.  Her sister brought her to the ED as she developed nausea and vomiting in the ED.  She had a similar episode about 5 years ago, but her review of the chart shows it was actually 2011.  She improved with medical management.  Work-up in the ED shows she is afebrile T-max 99.3.  Blood pressure 130/98.  Remaining vital signs are normal.  Admission labs shows a creatinine of 1.23, glucose of 110, total bilirubin 1.5, WBC 11.0, platelets 214,000, H/H 13.9/42.4.  Covid is negative.  CT scan shows fluid distended stomach, fluid distends the distal esophagus with a hiatal hernia, dilated fluid-filled proximal small bowel.  Transition point to the nondilated in the anterior central abdomen.  There is no small bowel pneumatosis, wall thickening or inflammation.  More distal small bowel is nondilated.  Appendix was not visualized with history of appendectomy minimal cyst liquid stool throughout the colon.  Scattered diverticulosis from the splenic flexure  distally with no diverticulitis.  Findings consistent with small bowel obstruction with transition point in the anterior central abdomen.     ROS: Review of Systems  Constitutional: Negative.   HENT: Negative.   Eyes: Negative.   Respiratory: Negative.   Cardiovascular: Negative.   Gastrointestinal: Positive for abdominal pain and constipation.  Genitourinary: Negative.   Musculoskeletal: Negative.   Skin: Negative.   Neurological: Negative.   Endo/Heme/Allergies: Negative.   Psychiatric/Behavioral: Negative.     Family History  Problem Relation Age of Onset  . Diabetes Mother   . Uterine cancer Mother   . Coronary artery disease Father        CABG x 5 in 1985  . Hypertension Father   . Colon cancer Neg Hx     Past Medical History:  Diagnosis Date  . Allergy   . Arthritis   . Asthma   . Cataract   . Chronic allergic rhinitis    with asthmatic component  . Diverticulosis of colon (without mention of hemorrhage)   . GERD (gastroesophageal reflux disease)   . Hearing impairment   . Hiatal hernia   . History of bursitis    right shoulder  . History of cochlear implant    Cant have an MRI  . History of colon polyps   . History of pneumonia   . History of PSVT (paroxysmal supraventricular tachycardia)   . Hormone replacement therapy (postmenopausal)   . IBS (irritable bowel syndrome)   . Peptic stricture of esophagus   . Watermelon stomach     Past Surgical History:  Procedure Laterality Date  .  ABDOMINAL HYSTERECTOMY    . APPENDECTOMY    . CHOLECYSTECTOMY    . COCHLEAR IMPLANT Left Oct '14   Done at [facility] - great results  . ESOPHAGOGASTRODUODENOSCOPY    . EYE SURGERY    . POLYPECTOMY    . TUBAL LIGATION    . urethral dilatations    . VENTRICULAR ABLATION SURGERY      Social History:  reports that she has never smoked. She has never used smokeless tobacco. She reports that she does not drink alcohol and does not use drugs.  Allergies:   Allergies  Allergen Reactions  . Sulfamethoxazole-Trimethoprim     Medications Prior to Admission  Medication Sig Dispense Refill  . acetaminophen (TYLENOL) 650 MG CR tablet Take 650 mg by mouth every 8 (eight) hours as needed for pain.    Marland Kitchen ALPRAZolam (XANAX) 0.5 MG tablet Take 1 tablet (0.5 mg total) by mouth at bedtime as needed for anxiety. 30 tablet 0  . calcium-vitamin D (OSCAL WITH D) 250-125 MG-UNIT tablet Take 1 tablet by mouth daily.    . cetirizine (ZYRTEC) 5 MG tablet Take 5 mg by mouth daily.    . cholecalciferol (VITAMIN D) 1000 units tablet Take 2,000 Units by mouth daily.    Marland Kitchen esomeprazole (NEXIUM) 20 MG capsule Take 20 mg by mouth daily at 12 noon.    . linaclotide (LINZESS) 145 MCG CAPS capsule Take 145 mcg by mouth daily as needed (cramping).     . magnesium hydroxide (MILK OF MAGNESIA) 400 MG/5ML suspension Take 30 mLs by mouth daily as needed for mild constipation.    . Sennosides (SENOKOT EXTRA STRENGTH) 17.2 MG TABS Take 1 tablet by mouth daily as needed (constipation).     . traMADol (ULTRAM) 50 MG tablet TAKE 1/2 TO 1 TABLET BY MOUTH TWICE DAILY (Patient taking differently: Take 50 mg by mouth every 12 (twelve) hours as needed for moderate pain. ) 30 tablet 4    Blood pressure 134/82, pulse 76, temperature 98.4 F (36.9 C), temperature source Oral, resp. rate 18, height 5' 3.5" (1.613 m), weight 78 kg, SpO2 98 %. Physical Exam:  General: pleasant, WD, WN AA female who is laying in bed in NAD HEENT: head is normocephalic, atraumatic.  Sclera are noninjected.  PERRL.  Ears and nose without any masses or lesions.  Mouth is pink and moist Heart: regular, rate, and rhythm.  Normal s1,s2. No obvious murmurs, gallops, or rubs noted.  Palpable radial and pedal pulses bilaterally Lungs: CTAB, no wheezes, rhonchi, or rales noted.  Respiratory effort nonlabored Abd: soft, NT, ND, +BS, no masses, hernias, or organomegaly.  NG in place with almost no drainage, irrigated NG and  it appears to be working well. MS: all 4 extremities are symmetrical with no cyanosis, clubbing, or edema. Skin: warm and dry with no masses, lesions, or rashes Neuro: Cranial nerves 2-12 grossly intact, sensation is normal throughout Psych: A&Ox3 with an appropriate affect.   Results for orders placed or performed during the hospital encounter of 05/07/20 (from the past 48 hour(s))  Urinalysis, Routine w reflex microscopic     Status: Abnormal   Collection Time: 05/07/20  7:54 PM  Result Value Ref Range   Color, Urine YELLOW YELLOW   APPearance CLEAR CLEAR   Specific Gravity, Urine 1.008 1.005 - 1.030   pH 9.0 (H) 5.0 - 8.0   Glucose, UA NEGATIVE NEGATIVE mg/dL   Hgb urine dipstick SMALL (A) NEGATIVE   Bilirubin Urine NEGATIVE NEGATIVE  Ketones, ur NEGATIVE NEGATIVE mg/dL   Protein, ur NEGATIVE NEGATIVE mg/dL   Nitrite NEGATIVE NEGATIVE   Leukocytes,Ua TRACE (A) NEGATIVE   RBC / HPF 6-10 0 - 5 RBC/hpf   WBC, UA 6-10 0 - 5 WBC/hpf   Bacteria, UA RARE (A) NONE SEEN   Squamous Epithelial / LPF 0-5 0 - 5   Mucus PRESENT    Hyaline Casts, UA PRESENT     Comment: Performed at [facility], [place] [address], [place], [place] [address]  Lipase, blood     Status: None   Collection Time: 05/07/20  8:22 PM  Result Value Ref Range   Lipase 27 11 - 51 U/L    Comment: Performed at [facility], [place] [address], [place], [place] [address]  Comprehensive metabolic panel     Status: Abnormal   Collection Time: 05/07/20  8:22 PM  Result Value Ref Range   Sodium 141 135 - 145 mmol/L   Potassium 4.5 3.5 - 5.1 mmol/L   Chloride 106 98 - 111 mmol/L   CO2 24 22 - 32 mmol/L   Glucose, Bld 110 (H) 70 - 99 mg/dL    Comment: Glucose reference range applies only to samples taken after fasting for at least 8 hours.   BUN 16 8 - 23 mg/dL   Creatinine, Ser 1.23 (H) 0.44 - 1.00 mg/dL   Calcium 9.6 8.9 - 10.3 mg/dL   Total Protein 7.9 6.5 - 8.1 g/dL   Albumin 4.3 3.5  - 5.0 g/dL   AST 36 15 - 41 U/L   ALT 15 0 - 44 U/L   Alkaline Phosphatase 73 38 - 126 U/L   Total Bilirubin 1.5 (H) 0.3 - 1.2 mg/dL   GFR calc non Af Amer 41 (L) >60 mL/min   GFR calc Af Amer 47 (L) >60 mL/min   Anion gap 11 5 - 15    Comment: Performed at [facility], [place] [address], [place], [place] [address]  CBC     Status: Abnormal   Collection Time: 05/07/20  8:22 PM  Result Value Ref Range   WBC 11.0 (H) 4.0 - 10.5 K/uL   RBC 5.19 (H) 3.87 - 5.11 MIL/uL   Hemoglobin 13.9 12.0 - 15.0 g/dL   HCT 42.4 36 - 46 %   MCV 81.7 80.0 - 100.0 fL   MCH 26.8 26.0 - 34.0 pg   MCHC 32.8 30.0 - 36.0 g/dL   RDW 14.6 11.5 - 15.5 %   Platelets 214 150 - 400 K/uL   nRBC 0.0 0.0 - 0.2 %    Comment: Performed at [facility], [place] [address], [place], [place] [address]  SARS Coronavirus 2 by RT PCR (hospital order, performed in [facility] hospital lab) Nasopharyngeal Nasopharyngeal Swab     Status: None   Collection Time: 05/07/20 11:30 PM   Specimen: Nasopharyngeal Swab  Result Value Ref Range   SARS Coronavirus 2 NEGATIVE NEGATIVE    Comment: (NOTE) SARS-CoV-2 target nucleic acids are NOT DETECTED.  The SARS-CoV-2 RNA is generally detectable in upper and lower respiratory specimens during the acute phase of infection. The lowest concentration of SARS-CoV-2 viral copies this assay can detect is 250 copies / mL. A negative result does not preclude SARS-CoV-2 infection and should not be used as the sole basis for treatment or other patient management decisions.  A negative result may occur with improper specimen collection / handling, submission of specimen other  than nasopharyngeal swab, presence of viral mutation(s) within the areas targeted by this assay, and inadequate number of viral copies (<250 copies / mL). A negative result must be combined with clinical observations, patient history, and epidemiological information.  Fact Sheet for  Patients:   StrictlyIdeas.no  Fact Sheet for Healthcare Providers: BankingDealers.co.za  This test is not yet approved or  cleared by the [place] FDA and has been authorized for detection and/or diagnosis of SARS-CoV-2 by FDA under an Emergency Use Authorization (EUA).  This EUA will remain in effect (meaning this test can be used) for the duration of the COVID-19 declaration under Section 564(b)(1) of the Act, 21 U.S.C. section 360bbb-3(b)(1), unless the authorization is terminated or revoked sooner.  Performed at [facility], [place] [address] [place], [place] [address]   Basic metabolic panel     Status: Abnormal   Collection Time: 05/08/20  4:53 AM  Result Value Ref Range   Sodium 142 135 - 145 mmol/L   Potassium 4.6 3.5 - 5.1 mmol/L   Chloride 113 (H) 98 - 111 mmol/L   CO2 20 (L) 22 - 32 mmol/L   Glucose, Bld 130 (H) 70 - 99 mg/dL    Comment: Glucose reference range applies only to samples taken after fasting for at least 8 hours.   BUN 14 8 - 23 mg/dL   Creatinine, Ser 1.09 (H) 0.44 - 1.00 mg/dL   Calcium 8.4 (L) 8.9 - 10.3 mg/dL   GFR calc non Af Amer 47 (L) >60 mL/min   GFR calc Af Amer 55 (L) >60 mL/min   Anion gap 9 5 - 15    Comment: Performed at [facility], [place] [address], [place], [place] [address]   CT ABDOMEN PELVIS W CONTRAST  Result Date: 05/07/2020 CLINICAL DATA:  Upper abdominal pain. Vomiting. Bowel obstruction suspected. EXAM: CT ABDOMEN AND PELVIS WITH CONTRAST TECHNIQUE: Multidetector CT imaging of the abdomen and pelvis was performed using the standard protocol following bolus administration of intravenous contrast. CONTRAST:  121mL OMNIPAQUE IOHEXOL 300 MG/ML  SOLN COMPARISON:  07/25/2015 FINDINGS: Lower chest: Minimal perifissural thickening of the minor fissure, not previously included in the field of view. Subsegmental atelectasis in the lingula. No pleural fluid.  Hepatobiliary: No focal liver abnormality is seen. Punctate hepatic granuloma. Status post cholecystectomy. No biliary dilatation. Pancreas: No ductal dilatation or inflammation. Stable pancreatic lipoma versus invagination of peripancreatic fat involving the uncinate process. This is of no clinical significance. Spleen: Normal in size without focal abnormality. Adrenals/Urinary Tract: Normal adrenal glands. No hydronephrosis or perinephric edema. Homogeneous renal enhancement with symmetric excretion on delayed phase imaging. Urinary bladder is partially distended without wall thickening. Stomach/Bowel: Fluid distended stomach. Fluid distends the distal esophagus with hiatal hernia. Dilated fluid-filled proximal small bowel. Transition point to nondilated in the anterior central abdomen, series 2, image 60 and series 4, image 40. There is no small bowel pneumatosis, wall thickening or inflammation. More distal small bowel are nondilated. Appendix not visualized, history of appendectomy. Minimal liquid stool throughout the colon. Scattered diverticulosis from the splenic flexure distally. There is no diverticulitis. Vascular/Lymphatic: Aorto bi-iliac atherosclerosis. No aortic aneurysm. Patent portal vein. Mild haziness at the root of the small bowel mesentery, chronic. No abdominopelvic adenopathy. Reproductive: Status post hysterectomy. No adnexal masses. Other: Small amount of free fluid in the pelvis. No free air or focal fluid collection. There is a tiny fat containing umbilical hernia. Fat in the left inguinal canal. Musculoskeletal: There are  no acute or suspicious osseous abnormalities. Multilevel degenerative change in the spine. IMPRESSION: 1. Small-bowel obstruction with transition point in the anterior central abdomen, likely due to adhesions. 2. Colonic diverticulosis without diverticulitis. Aortic Atherosclerosis (ICD10-I70.0). Electronically Signed   By: Keith Rake M.D.   On: 05/07/2020 22:39    DG Abd Portable 1 View  Result Date: 05/08/2020 CLINICAL DATA:  Enteric catheter placement EXAM: PORTABLE ABDOMEN - 1 VIEW COMPARISON:  05/07/2020 FINDINGS: Supine frontal view of the abdomen and pelvis demonstrates enteric catheter passing below diaphragm, tip and side port projecting over gastric fundus. Mild gaseous distention of the small bowel again noted. Excreted contrast within the bladder. Multilevel lumbar spondylosis unchanged. IMPRESSION: 1. Enteric catheter overlying gastric fundus. 2. Continued gaseous distention of the small bowel. Electronically Signed   By: Randa Ngo M.D.   On: 05/08/2020 00:07   . dextrose 5 % and 0.9% NaCl 100 mL/hr at 05/08/20 [id number]      Assessment/Plan Chronic constipation Esophageal stricture with dilatation -GI: Dr. Henrene Pastor GERD   SBO Hx SBO 2011, Hx appendectomy, cholecystectomy, abdominal hysterectomy  FEN: N.p.o./IV fluids ID: None DVT: SCDs Follow-up: TBD  Plan: Continue NG drainage for now.  She is not having much drainage on the tube is very irritating.  Small bowel protocol today.  Earnstine [facility] 05/08/2020, 6:53 AM Please see Amion for pager number during day hours 7:00am-4:30pm

## 2020-05-08 NOTE — Progress Notes (Signed)
PROGRESS NOTE    Emily Bentley  JHE:[id number] DOB: May 21, 1935 DOA: 05/07/2020 PCP: [name], MD   Brief Narrative:  Emily Bentley is a 85 y.o. female with history of hearing impairment, GERD with esophageal gnosis requiring dilation in April 2021 on PPI with history of chronic constipation has been experiencing abdominal pain for the last 2 days initially started in the lower abdomen then became more diffuse.  Denies nausea vomiting.  Last bowel movement was yesterday after patient took some magnesium citrate.  Given the worsening pain patient presents to the ED. In the ED patient had a CT abdomen pelvis which shows features consistent with small bowel obstruction with transition point likely due to adhesions.  ED physician discussed with on-call general surgeon Dr. Ninfa Linden who advised NG tube and will be seeing patient in consult.  Covid test was negative.  Patient's labs are significant for creatinine around 1.2 which is increased from 0.05 WBC 11.   Assessment & Plan:   Principal Problem:   SBO (small bowel obstruction) (HCC) Active Problems:   Small bowel obstruction (HCC)  Intractable abdominal pain nausea vomiting in the setting of small bowel obstruction Surgery following, appreciate insight recommendations  Continue NG tube, output diminished over the past 24 hours  Small bowel follow-through imaging per surgery  Continue supportive care for nausea and vomiting  AKI on CKD 2, improving likely from dehydration and above Lab Results  Component Value Date   CREATININE 1.09 (H) 05/08/2020   CREATININE 1.23 (H) 05/07/2020   CREATININE 1.05 09/27/2019   History of esophageal stenosis and GERD on Protonix.  Status post recent EGD dilation in April 2021.  Hearing impairment  status post cochlear implant.  DVT prophylaxis: SCDs perioperatively Code Status: FULL Family Communication: None present  Status is: Inpatient  Dispo: The patient is from: Home               Anticipated d/c is to: Home              Anticipated d/c date is: Likely the next 24 to 48 hours              Patient currently not medically stable for discharge given need for ongoing monitoring, imaging and possible surgical evaluation and intervention  Consultants:   General surgery  Procedures:   None planned  Antimicrobials:  None indicated  Subjective: No acute issues or events overnight, nausea vomiting and pain moderately well controlled on current regimen, denies headache, fever, chills, shortness of breath, chest pain.  Only complaint this morning is that the NG tube is irritating her throat  Objective: Vitals:   05/07/20 2234 05/07/20 2341 05/08/20 0019 05/08/20 0536  BP: (!) 130/98 (!) 151/85 130/80 134/82  Pulse: 86 79 93 76  Resp: 17 16 18 18   Temp:  98.8 F (37.1 C) 99.1 F (37.3 C) 98.4 F (36.9 C)  TempSrc:  Oral Oral Oral  SpO2: 100% 99% 99% 98%  Weight:      Height:        Intake/Output Summary (Last 24 hours) at 05/08/2020 0707 Last data filed at 05/08/2020 0600 Gross per 24 hour  Intake 1217.22 ml  Output 960 ml  Net 257.22 ml   Filed Weights   05/07/20 1952  Weight: 78 kg    Examination:  General:  Pleasantly resting in bed, No acute distress. HEENT:  Normocephalic atraumatic.  Sclerae nonicteric, noninjected.  Extraocular movements intact bilaterally. Neck:  Without mass or deformity.  Trachea is midline. Lungs:  Clear to auscultate bilaterally without rhonchi, wheeze, or rales. Heart:  Regular rate and rhythm.  Without murmurs, rubs, or gallops. Abdomen:  Soft, nontender, nondistended.  Without guarding or rebound. Extremities: Without cyanosis, clubbing, edema, or obvious deformity. Vascular:  Dorsalis pedis and posterior tibial pulses palpable bilaterally. Skin:  Warm and dry, no erythema, no ulcerations.   Data Reviewed: I have personally reviewed following labs and imaging studies  CBC: Recent Labs  Lab  05/07/20 2022  WBC 11.0*  HGB 13.9  HCT 42.4  MCV 81.7  PLT 836   Basic Metabolic Panel: Recent Labs  Lab 05/07/20 2022 05/08/20 0453  NA 141 142  K 4.5 4.6  CL 106 113*  CO2 24 20*  GLUCOSE 110* 130*  BUN 16 14  CREATININE 1.23* 1.09*  CALCIUM 9.6 8.4*   GFR: Estimated Creatinine Clearance: 39.8 mL/min (A) (by C-G formula based on SCr of 1.09 mg/dL (H)). Liver Function Tests: Recent Labs  Lab 05/07/20 2022  AST 36  ALT 15  ALKPHOS 73  BILITOT 1.5*  PROT 7.9  ALBUMIN 4.3   Recent Labs  Lab 05/07/20 2022  LIPASE 27   No results for input(s): AMMONIA in the last 168 hours. Coagulation Profile: No results for input(s): INR, PROTIME in the last 168 hours. Cardiac Enzymes: No results for input(s): CKTOTAL, CKMB, CKMBINDEX, TROPONINI in the last 168 hours. BNP (last 3 results) No results for input(s): PROBNP in the last 8760 hours. HbA1C: No results for input(s): HGBA1C in the last 72 hours. CBG: No results for input(s): GLUCAP in the last 168 hours. Lipid Profile: No results for input(s): CHOL, HDL, LDLCALC, TRIG, CHOLHDL, LDLDIRECT in the last 72 hours. Thyroid Function Tests: No results for input(s): TSH, T4TOTAL, FREET4, T3FREE, THYROIDAB in the last 72 hours. Anemia Panel: No results for input(s): VITAMINB12, FOLATE, FERRITIN, TIBC, IRON, RETICCTPCT in the last 72 hours. Sepsis Labs: No results for input(s): PROCALCITON, LATICACIDVEN in the last 168 hours.  Recent Results (from the past 240 hour(s))  SARS Coronavirus 2 by RT PCR (hospital order, performed in [facility] hospital lab) Nasopharyngeal Nasopharyngeal Swab     Status: None   Collection Time: 05/07/20 11:30 PM   Specimen: Nasopharyngeal Swab  Result Value Ref Range Status   SARS Coronavirus 2 NEGATIVE NEGATIVE Final    Comment: (NOTE) SARS-CoV-2 target nucleic acids are NOT DETECTED.  The SARS-CoV-2 RNA is generally detectable in upper and lower respiratory specimens during the acute  phase of infection. The lowest concentration of SARS-CoV-2 viral copies this assay can detect is 250 copies / mL. A negative result does not preclude SARS-CoV-2 infection and should not be used as the sole basis for treatment or other patient management decisions.  A negative result may occur with improper specimen collection / handling, submission of specimen other than nasopharyngeal swab, presence of viral mutation(s) within the areas targeted by this assay, and inadequate number of viral copies (<250 copies / mL). A negative result must be combined with clinical observations, patient history, and epidemiological information.  Fact Sheet for Patients:   StrictlyIdeas.no  Fact Sheet for Healthcare Providers: BankingDealers.co.za  This test is not yet approved or  cleared by the [place] FDA and has been authorized for detection and/or diagnosis of SARS-CoV-2 by FDA under an Emergency Use Authorization (EUA).  This EUA will remain in effect (meaning this test can be used) for the duration of the COVID-19 declaration under Section 564(b)(1) of the Act,  21 U.S.C. section 360bbb-3(b)(1), unless the authorization is terminated or revoked sooner.  Performed at [facility], [place] [address], [place], [place] [address]          Radiology Studies: CT ABDOMEN PELVIS W CONTRAST  Result Date: 05/07/2020 CLINICAL DATA:  Upper abdominal pain. Vomiting. Bowel obstruction suspected. EXAM: CT ABDOMEN AND PELVIS WITH CONTRAST TECHNIQUE: Multidetector CT imaging of the abdomen and pelvis was performed using the standard protocol following bolus administration of intravenous contrast. CONTRAST:  16mL OMNIPAQUE IOHEXOL 300 MG/ML  SOLN COMPARISON:  07/25/2015 FINDINGS: Lower chest: Minimal perifissural thickening of the minor fissure, not previously included in the field of view. Subsegmental atelectasis in the lingula. No pleural  fluid. Hepatobiliary: No focal liver abnormality is seen. Punctate hepatic granuloma. Status post cholecystectomy. No biliary dilatation. Pancreas: No ductal dilatation or inflammation. Stable pancreatic lipoma versus invagination of peripancreatic fat involving the uncinate process. This is of no clinical significance. Spleen: Normal in size without focal abnormality. Adrenals/Urinary Tract: Normal adrenal glands. No hydronephrosis or perinephric edema. Homogeneous renal enhancement with symmetric excretion on delayed phase imaging. Urinary bladder is partially distended without wall thickening. Stomach/Bowel: Fluid distended stomach. Fluid distends the distal esophagus with hiatal hernia. Dilated fluid-filled proximal small bowel. Transition point to nondilated in the anterior central abdomen, series 2, image 60 and series 4, image 40. There is no small bowel pneumatosis, wall thickening or inflammation. More distal small bowel are nondilated. Appendix not visualized, history of appendectomy. Minimal liquid stool throughout the colon. Scattered diverticulosis from the splenic flexure distally. There is no diverticulitis. Vascular/Lymphatic: Aorto bi-iliac atherosclerosis. No aortic aneurysm. Patent portal vein. Mild haziness at the root of the small bowel mesentery, chronic. No abdominopelvic adenopathy. Reproductive: Status post hysterectomy. No adnexal masses. Other: Small amount of free fluid in the pelvis. No free air or focal fluid collection. There is a tiny fat containing umbilical hernia. Fat in the left inguinal canal. Musculoskeletal: There are no acute or suspicious osseous abnormalities. Multilevel degenerative change in the spine. IMPRESSION: 1. Small-bowel obstruction with transition point in the anterior central abdomen, likely due to adhesions. 2. Colonic diverticulosis without diverticulitis. Aortic Atherosclerosis (ICD10-I70.0). Electronically Signed   By: Keith Rake M.D.   On: 05/07/2020  22:39   DG Abd Portable 1 View  Result Date: 05/08/2020 CLINICAL DATA:  Enteric catheter placement EXAM: PORTABLE ABDOMEN - 1 VIEW COMPARISON:  05/07/2020 FINDINGS: Supine frontal view of the abdomen and pelvis demonstrates enteric catheter passing below diaphragm, tip and side port projecting over gastric fundus. Mild gaseous distention of the small bowel again noted. Excreted contrast within the bladder. Multilevel lumbar spondylosis unchanged. IMPRESSION: 1. Enteric catheter overlying gastric fundus. 2. Continued gaseous distention of the small bowel. Electronically Signed   By: Randa Ngo M.D.   On: 05/08/2020 00:07        Scheduled Meds: . pantoprazole (PROTONIX) IV  40 mg Intravenous QHS  . sodium chloride (PF)      . sodium chloride flush  3 mL Intravenous Once   Continuous Infusions: . dextrose 5 % and 0.9% NaCl 100 mL/hr at 05/08/20 0052     LOS: 1 day   Time spent: 84min  Daray Polgar C Gray Maugeri, DO [facility]  If 7PM-7AM, please contact night-coverage [email or web address]  05/08/2020, 7:07 AM

## 2020-05-09 LAB — CBC
HCT: 37.9 % (ref 36.0–46.0)
Hemoglobin: 11.8 g/dL — ABNORMAL LOW (ref 12.0–15.0)
MCH: 26.5 pg (ref 26.0–34.0)
MCHC: 31.1 g/dL (ref 30.0–36.0)
MCV: 85 fL (ref 80.0–100.0)
Platelets: 179 10*3/uL (ref 150–400)
RBC: 4.46 MIL/uL (ref 3.87–5.11)
RDW: 15.3 % (ref 11.5–15.5)
WBC: 7.5 10*3/uL (ref 4.0–10.5)
nRBC: 0 % (ref 0.0–0.2)

## 2020-05-09 LAB — BASIC METABOLIC PANEL
Anion gap: 5 (ref 5–15)
BUN: 8 mg/dL (ref 8–23)
CO2: 23 mmol/L (ref 22–32)
Calcium: 8.5 mg/dL — ABNORMAL LOW (ref 8.9–10.3)
Chloride: 116 mmol/L — ABNORMAL HIGH (ref 98–111)
Creatinine, Ser: 0.87 mg/dL (ref 0.44–1.00)
GFR calc Af Amer: >60 mL/min (ref >60)
GFR calc non Af Amer: >60 mL/min (ref >60)
Glucose, Bld: 108 mg/dL — ABNORMAL HIGH (ref 70–99)
Potassium: 3.6 mmol/L (ref 3.5–5.1)
Sodium: 144 mmol/L (ref 135–145)

## 2020-05-09 NOTE — Progress Notes (Signed)
PROGRESS NOTE    Emily Bentley  CZY:[id number] DOB: 1935/07/29 DOA: 05/07/2020 PCP: [name], MD   Brief Narrative:  Emily Bentley is a 85 y.o. female with history of hearing impairment, GERD with esophageal gnosis requiring dilation in April 2021 on PPI with history of chronic constipation has been experiencing abdominal pain for the last 2 days initially started in the lower abdomen then became more diffuse.  Denies nausea vomiting.  Last bowel movement was yesterday after patient took some magnesium citrate.  Given the worsening pain patient presents to the ED. In the ED patient had a CT abdomen pelvis which shows features consistent with small bowel obstruction with transition point likely due to adhesions.  ED physician discussed with on-call general surgeon Dr. Ninfa Linden who advised NG tube and will be seeing patient in consult.  Covid test was negative.  Patient's labs are significant for creatinine around 1.2 which is increased from 0.05 WBC 11.   Assessment & Plan:   Principal Problem:   SBO (small bowel obstruction) (HCC) Active Problems:   Small bowel obstruction (HCC)  Intractable abdominal pain nausea vomiting in the setting of small bowel obstruction Surgery following, appreciate insight recommendations  NG tube discontinued Multiple small loose bowel movements overnight/early a.m. Small bowel follow-through shows resolving SBO - advance diet per Sx - now on clears -we will continue to advance as tolerated Continue supportive care for nausea and vomiting -none overnight  AKI on CKD 2, improving likely from dehydration and above Lab Results  Component Value Date   CREATININE 0.87 05/09/2020   CREATININE 1.09 (H) 05/08/2020   CREATININE 1.23 (H) 05/07/2020   History of esophageal stenosis and GERD on Protonix.  Status post recent EGD dilation in April 2021.  Hearing impairment  status post cochlear implant.  DVT prophylaxis: SCDs Code Status:  FULL Family Communication: None present  Status is: Inpatient  Dispo: The patient is from: Home              Anticipated d/c is to: Home              Anticipated d/c date is: Likely the next 24 to 48 hours              Patient currently not medically stable for discharge given need for ongoing monitoring, imaging and possible surgical evaluation and intervention  Consultants:   General surgery  Procedures:   None planned  Antimicrobials:  None indicated  Subjective: No acute issues or events overnight, notable small bowel movements overnight, somewhat loose per patient appears to be well controlled at this time denies ongoing nausea vomiting or constipation.  Denies chest pain, shortness of breath, headache, fevers, chills.  Thankful to have NG tube removed.  Objective: Vitals:   05/08/20 1335 05/08/20 1402 05/08/20 2124 05/09/20 0521  BP: 136/75 (!) 141/76 (!) 170/95 (!) 158/75  Pulse: 63 64 72 62  Resp:  17 18 19   Temp: 98.1 F (36.7 C) 98.4 F (36.9 C) 99.4 F (37.4 C) 99.5 F (37.5 C)  TempSrc: Oral  Oral Oral  SpO2: 100% 99% 98% 98%  Weight:      Height:        Intake/Output Summary (Last 24 hours) at 05/09/2020 1209 Last data filed at 05/09/2020 0918 Gross per 24 hour  Intake 2507.72 ml  Output 1850 ml  Net 657.72 ml   Filed Weights   05/07/20 1952  Weight: 78 kg    Examination:  General:  Pleasantly resting in bed, No acute distress. HEENT:  Normocephalic atraumatic.  Sclerae nonicteric, noninjected.  Extraocular movements intact bilaterally. Neck:  Without mass or deformity.  Trachea is midline. Lungs:  Clear to auscultate bilaterally without rhonchi, wheeze, or rales. Heart:  Regular rate and rhythm.  Without murmurs, rubs, or gallops. Abdomen:  Soft, nontender, nondistended.  Without guarding or rebound. Extremities: Without cyanosis, clubbing, edema, or obvious deformity. Vascular:  Dorsalis pedis and posterior tibial pulses palpable  bilaterally. Skin:  Warm and dry, no erythema, no ulcerations.   Data Reviewed: I have personally reviewed following labs and imaging studies  CBC: Recent Labs  Lab 05/07/20 2022 05/08/20 0705 05/09/20 0506  WBC 11.0* 6.5 7.5  HGB 13.9 11.7* 11.8*  HCT 42.4 35.3* 37.9  MCV 81.7 81.1 85.0  PLT 214 187 025   Basic Metabolic Panel: Recent Labs  Lab 05/07/20 2022 05/08/20 0453 05/09/20 0506  NA 141 142 144  K 4.5 4.6 3.6  CL 106 113* 116*  CO2 24 20* 23  GLUCOSE 110* 130* 108*  BUN 16 14 8   CREATININE 1.23* 1.09* 0.87  CALCIUM 9.6 8.4* 8.5*   GFR: Estimated Creatinine Clearance: 49.9 mL/min (by C-G formula based on SCr of 0.87 mg/dL). Liver Function Tests: Recent Labs  Lab 05/07/20 2022  AST 36  ALT 15  ALKPHOS 73  BILITOT 1.5*  PROT 7.9  ALBUMIN 4.3   Recent Labs  Lab 05/07/20 2022  LIPASE 27   No results for input(s): AMMONIA in the last 168 hours. Coagulation Profile: No results for input(s): INR, PROTIME in the last 168 hours. Cardiac Enzymes: No results for input(s): CKTOTAL, CKMB, CKMBINDEX, TROPONINI in the last 168 hours. BNP (last 3 results) No results for input(s): PROBNP in the last 8760 hours. HbA1C: No results for input(s): HGBA1C in the last 72 hours. CBG: No results for input(s): GLUCAP in the last 168 hours. Lipid Profile: No results for input(s): CHOL, HDL, LDLCALC, TRIG, CHOLHDL, LDLDIRECT in the last 72 hours. Thyroid Function Tests: No results for input(s): TSH, T4TOTAL, FREET4, T3FREE, THYROIDAB in the last 72 hours. Anemia Panel: No results for input(s): VITAMINB12, FOLATE, FERRITIN, TIBC, IRON, RETICCTPCT in the last 72 hours. Sepsis Labs: No results for input(s): PROCALCITON, LATICACIDVEN in the last 168 hours.  Recent Results (from the past 240 hour(s))  SARS Coronavirus 2 by RT PCR (hospital order, performed in [facility] hospital lab) Nasopharyngeal Nasopharyngeal Swab     Status: None   Collection Time: 05/07/20 11:30  PM   Specimen: Nasopharyngeal Swab  Result Value Ref Range Status   SARS Coronavirus 2 NEGATIVE NEGATIVE Final    Comment: (NOTE) SARS-CoV-2 target nucleic acids are NOT DETECTED.  The SARS-CoV-2 RNA is generally detectable in upper and lower respiratory specimens during the acute phase of infection. The lowest concentration of SARS-CoV-2 viral copies this assay can detect is 250 copies / mL. A negative result does not preclude SARS-CoV-2 infection and should not be used as the sole basis for treatment or other patient management decisions.  A negative result may occur with improper specimen collection / handling, submission of specimen other than nasopharyngeal swab, presence of viral mutation(s) within the areas targeted by this assay, and inadequate number of viral copies (<250 copies / mL). A negative result must be combined with clinical observations, patient history, and epidemiological information.  Fact Sheet for Patients:   StrictlyIdeas.no  Fact Sheet for Healthcare Providers: BankingDealers.co.za  This test is not yet approved or  cleared  by the [place] and has been authorized for detection and/or diagnosis of SARS-CoV-2 by FDA under an Emergency Use Authorization (EUA).  This EUA will remain in effect (meaning this test can be used) for the duration of the COVID-19 declaration under Section 564(b)(1) of the Act, 21 U.S.C. section 360bbb-3(b)(1), unless the authorization is terminated or revoked sooner.  Performed at [facility], [place] [address], [place], [place] [address]      Radiology Studies: CT ABDOMEN PELVIS W CONTRAST  Result Date: 05/07/2020 CLINICAL DATA:  Upper abdominal pain. Vomiting. Bowel obstruction suspected. EXAM: CT ABDOMEN AND PELVIS WITH CONTRAST TECHNIQUE: Multidetector CT imaging of the abdomen and pelvis was performed using the standard protocol following bolus  administration of intravenous contrast. CONTRAST:  164mL OMNIPAQUE IOHEXOL 300 MG/ML  SOLN COMPARISON:  07/25/2015 FINDINGS: Lower chest: Minimal perifissural thickening of the minor fissure, not previously included in the field of view. Subsegmental atelectasis in the lingula. No pleural fluid. Hepatobiliary: No focal liver abnormality is seen. Punctate hepatic granuloma. Status post cholecystectomy. No biliary dilatation. Pancreas: No ductal dilatation or inflammation. Stable pancreatic lipoma versus invagination of peripancreatic fat involving the uncinate process. This is of no clinical significance. Spleen: Normal in size without focal abnormality. Adrenals/Urinary Tract: Normal adrenal glands. No hydronephrosis or perinephric edema. Homogeneous renal enhancement with symmetric excretion on delayed phase imaging. Urinary bladder is partially distended without wall thickening. Stomach/Bowel: Fluid distended stomach. Fluid distends the distal esophagus with hiatal hernia. Dilated fluid-filled proximal small bowel. Transition point to nondilated in the anterior central abdomen, series 2, image 60 and series 4, image 40. There is no small bowel pneumatosis, wall thickening or inflammation. More distal small bowel are nondilated. Appendix not visualized, history of appendectomy. Minimal liquid stool throughout the colon. Scattered diverticulosis from the splenic flexure distally. There is no diverticulitis. Vascular/Lymphatic: Aorto bi-iliac atherosclerosis. No aortic aneurysm. Patent portal vein. Mild haziness at the root of the small bowel mesentery, chronic. No abdominopelvic adenopathy. Reproductive: Status post hysterectomy. No adnexal masses. Other: Small amount of free fluid in the pelvis. No free air or focal fluid collection. There is a tiny fat containing umbilical hernia. Fat in the left inguinal canal. Musculoskeletal: There are no acute or suspicious osseous abnormalities. Multilevel degenerative change  in the spine. IMPRESSION: 1. Small-bowel obstruction with transition point in the anterior central abdomen, likely due to adhesions. 2. Colonic diverticulosis without diverticulitis. Aortic Atherosclerosis (ICD10-I70.0). Electronically Signed   By: Keith Rake M.D.   On: 05/07/2020 22:39   DG Abd Portable 1V-Small Bowel Obstruction Protocol-initial, 8 hr delay  Result Date: 05/08/2020 CLINICAL DATA:  Small bowel obstruction EXAM: PORTABLE ABDOMEN - 1 VIEW COMPARISON:  05/07/2020 FINDINGS: NG tube tip is in the stomach. Oral contrast material is seen throughout the decompressed colon. No current small bowel dilatation. No free air. IMPRESSION: Improved/resolved small bowel obstruction pattern. Contrast material seen within decompressed colon. Electronically Signed   By: Rolm Baptise M.D.   On: 05/08/2020 20:52   DG Abd Portable 1 View  Result Date: 05/08/2020 CLINICAL DATA:  Enteric catheter placement EXAM: PORTABLE ABDOMEN - 1 VIEW COMPARISON:  05/07/2020 FINDINGS: Supine frontal view of the abdomen and pelvis demonstrates enteric catheter passing below diaphragm, tip and side port projecting over gastric fundus. Mild gaseous distention of the small bowel again noted. Excreted contrast within the bladder. Multilevel lumbar spondylosis unchanged. IMPRESSION: 1. Enteric catheter overlying gastric fundus. 2. Continued gaseous distention of the small bowel. Electronically Signed  By: Randa Ngo M.D.   On: 05/08/2020 00:07   Scheduled Meds: . pantoprazole (PROTONIX) IV  40 mg Intravenous QHS  . sodium chloride flush  3 mL Intravenous Once   Continuous Infusions: . dextrose 5 % and 0.9% NaCl 100 mL/hr at 05/08/20 2036     LOS: 2 days   Time spent: 54min  Terril Amaro C Caelan Atchley, DO [facility]  If 7PM-7AM, please contact night-coverage [email or web address]  05/09/2020, 12:09 PM

## 2020-05-09 NOTE — Progress Notes (Signed)
[facility] Progress Note     Subjective: Patient had several loose BMs overnight. Denies nausea, bloating, or abdominal pain this AM. NGT is making throat sore.   Objective: Vital signs in last 24 hours: Temp:  [98.1 F (36.7 C)-99.5 F (37.5 C)] 99.5 F (37.5 C) (07/01 0521) Pulse Rate:  [62-72] 62 (07/01 0521) Resp:  [17-19] 19 (07/01 0521) BP: (136-170)/(75-95) 158/75 (07/01 0521) SpO2:  [98 %-100 %] 98 % (07/01 0521) Last BM Date: 05/08/20  Intake/Output from previous day: 06/30 0701 - 07/01 0700 In: 2387.7 [I.V.:2387.7] Out: 2000 [Urine:1200; Emesis/NG output:800] Intake/Output this shift: Total I/O In: -  Out: 250 [Urine:250]  PE: General: pleasant, WD, overweight female who is laying in bed in NAD Heart: regular, rate, and rhythm.  Normal s1,s2. No obvious murmurs, gallops, or rubs noted.  Palpable radial and pedal pulses bilaterally Lungs: CTAB, no wheezes, rhonchi, or rales noted.  Respiratory effort nonlabored Abd: soft, NT, ND, +BS MS: all 4 extremities are symmetrical with no cyanosis, clubbing, or edema. Skin: warm and dry with no masses, lesions, or rashes    Lab Results:  Recent Labs    05/08/20 0705 05/09/20 0506  WBC 6.5 7.5  HGB 11.7* 11.8*  HCT 35.3* 37.9  PLT 187 179   BMET Recent Labs    05/08/20 0453 05/09/20 0506  NA 142 144  K 4.6 3.6  CL 113* 116*  CO2 20* 23  GLUCOSE 130* 108*  BUN 14 8  CREATININE 1.09* 0.87  CALCIUM 8.4* 8.5*   PT/INR No results for input(s): LABPROT, INR in the last 72 hours. CMP     Component Value Date/Time   NA 144 05/09/2020 0506   K 3.6 05/09/2020 0506   CL 116 (H) 05/09/2020 0506   CO2 23 05/09/2020 0506   GLUCOSE 108 (H) 05/09/2020 0506   BUN 8 05/09/2020 0506   CREATININE 0.87 05/09/2020 0506   CREATININE 1.16 (H) 08/13/2016 1009   CALCIUM 8.5 (L) 05/09/2020 0506   PROT 7.9 05/07/2020 2022   ALBUMIN 4.3 05/07/2020 2022   AST 36 05/07/2020 2022   ALT 15 05/07/2020 2022    ALKPHOS 73 05/07/2020 2022   BILITOT 1.5 (H) 05/07/2020 2022   GFRNONAA >60 05/09/2020 0506   GFRNONAA 58 (L) 06/11/2014 0953   GFRAA >60 05/09/2020 0506   GFRAA 66 06/11/2014 0953   Lipase     Component Value Date/Time   LIPASE 27 05/07/2020 2022       Studies/Results: CT ABDOMEN PELVIS W CONTRAST  Result Date: 05/07/2020 CLINICAL DATA:  Upper abdominal pain. Vomiting. Bowel obstruction suspected. EXAM: CT ABDOMEN AND PELVIS WITH CONTRAST TECHNIQUE: Multidetector CT imaging of the abdomen and pelvis was performed using the standard protocol following bolus administration of intravenous contrast. CONTRAST:  150mL OMNIPAQUE IOHEXOL 300 MG/ML  SOLN COMPARISON:  07/25/2015 FINDINGS: Lower chest: Minimal perifissural thickening of the minor fissure, not previously included in the field of view. Subsegmental atelectasis in the lingula. No pleural fluid. Hepatobiliary: No focal liver abnormality is seen. Punctate hepatic granuloma. Status post cholecystectomy. No biliary dilatation. Pancreas: No ductal dilatation or inflammation. Stable pancreatic lipoma versus invagination of peripancreatic fat involving the uncinate process. This is of no clinical significance. Spleen: Normal in size without focal abnormality. Adrenals/Urinary Tract: Normal adrenal glands. No hydronephrosis or perinephric edema. Homogeneous renal enhancement with symmetric excretion on delayed phase imaging. Urinary bladder is partially distended without wall thickening. Stomach/Bowel: Fluid distended stomach. Fluid distends the distal esophagus with hiatal  hernia. Dilated fluid-filled proximal small bowel. Transition point to nondilated in the anterior central abdomen, series 2, image 60 and series 4, image 40. There is no small bowel pneumatosis, wall thickening or inflammation. More distal small bowel are nondilated. Appendix not visualized, history of appendectomy. Minimal liquid stool throughout the colon. Scattered  diverticulosis from the splenic flexure distally. There is no diverticulitis. Vascular/Lymphatic: Aorto bi-iliac atherosclerosis. No aortic aneurysm. Patent portal vein. Mild haziness at the root of the small bowel mesentery, chronic. No abdominopelvic adenopathy. Reproductive: Status post hysterectomy. No adnexal masses. Other: Small amount of free fluid in the pelvis. No free air or focal fluid collection. There is a tiny fat containing umbilical hernia. Fat in the left inguinal canal. Musculoskeletal: There are no acute or suspicious osseous abnormalities. Multilevel degenerative change in the spine. IMPRESSION: 1. Small-bowel obstruction with transition point in the anterior central abdomen, likely due to adhesions. 2. Colonic diverticulosis without diverticulitis. Aortic Atherosclerosis (ICD10-I70.0). Electronically Signed   By: Keith Rake M.D.   On: 05/07/2020 22:39   DG Abd Portable 1V-Small Bowel Obstruction Protocol-initial, 8 hr delay  Result Date: 05/08/2020 CLINICAL DATA:  Small bowel obstruction EXAM: PORTABLE ABDOMEN - 1 VIEW COMPARISON:  05/07/2020 FINDINGS: NG tube tip is in the stomach. Oral contrast material is seen throughout the decompressed colon. No current small bowel dilatation. No free air. IMPRESSION: Improved/resolved small bowel obstruction pattern. Contrast material seen within decompressed colon. Electronically Signed   By: Rolm Baptise M.D.   On: 05/08/2020 20:52   DG Abd Portable 1 View  Result Date: 05/08/2020 CLINICAL DATA:  Enteric catheter placement EXAM: PORTABLE ABDOMEN - 1 VIEW COMPARISON:  05/07/2020 FINDINGS: Supine frontal view of the abdomen and pelvis demonstrates enteric catheter passing below diaphragm, tip and side port projecting over gastric fundus. Mild gaseous distention of the small bowel again noted. Excreted contrast within the bladder. Multilevel lumbar spondylosis unchanged. IMPRESSION: 1. Enteric catheter overlying gastric fundus. 2. Continued  gaseous distention of the small bowel. Electronically Signed   By: Randa Ngo M.D.   On: 05/08/2020 00:07    Anti-infectives: Anti-infectives (From admission, onward)   None       Assessment/Plan Chronic constipation Esophageal stricture with dilatation -GI: Dr. Henrene Pastor GERD   SBO Hx SBO 2011, Hx appendectomy, cholecystectomy, abdominal hysterectomy - 8h film with contrast throughout colon - having multiple loose stools - NGT removed in room, start CLD - advance to FLD tonight if tolerating - likely able to d/c on soft diet tomorrow if continuing to do well - no indication for surgical intervention at this time  FEN: CLD ID: None DVT: SCDs   LOS: 2 days    Norm Parcel , [facility] 05/09/2020, 9:16 AM Please see Amion for pager number during day hours 7:00am-4:30pm

## 2020-05-10 LAB — CBC
HCT: 32 % — ABNORMAL LOW (ref 36.0–46.0)
Hemoglobin: 10.4 g/dL — ABNORMAL LOW (ref 12.0–15.0)
MCH: 26.9 pg (ref 26.0–34.0)
MCHC: 32.5 g/dL (ref 30.0–36.0)
MCV: 82.9 fL (ref 80.0–100.0)
Platelets: 161 10*3/uL (ref 150–400)
RBC: 3.86 MIL/uL — ABNORMAL LOW (ref 3.87–5.11)
RDW: 14.8 % (ref 11.5–15.5)
WBC: 7.6 10*3/uL (ref 4.0–10.5)
nRBC: 0 % (ref 0.0–0.2)

## 2020-05-10 LAB — BASIC METABOLIC PANEL
Anion gap: 6 (ref 5–15)
BUN: <5 mg/dL — ABNORMAL LOW (ref 8–23)
CO2: 24 mmol/L (ref 22–32)
Calcium: 8.1 mg/dL — ABNORMAL LOW (ref 8.9–10.3)
Chloride: 112 mmol/L — ABNORMAL HIGH (ref 98–111)
Creatinine, Ser: 0.9 mg/dL (ref 0.44–1.00)
GFR calc Af Amer: >60 mL/min (ref >60)
GFR calc non Af Amer: 60 mL/min — ABNORMAL LOW (ref >60)
Glucose, Bld: 101 mg/dL — ABNORMAL HIGH (ref 70–99)
Potassium: 3.3 mmol/L — ABNORMAL LOW (ref 3.5–5.1)
Sodium: 142 mmol/L (ref 135–145)

## 2020-05-10 MED ORDER — ACETAMINOPHEN 325 MG PO TABS
650.0000 mg | ORAL_TABLET | Freq: Four times a day (QID) | ORAL | Status: DC | PRN
  Administered 2020-05-10: 650 mg via ORAL
  Filled 2020-05-10: qty 2

## 2020-05-10 MED ORDER — SENOKOT EXTRA STRENGTH 17.2 MG PO TABS: 1.0000 | ORAL_TABLET | Freq: Every day | ORAL | 0 refills | Status: DC

## 2020-05-10 NOTE — Discharge Instructions (Signed)

## 2020-05-10 NOTE — Progress Notes (Signed)
    CC: Abdominal pain Subjective: She is doing better.  She complained of a bad night with sinus problems and a mild headache.  She is tolerating full liquids and having bowel movements.  She has issues with chronic constipation, and we talked about high-fiber diet and treatment of chronic constipation issues.  Objective: Vital signs in last 24 hours: Temp:  [98.5 F (36.9 C)-98.7 F (37.1 C)] 98.7 F (37.1 C) (07/02 0608) Pulse Rate:  [55-58] 55 (07/02 0608) Resp:  [16-18] 16 (07/02 0608) BP: (136-180)/(72-86) 147/77 (07/02 0608) SpO2:  [98 %-100 %] 98 % (07/02 0608) Last BM Date: 05/09/20 1440 PO 2361 IV 1550 urine Stool x 4 Afebile, VSS K+ 3.3, ? Mag will check WBC 7.6  Intake/Output from previous day: 07/01 0701 - 07/02 0700 In: 3801.7 [P.O.:1440; I.V.:2361.7] Out: 1550 [Urine:1550] Intake/Output this shift: No intake/output data recorded.  General appearance: alert, cooperative and no distress Resp: clear to auscultation bilaterally GI: soft, non-tender; bowel sounds normal; no masses,  no organomegaly  Lab Results:  Recent Labs    05/09/20 0506 05/10/20 0429  WBC 7.5 7.6  HGB 11.8* 10.4*  HCT 37.9 32.0*  PLT 179 161    BMET Recent Labs    05/09/20 0506 05/10/20 0429  NA 144 142  K 3.6 3.3*  CL 116* 112*  CO2 23 24  GLUCOSE 108* 101*  BUN 8 <5*  CREATININE 0.87 0.90  CALCIUM 8.5* 8.1*   PT/INR No results for input(s): LABPROT, INR in the last 72 hours.  Recent Labs  Lab 05/07/20 2022  AST 36  ALT 15  ALKPHOS 73  BILITOT 1.5*  PROT 7.9  ALBUMIN 4.3     Lipase     Component Value Date/Time   LIPASE 27 05/07/2020 2022     Medications: . pantoprazole (PROTONIX) IV  40 mg Intravenous QHS  . sodium chloride flush  3 mL Intravenous Once    Assessment/Plan Chronic constipation Esophageal stricture with dilatation-GI: Dr. Henrene Pastor GERD   SBO Hx SBO 2011, Hx appendectomy, cholecystectomy, abdominal hysterectomy - 8h film with  contrast throughout colon - having multiple loose stools - NGT removed in room, start CLD - advance to FLD tonight if tolerating - likely able to d/c on soft diet tomorrow if continuing to do well - no indication for surgical intervention at this time  FEN: full liquids ID: None DVT: SCDs   Plan: Soft diet, if she tolerates that she can be discharged home from our standpoint.  I placed some information in the discharge instructions on good bowel health.  Please call if we can be of further assistance.  LOS: 3 days    Burr Soffer 05/10/2020 Please see Amion

## 2020-05-10 NOTE — Progress Notes (Signed)
Discharge instructions given to pt and all questions were answered.  

## 2020-05-10 NOTE — Discharge Summary (Signed)
Physician Discharge Summary  Emily Bentley UXL:[id number] DOB: Feb 14, 1935 DOA: 05/07/2020  PCP: [name], MD  Admit date: 05/07/2020 Discharge date: 05/10/2020  Admitted From: Home Disposition: Home  Recommendations for Outpatient Follow-up:  1. Follow up with PCP in 1-2 weeks 2. Please obtain BMP/CBC in one week  Discharge Condition: Stable CODE STATUS: Full Diet recommendation: Soft bland diet as tolerated, advance back to regular diet as discussed  Brief/Interim Summary: Emily Mcmath [place] a 85 y.o.femalewithhistory of hearing impairment, GERD with esophageal stricture diagnosis requiring dilation in April 2021 on PPI with history of chronic constipation has been experiencing abdominal pain for the last 2 days initially started in the lower abdomen then became more diffuse. Denies nausea vomiting. Last bowel movement was yesterday after patient took some magnesium citrate. Given the worsening pain patient presents to the ED. In the ED patient had a CT abdomen pelvis which shows features consistent with small bowel obstruction with transition point likely due to adhesions. ED physician discussed with on-call general surgeon Dr. Ninfa Linden who advised NG tube and will be seeing patient in consult. Covid test was negative. Patient's labs are significant for creatinine around 1.2 which is increased from 0.05 WBC 11.  Patient here as above with acute intractable nausea vomiting abdominal pain concerning for partial small bowel obstruction versus complete obstruction.  Thankfully patient symptoms resolved drastically with supportive care, IV fluids and NG tube placement.  At this time patient is able to tolerate full liquid diet without any difficulties or concerns, has had multiple episodes of flatus as well as bowel movement now with no ongoing pain or nausea or vomiting.  Patient will have close follow-up with PCP as well as GI in the next 1 to 2 weeks as scheduled.  Otherwise  patient is stable and agreeable for discharge home, family at bedside agrees patient looks much improved essentially back to baseline.Marland Kitchen  Discharge Diagnoses:  Principal Problem:   SBO (small bowel obstruction) (HCC) Active Problems:   Small bowel obstruction [facility])    Discharge Instructions  Discharge Instructions    Call MD for:  difficulty breathing, headache or visual disturbances   Complete by: As directed    Call MD for:  extreme fatigue   Complete by: As directed    Call MD for:  hives   Complete by: As directed    Call MD for:  persistant dizziness or light-headedness   Complete by: As directed    Call MD for:  persistant nausea and vomiting   Complete by: As directed    Call MD for:  severe uncontrolled pain   Complete by: As directed    Call MD for:  temperature >100.4   Complete by: As directed    Diet - low sodium heart healthy   Complete by: As directed    Increase activity slowly   Complete by: As directed      Allergies as of 05/10/2020      Reactions   Sulfamethoxazole-trimethoprim       Medication List    TAKE these medications   acetaminophen 650 MG CR tablet Commonly known as: TYLENOL Take 650 mg by mouth every 8 (eight) hours as needed for pain.   ALPRAZolam 0.5 MG tablet Commonly known as: XANAX Take 1 tablet (0.5 mg total) by mouth at bedtime as needed for anxiety.   calcium-vitamin D 250-125 MG-UNIT tablet Commonly known as: OSCAL WITH D Take 1 tablet by mouth daily.   cetirizine 5 MG tablet Commonly  known as: ZYRTEC Take 5 mg by mouth daily.   cholecalciferol 1000 units tablet Commonly known as: VITAMIN D Take 2,000 Units by mouth daily.   esomeprazole 20 MG capsule Commonly known as: NEXIUM Take 20 mg by mouth daily at 12 noon.   Linzess 145 MCG Caps capsule Generic drug: linaclotide Take 145 mcg by mouth daily as needed (cramping).   magnesium hydroxide 400 MG/5ML suspension Commonly known as: MILK OF MAGNESIA Take 30 mLs by  mouth daily as needed for mild constipation.   Senokot Extra Strength 17.2 MG Tabs Generic drug: Sennosides Take 1 tablet (17.2 mg total) by mouth daily. What changed:   how much to take  when to take this  reasons to take this   traMADol 50 MG tablet Commonly known as: ULTRAM TAKE 1/2 TO 1 TABLET BY MOUTH TWICE DAILY What changed:   how much to take  how to take this  when to take this  reasons to take this  additional instructions       Allergies  Allergen Reactions  . Sulfamethoxazole-Trimethoprim     Consultations:  General surgery   Procedures/Studies: CT ABDOMEN PELVIS W CONTRAST  Result Date: 05/07/2020 CLINICAL DATA:  Upper abdominal pain. Vomiting. Bowel obstruction suspected. EXAM: CT ABDOMEN AND PELVIS WITH CONTRAST TECHNIQUE: Multidetector CT imaging of the abdomen and pelvis was performed using the standard protocol following bolus administration of intravenous contrast. CONTRAST:  170mL OMNIPAQUE IOHEXOL 300 MG/ML  SOLN COMPARISON:  07/25/2015 FINDINGS: Lower chest: Minimal perifissural thickening of the minor fissure, not previously included in the field of view. Subsegmental atelectasis in the lingula. No pleural fluid. Hepatobiliary: No focal liver abnormality is seen. Punctate hepatic granuloma. Status post cholecystectomy. No biliary dilatation. Pancreas: No ductal dilatation or inflammation. Stable pancreatic lipoma versus invagination of peripancreatic fat involving the uncinate process. This is of no clinical significance. Spleen: Normal in size without focal abnormality. Adrenals/Urinary Tract: Normal adrenal glands. No hydronephrosis or perinephric edema. Homogeneous renal enhancement with symmetric excretion on delayed phase imaging. Urinary bladder is partially distended without wall thickening. Stomach/Bowel: Fluid distended stomach. Fluid distends the distal esophagus with hiatal hernia. Dilated fluid-filled proximal small bowel. Transition  point to nondilated in the anterior central abdomen, series 2, image 60 and series 4, image 40. There is no small bowel pneumatosis, wall thickening or inflammation. More distal small bowel are nondilated. Appendix not visualized, history of appendectomy. Minimal liquid stool throughout the colon. Scattered diverticulosis from the splenic flexure distally. There is no diverticulitis. Vascular/Lymphatic: Aorto bi-iliac atherosclerosis. No aortic aneurysm. Patent portal vein. Mild haziness at the root of the small bowel mesentery, chronic. No abdominopelvic adenopathy. Reproductive: Status post hysterectomy. No adnexal masses. Other: Small amount of free fluid in the pelvis. No free air or focal fluid collection. There is a tiny fat containing umbilical hernia. Fat in the left inguinal canal. Musculoskeletal: There are no acute or suspicious osseous abnormalities. Multilevel degenerative change in the spine. IMPRESSION: 1. Small-bowel obstruction with transition point in the anterior central abdomen, likely due to adhesions. 2. Colonic diverticulosis without diverticulitis. Aortic Atherosclerosis (ICD10-I70.0). Electronically Signed   By: Keith Rake M.D.   On: 05/07/2020 22:39   DG Abd Portable 1V-Small Bowel Obstruction Protocol-initial, 8 hr delay  Result Date: 05/08/2020 CLINICAL DATA:  Small bowel obstruction EXAM: PORTABLE ABDOMEN - 1 VIEW COMPARISON:  05/07/2020 FINDINGS: NG tube tip is in the stomach. Oral contrast material is seen throughout the decompressed colon. No current small bowel dilatation. No free  air. IMPRESSION: Improved/resolved small bowel obstruction pattern. Contrast material seen within decompressed colon. Electronically Signed   By: Rolm Baptise M.D.   On: 05/08/2020 20:52   DG Abd Portable 1 View  Result Date: 05/08/2020 CLINICAL DATA:  Enteric catheter placement EXAM: PORTABLE ABDOMEN - 1 VIEW COMPARISON:  05/07/2020 FINDINGS: Supine frontal view of the abdomen and pelvis  demonstrates enteric catheter passing below diaphragm, tip and side port projecting over gastric fundus. Mild gaseous distention of the small bowel again noted. Excreted contrast within the bladder. Multilevel lumbar spondylosis unchanged. IMPRESSION: 1. Enteric catheter overlying gastric fundus. 2. Continued gaseous distention of the small bowel. Electronically Signed   By: Randa Ngo M.D.   On: 05/08/2020 00:07      Subjective: No acute issues or events overnight, tolerating p.o. quite well now with no longer complaining of abdominal pain, nausea, vomiting, diarrhea, constipation.   Discharge Exam: Vitals:   05/09/20 2210 05/10/20 0608  BP: (!) 180/86 (!) 147/77  Pulse: (!) 58 (!) 55  Resp: 17 16  Temp: 98.5 F (36.9 C) 98.7 F (37.1 C)  SpO2: 98% 98%   Vitals:   05/09/20 0521 05/09/20 1323 05/09/20 2210 05/10/20 0608  BP: (!) 158/75 136/72 (!) 180/86 (!) 147/77  Pulse: 62 (!) 55 (!) 58 (!) 55  Resp: 19 18 17 16   Temp: 99.5 F (37.5 C) 98.5 F (36.9 C) 98.5 F (36.9 C) 98.7 F (37.1 C)  TempSrc: Oral Oral Oral Oral  SpO2: 98% 100% 98% 98%  Weight:      Height:        General: Pt is alert, awake, not in acute distress Cardiovascular: RRR, S1/S2 +, no rubs, no gallops Respiratory: CTA bilaterally, no wheezing, no rhonchi Abdominal: Soft, NT, ND, bowel sounds + Extremities: no edema, no cyanosis    The results of significant diagnostics from this hospitalization (including imaging, microbiology, ancillary and laboratory) are listed below for reference.     Microbiology: Recent Results (from the past 240 hour(s))  SARS Coronavirus 2 by RT PCR (hospital order, performed in [facility] hospital lab) Nasopharyngeal Nasopharyngeal Swab     Status: None   Collection Time: 05/07/20 11:30 PM   Specimen: Nasopharyngeal Swab  Result Value Ref Range Status   SARS Coronavirus 2 NEGATIVE NEGATIVE Final    Comment: (NOTE) SARS-CoV-2 target nucleic acids are NOT  DETECTED.  The SARS-CoV-2 RNA is generally detectable in upper and lower respiratory specimens during the acute phase of infection. The lowest concentration of SARS-CoV-2 viral copies this assay can detect is 250 copies / mL. A negative result does not preclude SARS-CoV-2 infection and should not be used as the sole basis for treatment or other patient management decisions.  A negative result may occur with improper specimen collection / handling, submission of specimen other than nasopharyngeal swab, presence of viral mutation(s) within the areas targeted by this assay, and inadequate number of viral copies (<250 copies / mL). A negative result must be combined with clinical observations, patient history, and epidemiological information.  Fact Sheet for Patients:   StrictlyIdeas.no  Fact Sheet for Healthcare Providers: BankingDealers.co.za  This test is not yet approved or  cleared by the [place] FDA and has been authorized for detection and/or diagnosis of SARS-CoV-2 by FDA under an Emergency Use Authorization (EUA).  This EUA will remain in effect (meaning this test can be used) for the duration of the COVID-19 declaration under Section 564(b)(1) of the Act, 21 U.S.C. section 360bbb-3(b)(1), unless  the authorization is terminated or revoked sooner.  Performed at [facility], [place] [address], [place], [place] [address]      Labs: BNP (last 3 results) No results for input(s): BNP in the last 8760 hours. Basic Metabolic Panel: Recent Labs  Lab 05/07/20 2022 05/08/20 0453 05/09/20 0506 05/10/20 0429  NA 141 142 144 142  K 4.5 4.6 3.6 3.3*  CL 106 113* 116* 112*  CO2 24 20* 23 24  GLUCOSE 110* 130* 108* 101*  BUN 16 14 8  <5*  CREATININE 1.23* 1.09* 0.87 0.90  CALCIUM 9.6 8.4* 8.5* 8.1*   Liver Function Tests: Recent Labs  Lab 05/07/20 2022  AST 36  ALT 15  ALKPHOS 73  BILITOT 1.5*  PROT  7.9  ALBUMIN 4.3   Recent Labs  Lab 05/07/20 2022  LIPASE 27   No results for input(s): AMMONIA in the last 168 hours. CBC: Recent Labs  Lab 05/07/20 2022 05/08/20 0705 05/09/20 0506 05/10/20 0429  WBC 11.0* 6.5 7.5 7.6  HGB 13.9 11.7* 11.8* 10.4*  HCT 42.4 35.3* 37.9 32.0*  MCV 81.7 81.1 85.0 82.9  PLT 214 187 179 161   Cardiac Enzymes: No results for input(s): CKTOTAL, CKMB, CKMBINDEX, TROPONINI in the last 168 hours. BNP: Invalid input(s): POCBNP CBG: No results for input(s): GLUCAP in the last 168 hours. D-Dimer No results for input(s): DDIMER in the last 72 hours. Hgb A1c No results for input(s): HGBA1C in the last 72 hours. Lipid Profile No results for input(s): CHOL, HDL, LDLCALC, TRIG, CHOLHDL, LDLDIRECT in the last 72 hours. Thyroid function studies No results for input(s): TSH, T4TOTAL, T3FREE, THYROIDAB in the last 72 hours.  Invalid input(s): FREET3 Anemia work up No results for input(s): VITAMINB12, FOLATE, FERRITIN, TIBC, IRON, RETICCTPCT in the last 72 hours. Urinalysis    Component Value Date/Time   COLORURINE YELLOW 05/07/2020 [place] 05/07/2020 1954   LABSPEC 1.008 05/07/2020 1954   PHURINE 9.0 (H) 05/07/2020 1954   GLUCOSEU NEGATIVE 05/07/2020 1954   GLUCOSEU NEGATIVE 05/04/2016 1514   HGBUR SMALL (A) 05/07/2020 1954   HGBUR small 10/09/2009 [place] 05/07/2020 1954   BILIRUBINUR NEg 08/22/2018 1358   [place] 05/07/2020 1954   PROTEINUR NEGATIVE 05/07/2020 1954   UROBILINOGEN 0.2 08/22/2018 1358   UROBILINOGEN 0.2 05/04/2016 1514   NITRITE NEGATIVE 05/07/2020 1954   LEUKOCYTESUR TRACE (A) 05/07/2020 1954   Sepsis Labs Invalid input(s): PROCALCITONIN,  WBC,  LACTICIDVEN Microbiology Recent Results (from the past 240 hour(s))  SARS Coronavirus 2 by RT PCR (hospital order, performed in [place] hospital lab) Nasopharyngeal Nasopharyngeal Swab     Status: None   Collection Time: 05/07/20  11:30 PM   Specimen: Nasopharyngeal Swab  Result Value Ref Range Status   SARS Coronavirus 2 NEGATIVE NEGATIVE Final    Comment: (NOTE) SARS-CoV-2 target nucleic acids are NOT DETECTED.  The SARS-CoV-2 RNA is generally detectable in upper and lower respiratory specimens during the acute phase of infection. The lowest concentration of SARS-CoV-2 viral copies this assay can detect is 250 copies / mL. A negative result does not preclude SARS-CoV-2 infection and should not be used as the sole basis for treatment or other patient management decisions.  A negative result may occur with improper specimen collection / handling, submission of specimen other than nasopharyngeal swab, presence of viral mutation(s) within the areas targeted by this assay, and inadequate number of viral copies (<250 copies / mL). A negative result  must be combined with clinical observations, patient history, and epidemiological information.  Fact Sheet for Patients:   StrictlyIdeas.no  Fact Sheet for Healthcare Providers: BankingDealers.co.za  This test is not yet approved or  cleared by the [place] FDA and has been authorized for detection and/or diagnosis of SARS-CoV-2 by FDA under an Emergency Use Authorization (EUA).  This EUA will remain in effect (meaning this test can be used) for the duration of the COVID-19 declaration under Section 564(b)(1) of the Act, 21 U.S.C. section 360bbb-3(b)(1), unless the authorization is terminated or revoked sooner.  Performed at [facility], [place] [address] [place], [place] [address]      Time coordinating discharge: Over 30 minutes  SIGNED:   Little Ishikawa, DO [facility] 05/10/2020, 5:18 PM Pager   If 7PM-7AM, please contact night-coverage [email or web address]

## 2020-05-24 ENCOUNTER — Other Ambulatory Visit: Payer: Self-pay

## 2020-05-24 ENCOUNTER — Encounter: Payer: Self-pay | Admitting: Internal Medicine

## 2020-05-24 ENCOUNTER — Ambulatory Visit (INDEPENDENT_AMBULATORY_CARE_PROVIDER_SITE_OTHER): Payer: Medicare Other | Admitting: Internal Medicine

## 2020-05-24 VITALS — BP 152/84 | HR 68 | Temp 98.2°F | Ht 63.5 in | Wt 170.0 lb

## 2020-05-24 DIAGNOSIS — K56609 Unspecified intestinal obstruction, unspecified as to partial versus complete obstruction: Secondary | ICD-10-CM | POA: Diagnosis not present

## 2020-05-24 DIAGNOSIS — R1310 Dysphagia, unspecified: Secondary | ICD-10-CM | POA: Diagnosis not present

## 2020-05-24 DIAGNOSIS — J0110 Acute frontal sinusitis, unspecified: Secondary | ICD-10-CM

## 2020-05-24 MED ORDER — DOXYCYCLINE HYCLATE 100 MG PO TABS
100.0000 mg | ORAL_TABLET | Freq: Two times a day (BID) | ORAL | 0 refills | Status: DC
Start: 2020-05-24 — End: 2020-06-14

## 2020-05-24 NOTE — Patient Instructions (Signed)
We have sent in doxycycline to take 1 pill twice a day for 1 week.  We are checking the labs today also.  High-Fiber Diet Fiber, also called dietary fiber, is a type of carbohydrate that is found in fruits, vegetables, whole grains, and beans. A high-fiber diet can have many health benefits. Your health care provider may recommend a high-fiber diet to help:  Prevent constipation. Fiber can make your bowel movements more regular.  Lower your cholesterol.  Relieve the following conditions: ? Swelling of veins in the anus (hemorrhoids). ? Swelling and irritation (inflammation) of specific areas of the digestive tract (uncomplicated diverticulosis). ? A problem of the large intestine (colon) that sometimes causes pain and diarrhea (irritable bowel syndrome, IBS).  Prevent overeating as part of a weight-loss plan.  Prevent heart disease, type 2 diabetes, and certain cancers. What is my plan? The recommended daily fiber intake in grams (g) includes:  38 g for men age 4 or younger.  30 g for men over age 45.  40 g for women age 65 or younger.  21 g for women over age 76. You can get the recommended daily intake of dietary fiber by:  Eating a variety of fruits, vegetables, grains, and beans.  Taking a fiber supplement, if it is not possible to get enough fiber through your diet. What do I need to know about a high-fiber diet?  It is better to get fiber through food sources rather than from fiber supplements. There is not a lot of research about how effective supplements are.  Always check the fiber content on the nutrition facts label of any prepackaged food. Look for foods that contain 5 g of fiber or more per serving.  Talk with a diet and nutrition specialist (dietitian) if you have questions about specific foods that are recommended or not recommended for your medical condition, especially if those foods are not listed below.  Gradually increase how much fiber you consume. If  you increase your intake of dietary fiber too quickly, you may have bloating, cramping, or gas.  Drink plenty of water. Water helps you to digest fiber. What are tips for following this plan?  Eat a wide variety of high-fiber foods.  Make sure that half of the grains that you eat each day are whole grains.  Eat breads and cereals that are made with whole-grain flour instead of refined flour or white flour.  Eat brown rice, bulgur wheat, or millet instead of white rice.  Start the day with a breakfast that is high in fiber, such as a cereal that contains 5 g of fiber or more per serving.  Use beans in place of meat in soups, salads, and pasta dishes.  Eat high-fiber snacks, such as berries, raw vegetables, nuts, and popcorn.  Choose whole fruits and vegetables instead of processed forms like juice or sauce. What foods can I eat?  Fruits Berries. Pears. Apples. Oranges. Avocado. Prunes and raisins. Dried figs. Vegetables Sweet potatoes. Spinach. Kale. Artichokes. Cabbage. Broccoli. Cauliflower. Green peas. Carrots. Squash. Grains Whole-grain breads. Multigrain cereal. Oats and oatmeal. Brown rice. Barley. Bulgur wheat. [place]. Quinoa. Bran muffins. Popcorn. Rye wafer crackers. Meats and other proteins Navy, kidney, and pinto beans. Soybeans. Split peas. Lentils. Nuts and seeds. Dairy Fiber-fortified yogurt. Beverages Fiber-fortified soy milk. Fiber-fortified orange juice. Other foods Fiber bars. The items listed above may not be a complete list of recommended foods and beverages. Contact a dietitian for more options. What foods are not recommended? Fruits Fruit  juice. Cooked, strained fruit. Vegetables Fried potatoes. Canned vegetables. Well-cooked vegetables. Grains White bread. Pasta made with refined flour. White rice. Meats and other proteins Fatty cuts of meat. Fried chicken or fried fish. Dairy Milk. Yogurt. Cream cheese. Sour cream. Fats and  oils Butters. Beverages Soft drinks. Other foods Cakes and pastries. The items listed above may not be a complete list of foods and beverages to avoid. Contact a dietitian for more information. Summary  Fiber is a type of carbohydrate. It is found in fruits, vegetables, whole grains, and beans.  There are many health benefits of eating a high-fiber diet, such as preventing constipation, lowering blood cholesterol, helping with weight loss, and reducing your risk of heart disease, diabetes, and certain cancers.  Gradually increase your intake of fiber. Increasing too fast can result in cramping, bloating, and gas. Drink plenty of water while you increase your fiber.  The best sources of fiber include whole fruits and vegetables, whole grains, nuts, seeds, and beans. This information is not intended to replace advice given to you by your health care provider. Make sure you discuss any questions you have with your health care provider. Document Revised: 08/30/2017 Document Reviewed: 08/30/2017 Elsevier Patient Education  2020 [employer].

## 2020-05-24 NOTE — Progress Notes (Signed)
   Subjective:   Patient ID: Emily Bentley, female    DOB: [birth year], 85 y.o.   MRN: [id number]  HPI The patient is an 85 YO female coming in for hospital follow up (in for SBO, using bathroom most days since being home, she is increasing fiber to help, drinking plenty of fluids, taking linzess daily and trying to figure out appropriate timing so she can still leave house, denies nausea or vomiting or abdominal pain or bloating, still eating mostly soft foods and limited normal foods again) and sinus problems (started about 1-2 weeks ago, having some dizziness and sinus pressure, denies fevers or chills, denies significant nasal drainage but overall feeling like sinus infection, still taking zyrtec daily but this is not helping as it usually does, has cochlear implant and sounds are very loud lately) and dysphagia (returned to GI as instructed, had endoscopy with dilation since our last visit, is swallowing much better, denies food getting stuck lately).   PMH, [facility], social history reviewed and updated  Review of Systems  Constitutional: Negative.   HENT: Positive for congestion, ear pain, sinus pressure and sinus pain. Negative for postnasal drip, rhinorrhea, sore throat and trouble swallowing.   Eyes: Negative.   Respiratory: Positive for chest tightness. Negative for cough and shortness of breath.   Cardiovascular: Negative for chest pain, palpitations and leg swelling.  Gastrointestinal: Negative for abdominal distention, abdominal pain, constipation, diarrhea, nausea and vomiting.  Musculoskeletal: Negative.   Skin: Negative.   Neurological: Negative.   Psychiatric/Behavioral: Negative.     Objective:  Physical Exam Constitutional:      Appearance: She is well-developed.  HENT:     Head: Normocephalic and atraumatic.     Comments: Oropharynx with redness, nose with swollen turbinates, ears with bulging TMs clear fluid bilateral Cardiovascular:     Rate and Rhythm: Normal rate and  regular rhythm.  Pulmonary:     Effort: Pulmonary effort is normal. No respiratory distress.     Breath sounds: Normal breath sounds. No wheezing or rales.  Abdominal:     General: Bowel sounds are normal. There is no distension.     Palpations: Abdomen is soft.     Tenderness: There is no abdominal tenderness. There is no rebound.  Musculoskeletal:     Cervical back: Normal range of motion.  Skin:    General: Skin is warm and dry.  Neurological:     Mental Status: She is alert and oriented to person, place, and time.     Coordination: Coordination normal.     Vitals:   05/24/20 1019  BP: (!) 152/84  Pulse: 68  Temp: 98.2 F (36.8 C)  TempSrc: Oral  SpO2: 97%  Weight: 170 lb (77.1 kg)  Height: 5' 3.5" (1.613 m)    This visit occurred during the SARS-CoV-2 public health emergency.  Safety protocols were in place, including screening questions prior to the visit, additional usage of staff PPE, and extensive cleaning of exam room while observing appropriate contact time as indicated for disinfecting solutions.   Assessment & Plan:

## 2020-05-24 NOTE — Addendum Note (Signed)
Addended by: Pricilla Holm A on: 05/24/2020 12:47 PM   Modules accepted: Orders

## 2020-05-24 NOTE — Assessment & Plan Note (Signed)
Overall improved since dilation and she will remain on nexium for life.

## 2020-05-24 NOTE — Assessment & Plan Note (Signed)
Rx doxycycline for sinus infection.

## 2020-05-24 NOTE — Assessment & Plan Note (Signed)
Ordered CBC and CMP for any changes. Advised to continue linzess daily and senokot. Monitor closely for recurrence and advance diet as tolerated. Given information about fiber sources.

## 2020-05-25 LAB — CBC
HCT: 40.6 % (ref 35.0–45.0)
Hemoglobin: 13.1 g/dL (ref 11.7–15.5)
MCH: 27 pg (ref 27.0–33.0)
MCHC: 32.3 g/dL (ref 32.0–36.0)
MCV: 83.5 fL (ref 80.0–100.0)
MPV: 9.6 fL (ref 7.5–12.5)
Platelets: 278 10*3/uL (ref 140–400)
RBC: 4.86 10*6/uL (ref 3.80–5.10)
RDW: 14.2 % (ref 11.0–15.0)
WBC: 5.8 10*3/uL (ref 3.8–10.8)

## 2020-05-25 LAB — COMPREHENSIVE METABOLIC PANEL
AG Ratio: 1.2 (calc) (ref 1.0–2.5)
ALT: 9 U/L (ref 6–29)
AST: 24 U/L (ref 10–35)
Albumin: 3.9 g/dL (ref 3.6–5.1)
Alkaline phosphatase (APISO): 73 U/L (ref 37–153)
BUN/Creatinine Ratio: 11 (calc) (ref 6–22)
BUN: 11 mg/dL (ref 7–25)
CO2: 27 mmol/L (ref 20–32)
Calcium: 9.7 mg/dL (ref 8.6–10.4)
Chloride: 105 mmol/L (ref 98–110)
Creat: 1.03 mg/dL — ABNORMAL HIGH (ref 0.60–0.88)
Globulin: 3.2 g/dL (calc) (ref 1.9–3.7)
Glucose, Bld: 82 mg/dL (ref 65–99)
Potassium: 4.6 mmol/L (ref 3.5–5.3)
Sodium: 141 mmol/L (ref 135–146)
Total Bilirubin: 1.1 mg/dL (ref 0.2–1.2)
Total Protein: 7.1 g/dL (ref 6.1–8.1)

## 2020-06-14 ENCOUNTER — Ambulatory Visit
Admission: EM | Admit: 2020-06-14 | Discharge: 2020-06-14 | Disposition: A | Discharge status: Home / Self Care | Payer: Medicare Other | Attending: Physician Assistant | Admitting: Physician Assistant

## 2020-06-14 DIAGNOSIS — J3489 Other specified disorders of nose and nasal sinuses: Secondary | ICD-10-CM

## 2020-06-14 DIAGNOSIS — H938X3 Other specified disorders of ear, bilateral: Secondary | ICD-10-CM

## 2020-06-14 MED ORDER — AZELASTINE HCL 0.1 % NA SOLN
2.0000 | Freq: Two times a day (BID) | NASAL | 0 refills | Status: DC
Start: 2020-06-14 — End: 2021-01-17

## 2020-06-14 MED ORDER — FLUTICASONE PROPIONATE 50 MCG/ACT NA SUSP
2.0000 | Freq: Every day | NASAL | 0 refills | Status: DC
Start: 2020-06-14 — End: 2021-01-17

## 2020-06-14 MED ORDER — PREDNISONE 50 MG PO TABS
50.0000 mg | ORAL_TABLET | Freq: Every day | ORAL | 0 refills | Status: DC
Start: 2020-06-14 — End: 2020-08-20

## 2020-06-14 NOTE — ED Triage Notes (Signed)
Pt c/o sinus congestion/pain/pressure, ear pressure/pain bilaterally, runny nose, fatigue, cough, mild SOB for approx 3 weeks. Pt also reports dizziness that she attributes to her ear congestion. Pt has left cochlear implant. Pt states she had GI obstruction the end of June resolved with GI decompression and congestion began at that time and she was tx with 7 days antibiotics, but sinus congestion, runny nose has persisted. Pt states she was unable to take her daily laxative yesterday 2/2 URI symptoms.  Denies fever, v/d, extremity weakness, slurred speech, change in vision.

## 2020-06-14 NOTE — Discharge Instructions (Signed)
No alarming signs on exam. Prednisone for sinus pressure. Start flonase, azelastine nasal spray for nasal congestion/drainage. You can use over the counter nasal saline rinse such as neti pot for nasal congestion. Keep hydrated, your urine should be clear to pale yellow in color. Tylenol/motrin for fever and pain. Monitor for any worsening of symptoms, chest pain, shortness of breath, one sided weakness, passing out, fever, go to the ED for further evaluation. Otherwise, follow up with PCP if symptoms not improving.

## 2020-06-14 NOTE — ED Provider Notes (Signed)
[name]    [name]: [id number] Arrival date & time: 06/14/20  0903      History   Chief Complaint Chief Complaint  Patient presents with   Nasal Congestion    HPI Emily Bentley is a 85 y.o. female.   85 year old female comes in for continued sinus pressure, ear fullness, post nasal drip after being seen by PCP 05/24/2020 for the same. Started on doxycycline, which improved symptoms slightly. Since then, symptoms returned. She reports dizziness, for which she describes as "changes in equilibrium", worse with head movement. Denies fall, syncope. Denies one sided weakness, confusion, slurred speech, changes in vision. Denies fever.      Past Medical History:  Diagnosis Date   Allergy    Arthritis    Asthma    Cataract    Chronic allergic rhinitis    with asthmatic component   Diverticulosis of colon (without mention of hemorrhage)    GERD (gastroesophageal reflux disease)    Hearing impairment    Hiatal hernia    History of bursitis    right shoulder   History of cochlear implant    Cant have an MRI   History of colon polyps    History of pneumonia    History of PSVT (paroxysmal supraventricular tachycardia)    Hormone replacement therapy (postmenopausal)    IBS (irritable bowel syndrome)    Peptic stricture of esophagus    Watermelon stomach     Patient Active Problem List   Diagnosis Date Noted   SBO (small bowel obstruction) ([place]) 05/07/2020   Small bowel obstruction ([place]) 05/07/2020   Dysphagia 01/04/2020   Chronic bilateral thoracic back pain 09/28/2019   Scalp cyst 09/28/2019   Acute sinusitis 09/28/2019   Allergic rhinitis 09/28/2019   Constipation 08/25/2018   Elevated blood pressure reading in office without diagnosis of hypertension 08/25/2018   Anxiety 02/04/2018   Degenerative disc disease, lumbar 09/16/2015   Routine general medical examination at a health care facility 07/02/2013   OAB (overactive  bladder) 05/25/2012   OSTEOPENIA 10/09/2009   Cochlear implant in place 12/30/2007   PSVT 12/30/2007   GERD 12/30/2007    Past Surgical History:  Procedure Laterality Date   ABDOMINAL HYSTERECTOMY     APPENDECTOMY     CHOLECYSTECTOMY     COCHLEAR IMPLANT Left Oct '14   Done at [facility] - great results / Serial No: [id number] / Model: CI422   ESOPHAGOGASTRODUODENOSCOPY     EYE SURGERY     POLYPECTOMY     TUBAL LIGATION     urethral dilatations     VENTRICULAR ABLATION SURGERY      OB History   No obstetric history on file.      Home Medications    Prior to Admission medications   Medication Sig Start Date End Date Taking? Authorizing Provider  cetirizine (ZYRTEC) 5 MG tablet Take 5 mg by mouth daily.   Yes [provider]  esomeprazole (NEXIUM) 20 MG capsule Take 20 mg by mouth daily at 12 noon.   Yes [provider]  linaclotide (LINZESS) 145 MCG CAPS capsule Take 145 mcg by mouth daily as needed (cramping).    Yes [provider]  magnesium hydroxide (MILK OF MAGNESIA) 400 MG/5ML suspension Take 30 mLs by mouth daily as needed for mild constipation.   Yes [provider]  Sennosides (SENOKOT EXTRA STRENGTH) 17.2 MG TABS Take 1 tablet (17.2 mg total) by mouth daily. 05/10/20  Yes Avon Gully,  Luanna Cole, MD  acetaminophen (TYLENOL) 650 MG CR tablet Take 650 mg by mouth every 8 (eight) hours as needed for pain.    [provider]  ALPRAZolam Duanne Moron) 0.5 MG tablet Take 1 tablet (0.5 mg total) by mouth at bedtime as needed for anxiety. 02/03/18   [name], MD  azelastine (ASTELIN) 0.1 % nasal spray Place 2 sprays into both nostrils 2 (two) times daily. 06/14/20   Tasia Catchings, Sung Parodi V, PA-C  fluticasone (FLONASE) 50 MCG/ACT nasal spray Place 2 sprays into both nostrils daily. 06/14/20   Tasia Catchings, Kelle Ruppert V, PA-C  multivitamin-iron-minerals-folic acid (CENTRUM) chewable tablet Chew 1 tablet by mouth daily.    [provider]  predniSONE (DELTASONE) 50 MG tablet Take 1 tablet (50 mg total) by mouth daily with breakfast. 06/14/20   Tasia Catchings, Kaitlin Alcindor V, PA-C  traMADol (ULTRAM) 50 MG tablet TAKE 1/2 TO 1 TABLET BY MOUTH TWICE DAILY Patient taking differently: Take 50 mg by mouth every 12 (twelve) hours as needed for moderate pain.  05/02/20   [name], MD    Family History Family History  Problem Relation Age of Onset   Diabetes Mother    Uterine cancer Mother    Coronary artery disease Father        CABG x 5 in 1985   Hypertension Father    Colon cancer Neg Hx     Social History Social History   Tobacco Use   Smoking status: Never Smoker   Smokeless tobacco: Never Used  Scientific laboratory technician Use: Never used  Substance Use Topics   Alcohol use: No   Drug use: No     Allergies   Sulfamethoxazole-trimethoprim   Review of Systems Review of Systems  Reason unable to perform ROS: See HPI as above.     Physical Exam Triage Vital Signs ED Triage Vitals  Enc Vitals Group     BP 06/14/20 0935 (!) 148/95     Pulse Rate 06/14/20 0935 81     Resp 06/14/20 0935 18     Temp 06/14/20 0935 98 F (36.7 C)     Temp Source 06/14/20 0935 Oral     SpO2 06/14/20 0935 97 %     Weight --      Height --      Head Circumference --      Peak Flow --      Pain Score 06/14/20 0947 2     Pain Loc --      Pain Edu? --      Excl. in [place]? --    No data found.  Updated Vital Signs BP (!) 148/95 (BP Location: Left Arm)    Pulse 81    Temp 98 F (36.7 C) (Oral)    Resp 18    SpO2 97%   Physical Exam Constitutional:      General: She is not in acute distress.    Appearance: Normal appearance. She is well-developed. She is not ill-appearing, toxic-appearing or diaphoretic.  HENT:     Head: Normocephalic and atraumatic.     Right Ear: Tympanic membrane, ear canal and external ear normal. Tympanic membrane is not erythematous or bulging.     Left Ear: Tympanic membrane, ear canal and  external ear normal. Tympanic membrane is not erythematous or bulging.     Nose:     Right Sinus: No maxillary sinus tenderness or frontal sinus tenderness.     Left Sinus: No maxillary sinus tenderness  or frontal sinus tenderness.     Mouth/Throat:     Mouth: Mucous membranes are moist.     Pharynx: Oropharynx is clear. Uvula midline.  Eyes:     Extraocular Movements: Extraocular movements intact.     Conjunctiva/sclera: Conjunctivae normal.     Pupils: Pupils are equal, round, and reactive to light.  Cardiovascular:     Rate and Rhythm: Normal rate and regular rhythm.  Pulmonary:     Effort: Pulmonary effort is normal. No accessory muscle usage, prolonged expiration, respiratory distress or retractions.     Breath sounds: No decreased air movement or transmitted upper airway sounds. No decreased breath sounds.     Comments: LCTAB Musculoskeletal:     Cervical back: Normal range of motion and neck supple.  Skin:    General: Skin is warm and dry.  Neurological:     Mental Status: She is alert and oriented to person, place, and time.     Comments: Cranial nerves II-XII grossly intact. Strength 5/5 bilaterally for upper and lower extremity. Sensation intact. Normal coordination with normal finger to nose, heel to shin. Negative pronator drift, romberg. Gait intact. Able to ambulate on own without difficulty.       UC Treatments / Results  Labs (all labs ordered are listed, but only abnormal results are displayed) Labs Reviewed - No data to display  EKG   Radiology No results found.  Procedures Procedures (including critical care time)  Medications Ordered in UC Medications - No data to display  Initial Impression / Assessment and Plan / UC Course  I have reviewed the triage vital signs and the nursing notes.  Pertinent labs & imaging results that were available during my care of the patient were reviewed by me and considered in my medical decision making (see chart for  details).    Stable vitals. Grossly intact neurology exam. Patient able to ambulate on own without difficulty. Will treat for eustachian tube dysfunction and sinus pressure with prednisone and nasal sprays. Return precautions given. Otherwise to follow up with PCP for recheck if symptoms not improving. Patient expresses understanding and agrees to plan.  Final Clinical Impressions(s) / UC Diagnoses   Final diagnoses:  Sinus pressure  Sensation of fullness in both ears   ED Prescriptions    Medication Sig Dispense Auth. Provider   predniSONE (DELTASONE) 50 MG tablet Take 1 tablet (50 mg total) by mouth daily with breakfast. 5 tablet Dariyon Urquilla V, PA-C   fluticasone (FLONASE) 50 MCG/ACT nasal spray Place 2 sprays into both nostrils daily. 1 g Mubarak Bevens V, PA-C   azelastine (ASTELIN) 0.1 % nasal spray Place 2 sprays into both nostrils 2 (two) times daily. 30 mL Ok Edwards, PA-C     PDMP not reviewed this encounter.   Ok Edwards, PA-C 06/14/20 1116

## 2020-06-19 DIAGNOSIS — L7211 Pilar cyst: Secondary | ICD-10-CM | POA: Diagnosis not present

## 2020-07-23 DIAGNOSIS — L7211 Pilar cyst: Secondary | ICD-10-CM | POA: Diagnosis not present

## 2020-07-23 DIAGNOSIS — L720 Epidermal cyst: Secondary | ICD-10-CM | POA: Diagnosis not present

## 2020-08-19 NOTE — Progress Notes (Signed)
Subjective:    Patient ID: Emily Bentley, female    DOB: [birth year], 85 y.o.   MRN: [id number]  HPI The patient is here for an acute visit.   Hurting in spine and ribs causing breast pain - her pain is in the upper part of her back into her collar bones.  Even her jaw hurts - just her lower jaw, but no TMJ.  Her breasts are even tingling.  All this started 6-7 months.  Her mammogram was normal.    She can take tylenol or aleve pain and muscle reliever and it helps for 4-5 hours.  She stays cold.  She does not have an appetite.  She gets tired.  She has less stamina.    Her upper arms and legs are sore.    She denies falls.     Her sister has multiple myeloma.  There has been increased stress.    Previous thoracic spine x-ray showed degenerative disc disease-multilevel.  Medications and allergies reviewed with patient and updated if appropriate.  Patient Active Problem List   Diagnosis Date Noted  . SBO (small bowel obstruction) ([place]) 05/07/2020  . Small bowel obstruction ([place]) 05/07/2020  . Dysphagia 01/04/2020  . Chronic bilateral thoracic back pain 09/28/2019  . Scalp cyst 09/28/2019  . Acute sinusitis 09/28/2019  . Allergic rhinitis 09/28/2019  . Constipation 08/25/2018  . Elevated blood pressure reading in office without diagnosis of hypertension 08/25/2018  . Anxiety 02/04/2018  . Degenerative disc disease, lumbar 09/16/2015  . Routine general medical examination at a health care facility 07/02/2013  . OAB (overactive bladder) 05/25/2012  . OSTEOPENIA 10/09/2009  . Cochlear implant in place 12/30/2007  . PSVT 12/30/2007  . GERD 12/30/2007    Current Outpatient Medications on File Prior to Visit  Medication Sig Dispense Refill  . acetaminophen (TYLENOL) 650 MG CR tablet Take 650 mg by mouth every 8 (eight) hours as needed for pain.    Marland Kitchen ALPRAZolam (XANAX) 0.5 MG tablet Take 1 tablet (0.5 mg total) by mouth at bedtime as needed for anxiety. 30 tablet 0  .  azelastine (ASTELIN) 0.1 % nasal spray Place 2 sprays into both nostrils 2 (two) times daily. 30 mL 0  . cetirizine (ZYRTEC) 5 MG tablet Take 5 mg by mouth daily.    Marland Kitchen esomeprazole (NEXIUM) 20 MG capsule Take 20 mg by mouth daily at 12 noon.    . fluticasone (FLONASE) 50 MCG/ACT nasal spray Place 2 sprays into both nostrils daily. 1 g 0  . linaclotide (LINZESS) 145 MCG CAPS capsule Take 145 mcg by mouth daily as needed (cramping).     . magnesium hydroxide (MILK OF MAGNESIA) 400 MG/5ML suspension Take 30 mLs by mouth daily as needed for mild constipation.    . multivitamin-iron-minerals-folic acid (CENTRUM) chewable tablet Chew 1 tablet by mouth daily.    . Sennosides (SENOKOT EXTRA STRENGTH) 17.2 MG TABS Take 1 tablet (17.2 mg total) by mouth daily. 30 tablet 0  . traMADol (ULTRAM) 50 MG tablet TAKE 1/2 TO 1 TABLET BY MOUTH TWICE DAILY (Patient taking differently: Take 50 mg by mouth every 12 (twelve) hours as needed for moderate pain. ) 30 tablet 4  . predniSONE (DELTASONE) 50 MG tablet Take 1 tablet (50 mg total) by mouth daily with breakfast. (Patient not taking: Reported on 08/20/2020) 5 tablet 0   No current facility-administered medications on file prior to visit.    Past Medical History:  Diagnosis Date  . Allergy   .  Arthritis   . Asthma   . Cataract   . Chronic allergic rhinitis    with asthmatic component  . Diverticulosis of colon (without mention of hemorrhage)   . GERD (gastroesophageal reflux disease)   . Hearing impairment   . Hiatal hernia   . History of bursitis    right shoulder  . History of cochlear implant    Cant have an MRI  . History of colon polyps   . History of pneumonia   . History of PSVT (paroxysmal supraventricular tachycardia)   . Hormone replacement therapy (postmenopausal)   . IBS (irritable bowel syndrome)   . Peptic stricture of esophagus   . Watermelon stomach     Past Surgical History:  Procedure Laterality Date  . ABDOMINAL HYSTERECTOMY     . APPENDECTOMY    . CHOLECYSTECTOMY    . COCHLEAR IMPLANT Left Oct '14   Done at [facility] - great results / Serial No: [id number] / Model: CI422  . ESOPHAGOGASTRODUODENOSCOPY    . EYE SURGERY    . POLYPECTOMY    . TUBAL LIGATION    . urethral dilatations    . VENTRICULAR ABLATION SURGERY      Social History   Socioeconomic History  . Marital status: Widowed    Spouse name: Not on file  . Number of children: 4  . Years of education: Not on file  . Highest education level: Not on file  Occupational History  . Occupation: retired    Fish farm manager: [employer]  Tobacco Use  . Smoking status: Never Smoker  . Smokeless tobacco: Never Used  Vaping Use  . Vaping Use: Never used  Substance and Sexual Activity  . Alcohol use: No  . Drug use: No  . Sexual activity: Not Currently  Other Topics Concern  . 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 Difficulty of Paying Living Expenses: Not on file  Food Insecurity:   . Worried About Charity fundraiser in the Last Year: Not on file  . Ran Out of Food in the Last Year: Not on file  Transportation Needs:   . Lack of Transportation (Medical): Not on file  . Lack of Transportation (Non-Medical): Not on file  Physical Activity:   . Days of Exercise per Week: Not on file  . Minutes of Exercise per Session: Not on file  Stress:   . Feeling of Stress : Not on file  Social Connections:   . Frequency of Communication with Friends and Family: Not on file  . Frequency of Social Gatherings with Friends and Family: Not on file  . Attends Religious Services: Not on file  . Active Member of Clubs or Organizations: Not on file  . Attends Archivist Meetings:  Not on file  . Marital Status: Not on file    Family History  Problem Relation Age of Onset  . Diabetes Mother   . Uterine cancer Mother   . Coronary artery disease Father        CABG x 5 in 1985  . Hypertension Father   . Colon cancer Neg Hx     Review of Systems  Constitutional:  Positive for appetite change (decreased appetite), fatigue, fever and unexpected weight change (from not eating).  HENT: Positive for postnasal drip (allergies).   Respiratory: Positive for cough (from allergies) and shortness of breath (new). Negative for wheezing.   Cardiovascular: Positive for palpitations (occ). Negative for chest pain and leg swelling.  Endocrine: Positive for cold intolerance.  Musculoskeletal: Positive for back pain and myalgias (arms, legs, upper joints). Negative for arthralgias (right knee only).       Lower jaw pain  Neurological: Positive for dizziness (occ), light-headedness and headaches (allergy related - occasional). Negative for numbness (tingling in breasts, sometimes in arms).       Objective:   Vitals:   08/20/20 1023  BP: 130/80  Pulse: 77  Temp: 98.1 F (36.7 C)  SpO2: 97%   BP Readings from Last 3 Encounters:  08/20/20 130/80  06/14/20 (!) 148/95  05/24/20 (!) 152/84   Wt Readings from Last 3 Encounters:  08/20/20 164 lb (74.4 kg)  05/24/20 170 lb (77.1 kg)  05/07/20 172 lb (78 kg)   Body mass index is 28.6 kg/m.   Physical Exam Constitutional:      General: She is not in acute distress.    Appearance: Normal appearance. She is not ill-appearing.  HENT:     Head: Normocephalic and atraumatic.  Cardiovascular:     Rate and Rhythm: Normal rate and regular rhythm.  Pulmonary:     Effort: Pulmonary effort is normal. No respiratory distress.     Breath sounds: No wheezing or rales.  Musculoskeletal:        General: Tenderness (upper back, arms, legs with palpation) present. No swelling.     Cervical back: Neck supple. No tenderness.    Lymphadenopathy:     Cervical: No cervical adenopathy.  Skin:    General: Skin is warm and dry.  Neurological:     General: No focal deficit present.     Mental Status: She is alert.            Assessment & Plan:    See Problem List for Assessment and Plan of chronic medical problems.    This visit occurred during the SARS-CoV-2 public health emergency.  Safety protocols were in place, including screening questions prior to the visit, additional usage of staff PPE, and extensive cleaning of exam room while observing appropriate contact time as indicated for disinfecting solutions.

## 2020-08-20 ENCOUNTER — Ambulatory Visit (INDEPENDENT_AMBULATORY_CARE_PROVIDER_SITE_OTHER): Payer: Medicare Other | Admitting: Internal Medicine

## 2020-08-20 ENCOUNTER — Encounter: Payer: Self-pay | Admitting: Internal Medicine

## 2020-08-20 ENCOUNTER — Ambulatory Visit (INDEPENDENT_AMBULATORY_CARE_PROVIDER_SITE_OTHER): Payer: Medicare Other

## 2020-08-20 ENCOUNTER — Other Ambulatory Visit: Payer: Self-pay

## 2020-08-20 VITALS — BP 130/80 | HR 77 | Temp 98.1°F | Ht 63.5 in | Wt 164.0 lb

## 2020-08-20 DIAGNOSIS — M898X9 Other specified disorders of bone, unspecified site: Secondary | ICD-10-CM

## 2020-08-20 DIAGNOSIS — M546 Pain in thoracic spine: Secondary | ICD-10-CM | POA: Diagnosis not present

## 2020-08-20 DIAGNOSIS — E559 Vitamin D deficiency, unspecified: Secondary | ICD-10-CM

## 2020-08-20 DIAGNOSIS — R5383 Other fatigue: Secondary | ICD-10-CM

## 2020-08-20 DIAGNOSIS — K5900 Constipation, unspecified: Secondary | ICD-10-CM

## 2020-08-20 DIAGNOSIS — F419 Anxiety disorder, unspecified: Secondary | ICD-10-CM | POA: Diagnosis not present

## 2020-08-20 DIAGNOSIS — M791 Myalgia, unspecified site: Secondary | ICD-10-CM | POA: Diagnosis not present

## 2020-08-20 DIAGNOSIS — R202 Paresthesia of skin: Secondary | ICD-10-CM

## 2020-08-20 DIAGNOSIS — M47814 Spondylosis without myelopathy or radiculopathy, thoracic region: Secondary | ICD-10-CM | POA: Diagnosis not present

## 2020-08-20 LAB — COMPREHENSIVE METABOLIC PANEL
ALT: 12 U/L (ref 0–35)
AST: 25 U/L (ref 0–37)
Albumin: 3.9 g/dL (ref 3.5–5.2)
Alkaline Phosphatase: 63 U/L (ref 39–117)
BUN: 11 mg/dL (ref 6–23)
CO2: 28 mEq/L (ref 19–32)
Calcium: 9.5 mg/dL (ref 8.4–10.5)
Chloride: 106 mEq/L (ref 96–112)
Creatinine, Ser: 1.03 mg/dL (ref 0.40–1.20)
GFR: 50.29 mL/min — ABNORMAL LOW (ref >60.00)
Glucose, Bld: 88 mg/dL (ref 70–99)
Potassium: 4 mEq/L (ref 3.5–5.1)
Sodium: 140 mEq/L (ref 135–145)
Total Bilirubin: 1.3 mg/dL — ABNORMAL HIGH (ref 0.2–1.2)
Total Protein: 7.2 g/dL (ref 6.0–8.3)

## 2020-08-20 LAB — CBC WITH DIFFERENTIAL/PLATELET
Basophils Absolute: 0.1 10*3/uL (ref 0.0–0.1)
Basophils Relative: 1 % (ref 0.0–3.0)
Eosinophils Absolute: 0.1 10*3/uL (ref 0.0–0.7)
Eosinophils Relative: 1.6 % (ref 0.0–5.0)
HCT: 40.7 % (ref 36.0–46.0)
Hemoglobin: 13.3 g/dL (ref 12.0–15.0)
Lymphocytes Relative: 30.2 % (ref 12.0–46.0)
Lymphs Abs: 1.9 10*3/uL (ref 0.7–4.0)
MCHC: 32.6 g/dL (ref 30.0–36.0)
MCV: 81.7 fl (ref 78.0–100.0)
Monocytes Absolute: 0.5 10*3/uL (ref 0.1–1.0)
Monocytes Relative: 7.9 % (ref 3.0–12.0)
Neutro Abs: 3.8 10*3/uL (ref 1.4–7.7)
Neutrophils Relative %: 59.3 % (ref 43.0–77.0)
Platelets: 214 10*3/uL (ref 150.0–400.0)
RBC: 4.98 Mil/uL (ref 3.87–5.11)
RDW: 14.7 % (ref 11.5–15.5)
WBC: 6.4 10*3/uL (ref 4.0–10.5)

## 2020-08-20 LAB — VITAMIN B12: Vitamin B-12: 470 pg/mL (ref 211–911)

## 2020-08-20 LAB — TSH: TSH: 1.9 u[IU]/mL (ref 0.35–4.50)

## 2020-08-20 LAB — SEDIMENTATION RATE: Sed Rate: 12 mm/hr (ref 0–30)

## 2020-08-20 LAB — VITAMIN D 25 HYDROXY (VIT D DEFICIENCY, FRACTURES): VITD: 39.92 ng/mL (ref 30.00–100.00)

## 2020-08-20 LAB — C-REACTIVE PROTEIN: CRP: <1 mg/dL (ref 0.5–20.0)

## 2020-08-20 MED ORDER — LINACLOTIDE 145 MCG PO CAPS: 145.0000 ug | ORAL_CAPSULE | Freq: Every day | ORAL | 5 refills | Status: DC | PRN

## 2020-08-20 MED ORDER — ALPRAZOLAM 0.5 MG PO TABS: 0.5000 mg | ORAL_TABLET | Freq: Every evening | ORAL | 0 refills | Status: DC | PRN

## 2020-08-20 NOTE — Assessment & Plan Note (Signed)
Acute Has tingling in her breasts and sometimes in her arms ?  Related to radiculopathy We will check B12 level Trial of Tylenol 3 times daily to see if that helps

## 2020-08-20 NOTE — Assessment & Plan Note (Signed)
Chronic She is taking vitamin D daily-?  Amount Check vitamin D level

## 2020-08-20 NOTE — Assessment & Plan Note (Signed)
Acute We will recheck vitamin D, B12, CBC and CMP She has not been exercise and regularly and stressed regular exercise Stress could be contributing Follow-up if there is no improvement

## 2020-08-20 NOTE — Assessment & Plan Note (Signed)
Chronic Controlled with Linzess as long she takes it on a regular basis Continue Linzess 145 mcg daily-refill provided today

## 2020-08-20 NOTE — Assessment & Plan Note (Signed)
Acute States muscle or bone pain -she states it feels like her bones No joint pain except her right knee, no joint swelling We will check ANA, ESR, CRP, vitamin D level Advised taking Tylenol 3 times daily, Aleve only for more severe pain or tramadol for more severe pain

## 2020-08-20 NOTE — Assessment & Plan Note (Signed)
Chronic, intermittent Take Xanax at bedtime as needed-0.5 mg at bedtime as needed She has had increased anxiety recently and feels she needs to continue this Dr. Sharlet Salina has discussed risks of medication with her in the past Refilled today

## 2020-08-20 NOTE — Assessment & Plan Note (Signed)
Acute She is unsure whether she is having bone pain or muscle pain She has not been exercising regularly and has had some increased stress, with both of which could be contributing Check CBC, CMP, CRP, ANA, ESR, TSH and vitamin D level Tylenol 3 times daily, takes tramadol for more severe pain

## 2020-08-20 NOTE — Assessment & Plan Note (Signed)
Acute problem, but she has had T-spine pain in the past Xray in the past showed DDD ? Muscular or arthritis related, possibly with some radiculopathy t-spine xary today Tylenol as needed, takes 1/2 tramadol for more severe pain  - both help

## 2020-08-20 NOTE — Patient Instructions (Addendum)
  Blood work was ordered.  Have an xray downstairs of your back.     Medications reviewed and updated.  Changes include :   none  Your prescription(s) have been submitted to your pharmacy. Please take as directed and contact our office if you believe you are having problem(s) with the medication(s).    Please call or return if there is no improvement in your symptoms.

## 2020-08-23 LAB — ANTI-NUCLEAR AB-TITER (ANA TITER)
ANA TITER: 1:40 {titer} — ABNORMAL HIGH
ANA Titer 1: 1:80 {titer} — ABNORMAL HIGH

## 2020-08-23 LAB — ANA: Anti Nuclear Antibody (ANA): POSITIVE — AB

## 2020-08-26 ENCOUNTER — Other Ambulatory Visit: Payer: Self-pay | Admitting: Internal Medicine

## 2020-08-26 MED ORDER — LINACLOTIDE 145 MCG PO CAPS: 145.0000 ug | ORAL_CAPSULE | Freq: Every day | ORAL | 5 refills | Status: DC | PRN

## 2020-09-16 DIAGNOSIS — H353131 Nonexudative age-related macular degeneration, bilateral, early dry stage: Secondary | ICD-10-CM | POA: Diagnosis not present

## 2020-09-16 DIAGNOSIS — H43813 Vitreous degeneration, bilateral: Secondary | ICD-10-CM | POA: Diagnosis not present

## 2020-09-16 DIAGNOSIS — H02889 Meibomian gland dysfunction of unspecified eye, unspecified eyelid: Secondary | ICD-10-CM | POA: Diagnosis not present

## 2020-09-16 DIAGNOSIS — Z961 Presence of intraocular lens: Secondary | ICD-10-CM | POA: Diagnosis not present

## 2020-09-23 DIAGNOSIS — Z23 Encounter for immunization: Secondary | ICD-10-CM | POA: Diagnosis not present

## 2020-10-14 ENCOUNTER — Telehealth: Payer: Self-pay | Admitting: Internal Medicine

## 2020-10-14 NOTE — Telephone Encounter (Signed)
[facility]  Caller has rash around bikini line on stomach (started 9 days ago). Pt. also has some burning and feels like there is a little ball at the opening of her vagina (started 2 months ago). Rash is red and itchy. Pt. has used Monistat and it helps. No trouble urinating. No bleeding.  [facility] advised: SEE PCP WITHIN 3 DAYS  Patient understood and complied.   Patient is going to make a appt. with OB/GYN

## 2020-10-14 NOTE — Telephone Encounter (Signed)
Tried to make mother a GYN appointment, nothing this week or next  Stated if there was a referral from the Provider may be able to get her in sooner  Please contact the granddaughter [name] at [phone] with a response

## 2020-10-14 NOTE — Telephone Encounter (Signed)
Patient called and said she is having a rash spread around her stomach near an old in incision site. Transferred to [facility].

## 2020-10-15 NOTE — Telephone Encounter (Signed)
I called pt- she is requesting OV to  discuss her issues with PCP and determine if she needs a GYN. She also C/o nervousness, dry mouth, jittery and having headaches and "out of control" and believes this may be caused by her xanax. OV scheduled for 10/16/20 @ 10:20.

## 2020-10-15 NOTE — Telephone Encounter (Signed)
I'm not sure she needs routine gyn care, is there an issue she is having?

## 2020-10-16 ENCOUNTER — Ambulatory Visit (INDEPENDENT_AMBULATORY_CARE_PROVIDER_SITE_OTHER): Payer: Medicare Other | Admitting: Internal Medicine

## 2020-10-16 ENCOUNTER — Other Ambulatory Visit: Payer: Self-pay

## 2020-10-16 ENCOUNTER — Encounter: Payer: Self-pay | Admitting: Internal Medicine

## 2020-10-16 ENCOUNTER — Ambulatory Visit (INDEPENDENT_AMBULATORY_CARE_PROVIDER_SITE_OTHER)
Admission: RE | Admit: 2020-10-16 | Discharge: 2020-10-16 | Disposition: A | Discharge status: Home / Self Care | Payer: Medicare Other | Source: Ambulatory Visit | Attending: Internal Medicine | Admitting: Internal Medicine

## 2020-10-16 VITALS — BP 132/80 | HR 72 | Resp 18 | Ht 63.5 in | Wt 161.8 lb

## 2020-10-16 DIAGNOSIS — M8580 Other specified disorders of bone density and structure, unspecified site: Secondary | ICD-10-CM

## 2020-10-16 DIAGNOSIS — Z23 Encounter for immunization: Secondary | ICD-10-CM | POA: Diagnosis not present

## 2020-10-16 DIAGNOSIS — E2839 Other primary ovarian failure: Secondary | ICD-10-CM

## 2020-10-16 DIAGNOSIS — N8110 Cystocele, unspecified: Secondary | ICD-10-CM | POA: Diagnosis not present

## 2020-10-16 DIAGNOSIS — J3089 Other allergic rhinitis: Secondary | ICD-10-CM

## 2020-10-16 MED ORDER — MONTELUKAST SODIUM 10 MG PO TABS
10.0000 mg | ORAL_TABLET | Freq: Every day | ORAL | 3 refills | Status: DC
Start: 2020-10-16 — End: 2022-05-13

## 2020-10-16 MED ORDER — PREDNISONE 20 MG PO TABS
ORAL_TABLET | ORAL | 0 refills | Status: DC
Start: 2020-10-16 — End: 2020-11-04

## 2020-10-16 NOTE — Progress Notes (Signed)
   Subjective:   Patient ID: Emily Bentley, female    DOB: August 06, 1935, 85 y.o.   MRN: [id number]  HPI The patient is an 85 YO female coming in for concerns about vaginal prolapse (feeling some small amount or tissue bulging at times, has constipation and straining causes this, also her chronic drainage which causes intermittent cough can worsen this, it does go back away without intervention, some burning pain associated with this, she is using some otc cream to help with lubrication) and sinus drainage (clear, chronic, worsening gradually over several months, 3 covid-19 shots, taking otc zyrtec and flonase without significant relief anymore), and her osteopenia (needs follow up bone density, denies falls or fracture, not taking otc vitamin d currently, some activity but no formal exercise).   Review of Systems  Constitutional: Negative.   HENT: Positive for congestion and postnasal drip.   Eyes: Negative.   Respiratory: Negative for cough, chest tightness and shortness of breath.   Cardiovascular: Negative for chest pain, palpitations and leg swelling.  Gastrointestinal: Negative for abdominal distention, abdominal pain, constipation, diarrhea, nausea and vomiting.  Musculoskeletal: Negative.   Skin: Negative.   Neurological: Negative.   Psychiatric/Behavioral: Negative.     Objective:  Physical Exam Constitutional:      Appearance: She is well-developed and well-nourished.  HENT:     Head: Normocephalic and atraumatic.     Nose: Rhinorrhea present.  Eyes:     Extraocular Movements: EOM normal.  Cardiovascular:     Rate and Rhythm: Normal rate and regular rhythm.  Pulmonary:     Effort: Pulmonary effort is normal. No respiratory distress.     Breath sounds: Normal breath sounds. No wheezing or rales.  Abdominal:     General: Bowel sounds are normal. There is no distension.     Palpations: Abdomen is soft.     Tenderness: There is no abdominal tenderness. There is no rebound.   Musculoskeletal:        General: No edema.     Cervical back: Normal range of motion.  Skin:    General: Skin is warm and dry.  Neurological:     Mental Status: She is alert and oriented to person, place, and time.     Coordination: Coordination normal.  Psychiatric:        Mood and Affect: Mood and affect normal.     Vitals:   10/16/20 1017  BP: 132/80  Pulse: 72  Resp: 18  SpO2: 96%  Weight: 161 lb 12.8 oz (73.4 kg)  Height: 5' 3.5" (1.613 m)    This visit occurred during the SARS-CoV-2 public health emergency.  Safety protocols were in place, including screening questions prior to the visit, additional usage of staff PPE, and extensive cleaning of exam room while observing appropriate contact time as indicated for disinfecting solutions.   Assessment & Plan:  Flu shot given at visit

## 2020-10-16 NOTE — Patient Instructions (Signed)
We have sent in prednisone to take 2 pills daily for 1 week, then take 1 pill daily until gone.   We have sent in singulair (montelukast) to take 1 pill daily to help with the sinuses.   We are getting the bone density ordered today.

## 2020-10-18 DIAGNOSIS — N8110 Cystocele, unspecified: Secondary | ICD-10-CM | POA: Insufficient documentation

## 2020-10-18 NOTE — Assessment & Plan Note (Signed)
Rx prednisone and singulair. Continue zyrtec and flonase.

## 2020-10-18 NOTE — Assessment & Plan Note (Signed)
Referral to obgyn

## 2020-10-18 NOTE — Assessment & Plan Note (Signed)
Ordered bone density. Treat as appropriate.

## 2020-10-21 DIAGNOSIS — H353131 Nonexudative age-related macular degeneration, bilateral, early dry stage: Secondary | ICD-10-CM | POA: Diagnosis not present

## 2020-10-21 DIAGNOSIS — Z961 Presence of intraocular lens: Secondary | ICD-10-CM | POA: Diagnosis not present

## 2020-10-21 DIAGNOSIS — H26491 Other secondary cataract, right eye: Secondary | ICD-10-CM | POA: Diagnosis not present

## 2020-10-28 DIAGNOSIS — J342 Deviated nasal septum: Secondary | ICD-10-CM | POA: Diagnosis not present

## 2020-10-28 DIAGNOSIS — K219 Gastro-esophageal reflux disease without esophagitis: Secondary | ICD-10-CM | POA: Insufficient documentation

## 2020-10-28 DIAGNOSIS — J309 Allergic rhinitis, unspecified: Secondary | ICD-10-CM | POA: Diagnosis not present

## 2020-10-28 DIAGNOSIS — Z9621 Cochlear implant status: Secondary | ICD-10-CM | POA: Diagnosis not present

## 2020-10-29 DIAGNOSIS — H903 Sensorineural hearing loss, bilateral: Secondary | ICD-10-CM | POA: Diagnosis not present

## 2020-11-04 ENCOUNTER — Encounter: Payer: Self-pay | Admitting: Obstetrics and Gynecology

## 2020-11-04 ENCOUNTER — Other Ambulatory Visit: Payer: Self-pay

## 2020-11-04 ENCOUNTER — Ambulatory Visit (INDEPENDENT_AMBULATORY_CARE_PROVIDER_SITE_OTHER): Payer: Medicare Other | Admitting: Obstetrics and Gynecology

## 2020-11-04 VITALS — BP 116/84 | HR 79 | Ht 61.75 in | Wt 163.4 lb

## 2020-11-04 DIAGNOSIS — N898 Other specified noninflammatory disorders of vagina: Secondary | ICD-10-CM

## 2020-11-04 DIAGNOSIS — N8111 Cystocele, midline: Secondary | ICD-10-CM

## 2020-11-04 DIAGNOSIS — N952 Postmenopausal atrophic vaginitis: Secondary | ICD-10-CM | POA: Diagnosis not present

## 2020-11-04 NOTE — Patient Instructions (Signed)
For vaginal dryness you can use coconut oil or Replens vaginal moisturizer.   Atrophic Vaginitis  Atrophic vaginitis is a condition in which the tissues that line the vagina become dry and thin. This condition is most common in women who have stopped having regular menstrual periods (are in menopause). This usually starts when a woman is 6245-83 years old. That is the time when a woman's estrogen levels begin to drop (decrease). Estrogen is a female hormone. It helps to keep the tissues of the vagina moist. It stimulates the vagina to produce a clear fluid that lubricates the vagina for sexual intercourse. This fluid also protects the vagina from infection. Lack of estrogen can cause the lining of the vagina to get thinner and dryer. The vagina may also shrink in size. It may become less elastic. Atrophic vaginitis tends to get worse over time as a woman's estrogen level drops. What are the causes? This condition is caused by the normal drop in estrogen that happens around the time of menopause. What increases the risk? Certain conditions or situations may lower a woman's estrogen level, leading to a higher risk for atrophic vaginitis. You are more likely to develop this condition if:  You are taking medicines that block estrogen.  You have had your ovaries removed.  You are being treated for cancer with X-ray (radiation) or medicines (chemotherapy).  You have given birth or are breastfeeding.  You are older than age 83.  You smoke. What are the signs or symptoms? Symptoms of this condition include:  Pain, soreness, or bleeding during sexual intercourse (dyspareunia).  Vaginal burning, irritation, or itching.  Pain or bleeding when a speculum is used in a vaginal exam (pelvic exam).  Having burning pain when passing urine.  Vaginal discharge that is brown or yellow. In some cases, there are no symptoms. How is this diagnosed? This condition is diagnosed by taking a medical history  and doing a physical exam. This will include a pelvic exam that checks the vaginal tissues. Though rare, you may also have other tests, including:  A urine test.  A test that checks the acid balance in your vagina (acid balance test). How is this treated? Treatment for this condition depends on how severe your symptoms are. Treatment may include:  Using an over-the-counter vaginal lubricant before sex.  Using a long-acting vaginal moisturizer.  Using low-dose vaginal estrogen for moderate to severe symptoms that do not respond to other treatments. Options include creams, tablets, and inserts (vaginal rings). Before you use a vaginal estrogen, tell your health care provider if you have a history of: ? Breast cancer. ? Endometrial cancer. ? Blood clots. If you are not sexually active and your symptoms are very mild, you may not need treatment. Follow these instructions at home: Medicines  Take over-the-counter and prescription medicines only as told by your health care provider. Do not use herbal or alternative medicines unless your health care provider says that you can.  Use over-the-counter creams, lubricants, or moisturizers for dryness only as directed by your health care provider. General instructions  If your atrophic vaginitis is caused by menopause, discuss all of your menopause symptoms and treatment options with your health care provider.  Do not douche.  Do not use products that can make your vagina dry. These include: ? Scented feminine sprays. ? Scented tampons. ? Scented soaps.  Vaginal intercourse can help to improve blood flow and elasticity of vaginal tissue. If it hurts to have sex, try using a  lubricant or moisturizer just before having intercourse. Contact a health care provider if:  Your discharge looks different than normal.  Your vagina has an unusual smell.  You have new symptoms.  Your symptoms do not improve with treatment.  Your symptoms get  worse. Summary  Atrophic vaginitis is a condition in which the tissues that line the vagina become dry and thin. It is most common in women who have stopped having regular menstrual periods (are in menopause).  Treatment options include using vaginal lubricants and low-dose vaginal estrogen.  Contact a health care provider if your vagina has an unusual smell, or if your symptoms get worse or do not improve after treatment. This information is not intended to replace advice given to you by your health care provider. Make sure you discuss any questions you have with your health care provider. Document Revised: 10/08/2017 Document Reviewed: 07/22/2017 Elsevier Patient Education  2020 [employer] About Cystocele  Overview  The pelvic organs, including the bladder, are normally supported by pelvic floor muscles and ligaments.  When these muscles and ligaments are stretched, weakened or torn, the wall between the bladder and the vagina sags or herniates causing a prolapse, sometimes called a cystocele.  This condition may cause discomfort and problems with emptying the bladder.  It can be present in various stages.  Some people are not aware of the changes.  Others may notice changes at the vaginal opening or a feeling of the bladder dropping outside the body.  Causes of a Cystocele  A cystocele is usually caused by muscle straining or stretching during childbirth.  In addition, cystocele is more common after menopause, because the hormone estrogen helps keep the elastic tissues around the pelvic organs strong.  A cystocele is more likely to occur when levels of estrogen decrease.  Other causes include: heavy lifting, chronic coughing, previous pelvic surgery and obesity.  Symptoms  A bladder that has dropped from its normal position may cause: unwanted urine leakage (stress incontinence), frequent urination or urge to urinate, incomplete emptying of the bladder (not feeling bladder relief  after emptying), pain or discomfort in the vagina, pelvis, groin, lower back or lower abdomen and frequent urinary tract infections.  Mild cases may not cause any symptoms.  Treatment Options  Pelvic floor (Kegel) exercises:  Strength training the muscles in your genital area  Behavioral changes: Treating and preventing constipation, taking time to empty your bladder properly, learning to lift properly and/or avoid heavy lifting when possible, stopping smoking, avoiding weight gain and treating a chronic cough or bronchitis.  A pessary: A vaginal support device is sometimes used to help pelvic support caused by muscle and ligament changes.  Surgery: Surgical repair may be necessary if symptoms cannot be managed with exercise, behavioral changes and a pessary.  Surgery is usually considered for severe cases.   2007, [employer]

## 2020-11-04 NOTE — Progress Notes (Signed)
85 y.o. [id number] Widowed Black or African American Not Hispanic or Latino female here for vaginal dryness. She thinks she may have some prolapse.   H/O hysterectomy.  She c/o a several month h/o noticing a bulge at the open of the vaginal, comes and goes. Not uncomfortable other than her vagina being dry. She only notices it when she is in the shower or wiping.   Voiding okay, voids frequently, small to normal amounts. May leak on the way to the bathroom if she waits to long. Hasn't changed, tolerable. Usually in the am when she is getting up. No nocturia. No GSI.  She has constipation, needs to take something every other day to have a BM. She takes a combination of senakot, MOM and linzess. She has a BM at least 3 x a week, sometimes strains.  H/O SBO, last hospitalized in July.   She has been on many antibiotics in the last few months and has been using   Her sister died in Sep 13, 2023 of cancer.      No LMP recorded. Patient has had a hysterectomy.          Sexually active: No.  The current method of family planning is post menopausal status.    Exercising: No.  The patient does not participate in regular exercise at present. Smoker:  no  Health Maintenance: Pap: 10- 11 years  History of abnormal Pap:  no MMG:  11/29/19 Normal   BMD:   10/16/20 Colonoscopy: 08/24/13  TDaP:  01/06/18 Gardasil: none    reports that she has never smoked. She has never used smokeless tobacco. She reports that she does not drink alcohol and does not use drugs.  Past Medical History:  Diagnosis Date  . Allergy   . Arthritis   . Asthma   . Cataract   . Chronic allergic rhinitis    with asthmatic component  . Diverticulosis of colon (without mention of hemorrhage)   . GERD (gastroesophageal reflux disease)   . Hearing impairment   . Hiatal hernia   . History of bursitis    right shoulder  . History of cochlear implant    Cant have an MRI  . History of colon polyps   . History of pneumonia   .  History of PSVT (paroxysmal supraventricular tachycardia)   . Hormone replacement therapy (postmenopausal)   . IBS (irritable bowel syndrome)   . Peptic stricture of esophagus   . Watermelon stomach     Past Surgical History:  Procedure Laterality Date  . ABDOMINAL HYSTERECTOMY    . APPENDECTOMY    . CHOLECYSTECTOMY    . COCHLEAR IMPLANT Left Oct '14   Done at [facility] - great results / Serial No: [id number] / Model: CI422  . ESOPHAGOGASTRODUODENOSCOPY    . EYE SURGERY    . POLYPECTOMY    . TUBAL LIGATION    . urethral dilatations    . VENTRICULAR ABLATION SURGERY      Current Outpatient Medications  Medication Sig Dispense Refill  . acetaminophen (TYLENOL) 650 MG CR tablet Take 650 mg by mouth every 8 (eight) hours as needed for pain.    Marland Kitchen ALPRAZolam (XANAX) 0.5 MG tablet Take 1 tablet (0.5 mg total) by mouth at bedtime as needed for anxiety. 30 tablet 0  . azelastine (ASTELIN) 0.1 % nasal spray Place 2 sprays into both nostrils 2 (two) times daily. 30 mL 0  . esomeprazole (NEXIUM) 20 MG capsule Take 20 mg by mouth daily at  12 noon.    . fluticasone (FLONASE) 50 MCG/ACT nasal spray Place 2 sprays into both nostrils daily. 1 g 0  . linaclotide (LINZESS) 145 MCG CAPS capsule Take 1 capsule (145 mcg total) by mouth daily as needed (cramping). 30 capsule 5  . magnesium hydroxide (MILK OF MAGNESIA) 400 MG/5ML suspension Take 30 mLs by mouth daily as needed for mild constipation.    . montelukast (SINGULAIR) 10 MG tablet Take 1 tablet (10 mg total) by mouth at bedtime. 90 tablet 3  . multivitamin-iron-minerals-folic acid (CENTRUM) chewable tablet Chew 1 tablet by mouth daily.    . Sennosides (SENOKOT EXTRA STRENGTH) 17.2 MG TABS Take 1 tablet (17.2 mg total) by mouth daily. 30 tablet 0  . traMADol (ULTRAM) 50 MG tablet TAKE 1/2 TO 1 TABLET BY MOUTH TWICE DAILY (Patient taking differently: Take 50 mg by mouth every 12 (twelve) hours as needed for moderate pain.) 30 tablet 4    No current facility-administered medications for this visit.    Family History  Problem Relation Age of Onset  . Diabetes Mother   . Uterine cancer Mother   . Coronary artery disease Father        CABG x 5 in 1985  . Hypertension Father   . Cancer Sister   . Cancer Brother   . Cancer Maternal Aunt   . Colon cancer Neg Hx     Review of Systems  Genitourinary: Positive for vaginal pain.  All other systems reviewed and are negative.   Exam:   BP 116/84   Pulse 79   Ht 5' 1.75" (1.568 m)   Wt 163 lb 6.4 oz (74.1 kg)   SpO2 99%   BMI 30.13 kg/m   Weight change: @WEIGHTCHANGE @ Height:   Height: 5' 1.75" (156.8 cm)  Ht Readings from Last 3 Encounters:  11/04/20 5' 1.75" (1.568 m)  10/16/20 5' 3.5" (1.613 m)  08/20/20 5' 3.5" (1.613 m)    General appearance: alert, cooperative and appears stated age    Pelvic: External genitalia:  no lesions              Urethra:  normal appearing urethra with no masses, tenderness or lesions              Bartholins and Skenes: normal                 Vagina: mildly atrophic appearing vagina. She has a small grade 2 cystocele, the prolapse comes slightly out of the introitus with standing and valsalva. No vault prolapse, no rectocele              Cervix: absent               Bimanual Exam:  Uterus:  uterus absent              Adnexa: no mass, fullness, tenderness                 Gae Dry chaperoned for the exam.  A:  Grade 2 cystocele, not bothersome  Mild vaginal atrophy  Vaginal dryness  Constipation, she is on multiple agents to help with this  Mild urge incontinence, not bothersome  P:   Discussed cystocele, information given and reviewed  Try to avoid straining and heavy lifting.   No treatment needed at this time for the cystocele  Discussed the option of using a pessary if she were to become symptomatic  Use coconut oil or Replens vaginal moisturizer as needed  F/U as needed

## 2020-11-18 ENCOUNTER — Telehealth: Payer: Self-pay | Admitting: Internal Medicine

## 2020-11-18 NOTE — Telephone Encounter (Signed)
1.  Order barium swallow with tablet "dysphagia" 2.  Keep already scheduled appointment with [facility] 3.  If I need to change anything in the interim based on the esophagram findings, I will.  Thanks

## 2020-11-18 NOTE — Telephone Encounter (Signed)
Inbound call from patient's granddaughter stating patient has been coughing and having issues with swallowing.  Informed next available appt is with an APP on 12/07/2020, but request to be seen sooner.  Please advise.

## 2020-11-18 NOTE — Telephone Encounter (Signed)
Please advise 

## 2020-11-20 ENCOUNTER — Other Ambulatory Visit: Payer: Self-pay | Admitting: Internal Medicine

## 2020-11-20 NOTE — Telephone Encounter (Signed)
[facility] Controlled Database Checked Last filled:   08/20/20  # 30 LOV w/you:  10/16/20  Next appt w/you:  none

## 2020-11-22 MED ORDER — ALPRAZOLAM 0.5 MG PO TABS: 0.5000 mg | ORAL_TABLET | Freq: Every evening | ORAL | 2 refills | Status: DC | PRN

## 2020-11-22 NOTE — Telephone Encounter (Signed)
Lm on granddaughter's voicemail.

## 2020-12-03 DIAGNOSIS — H903 Sensorineural hearing loss, bilateral: Secondary | ICD-10-CM | POA: Diagnosis not present

## 2020-12-11 ENCOUNTER — Ambulatory Visit: Payer: Medicare Other | Admitting: Physician Assistant

## 2020-12-12 ENCOUNTER — Other Ambulatory Visit: Payer: Self-pay

## 2020-12-12 ENCOUNTER — Ambulatory Visit (INDEPENDENT_AMBULATORY_CARE_PROVIDER_SITE_OTHER): Payer: Medicare Other | Admitting: Internal Medicine

## 2020-12-12 ENCOUNTER — Encounter: Payer: Self-pay | Admitting: Internal Medicine

## 2020-12-12 VITALS — BP 128/74 | HR 66 | Temp 98.6°F | Resp 18 | Ht 62.0 in | Wt 161.8 lb

## 2020-12-12 DIAGNOSIS — F419 Anxiety disorder, unspecified: Secondary | ICD-10-CM | POA: Diagnosis not present

## 2020-12-12 DIAGNOSIS — J3089 Other allergic rhinitis: Secondary | ICD-10-CM | POA: Diagnosis not present

## 2020-12-12 DIAGNOSIS — K5900 Constipation, unspecified: Secondary | ICD-10-CM

## 2020-12-12 MED ORDER — LINACLOTIDE 145 MCG PO CAPS: 145.0000 ug | ORAL_CAPSULE | Freq: Every day | ORAL | 3 refills | Status: DC | PRN

## 2020-12-12 NOTE — Patient Instructions (Addendum)
We will get you into the allergy specialist to get you checked out.  It is okay to use the 1/2 pill of xanax from time to time.   I would try some different activities like coloring or drawing or essential oils like lavender.    Textbook of family medicine (9th ed., pp. 2426-8341). [place], [place]: [employer].">  Stress, Adult Stress is a normal reaction to life events. Stress is what you feel when life demands more than you are used to, or more than you think you can handle. Some stress can be useful, such as studying for a test or meeting a deadline at work. Stress that occurs too often or for too long can cause problems. It can affect your emotional health and interfere with relationships and normal daily activities. Too much stress can weaken your body's defense system (immune system) and increase your risk for physical illness. If you already have a medical problem, stress can make it worse. What are the causes? All sorts of life events can cause stress. An event that causes stress for one person may not be stressful for another person. Major life events, whether positive or negative, commonly cause stress. Examples include:  Losing a job or starting a new job.  Losing a loved one.  Moving to a new town or home.  Getting married or divorced.  Having a baby.  Getting injured or sick. Less obvious life events can also cause stress, especially if they occur day after day or in combination with each other. Examples include:  Working long hours.  Driving in traffic.  Caring for children.  Being in debt.  Being in a difficult relationship. What are the signs or symptoms? Stress can cause emotional symptoms, including:  Anxiety. This is feeling worried, afraid, on edge, overwhelmed, or out of control.  Anger, including irritation or impatience.  Depression. This is feeling sad, down, helpless, or guilty.  Trouble focusing, remembering, or making decisions. Stress can cause  physical symptoms, including:  Aches and pains. These may affect your head, neck, back, stomach, or other areas of your body.  Tight muscles or a clenched jaw.  Low energy.  Trouble sleeping. Stress can cause unhealthy behaviors, including:  Eating to feel better (overeating) or skipping meals.  Working too much or putting off tasks.  Smoking, drinking alcohol, or using drugs to feel better. How is this diagnosed? Stress is diagnosed through an assessment by your health care provider. He or she may diagnose this condition based on:  Your symptoms and any stressful life events.  Your medical history.  Tests to rule out other causes of your symptoms. Depending on your condition, your health care provider may refer you to a specialist for further evaluation. How is this treated? Stress management techniques are the recommended treatment for stress. Medicine is not typically recommended for the treatment of stress. Techniques to reduce your reaction to stressful life events include:  Stress identification. Monitor yourself for symptoms of stress and identify what causes stress for you. These skills may help you to avoid or prepare for stressful events.  Time management. Set your priorities, keep a calendar of events, and learn to say no. Taking these actions can help you avoid making too many commitments. Techniques for coping with stress include:  Rethinking the problem. Try to think realistically about stressful events rather than ignoring them or overreacting. Try to find the positives in a stressful situation rather than focusing on the negatives.  Exercise. Physical exercise can release  both physical and emotional tension. The key is to find a form of exercise that you enjoy and do it regularly.  Relaxation techniques. These relax the body and mind. The key is to find one or more that you enjoy and use the techniques regularly. Examples include: ? Meditation, deep breathing,  or progressive relaxation techniques. ? Yoga or tai chi. ? Biofeedback, mindfulness techniques, or journaling. ? Listening to music, being out in nature, or participating in other hobbies.  Practicing a healthy lifestyle. Eat a balanced diet, drink plenty of water, limit or avoid caffeine, and get plenty of sleep.  Having a strong support network. Spend time with family, friends, or other people you enjoy being around. Express your feelings and talk things over with someone you trust. Counseling or talk therapy with a mental health professional may be helpful if you are having trouble managing stress on your own.   Follow these instructions at home: Lifestyle  Avoid drugs.  Do not use any products that contain nicotine or tobacco, such as cigarettes, e-cigarettes, and chewing tobacco. If you need help quitting, ask your health care provider.  Limit alcohol intake to no more than 1 drink a day for nonpregnant women and 2 drinks a day for men. One drink equals 12 oz of beer, 5 oz of wine, or 1 oz of hard liquor  Do not use alcohol or drugs to relax.  Eat a balanced diet that includes fresh fruits and vegetables, whole grains, lean meats, fish, eggs, and beans, and low-fat dairy. Avoid processed foods and foods high in added fat, sugar, and salt.  Exercise at least 30 minutes on 5 or more days each week.  Get 7-8 hours of sleep each night.   General instructions  Practice stress management techniques as discussed with your health care provider.  Drink enough fluid to keep your urine clear or pale yellow.  Take over-the-counter and prescription medicines only as told by your health care provider.  Keep all follow-up visits as told by your health care provider. This is important.   Contact a health care provider if:  Your symptoms get worse.  You have new symptoms.  You feel overwhelmed by your problems and can no longer manage them on your own. Get help right away if:  You  have thoughts of hurting yourself or others. If you ever feel like you may hurt yourself or others, or have thoughts about taking your own life, get help right away. You can go to your nearest emergency department or call:  Your local emergency services (911 in the [place]  A suicide crisis helpline, such as the [facility] at 662-825-6410. This is open 24 hours a day. Summary  Stress is a normal reaction to life events. It can cause problems if it happens too often or for too long.  Practicing stress management techniques is the best way to treat stress.  Counseling or talk therapy with a mental health professional may be helpful if you are having trouble managing stress on your own. This information is not intended to replace advice given to you by your health care provider. Make sure you discuss any questions you have with your health care provider. Document Revised: 07/12/2020 Document Reviewed: 07/12/2020 Elsevier Patient Education  2021 [employer].

## 2020-12-12 NOTE — Progress Notes (Signed)
   Subjective:   Patient ID: Emily Bentley, female    DOB: December 10, 1934, 86 y.o.   MRN: [id number]  HPI The patient is an 86 YO female coming in for concerns about anxiety (is getting anxious being at home so much, denies SI/HI, is a little down about things, no problems with sleeping or motivation, she does try to use deep breathing, wants to try more different things for anxiety but not sure what to try, uses rare alprazolam 1/2 tablet not daily but a couple times a week maybe) and sinuses (did try the prednisone in the past and this causes more anxiety, she is taking her singulair and this helps some, denies worsening symptoms, has a lot of clear drainage at times, uses azelastine nose spray also at times, wants to see an allergy specialist to see what she is allergic against) and constipation (using linzess 145 mcg daily, this seems to help her with more consistent BM, maybe several a week, not daily, denies abdominal cramping or blood in BM).   Review of Systems  Constitutional: Negative.   HENT: Positive for congestion, postnasal drip and rhinorrhea.   Eyes: Negative.   Respiratory: Negative for cough, chest tightness and shortness of breath.   Cardiovascular: Negative for chest pain, palpitations and leg swelling.  Gastrointestinal: Positive for constipation. Negative for abdominal distention, abdominal pain, diarrhea, nausea and vomiting.  Musculoskeletal: Negative.   Skin: Negative.   Neurological: Negative.   Psychiatric/Behavioral: Positive for decreased concentration and dysphoric mood. The patient is nervous/anxious.     Objective:  Physical Exam Constitutional:      Appearance: She is well-developed and well-nourished.  HENT:     Head: Normocephalic and atraumatic.     Right Ear: Tympanic membrane normal.     Left Ear: Tympanic membrane normal.     Nose: Rhinorrhea present.     Mouth/Throat:     Pharynx: No oropharyngeal exudate or posterior oropharyngeal erythema.  Eyes:      Extraocular Movements: EOM normal.  Cardiovascular:     Rate and Rhythm: Normal rate and regular rhythm.  Pulmonary:     Effort: Pulmonary effort is normal. No respiratory distress.     Breath sounds: Normal breath sounds. No wheezing or rales.  Abdominal:     General: Bowel sounds are normal. There is no distension.     Palpations: Abdomen is soft.     Tenderness: There is no abdominal tenderness. There is no rebound.  Musculoskeletal:        General: No edema.     Cervical back: Normal range of motion.  Skin:    General: Skin is warm and dry.  Neurological:     Mental Status: She is alert and oriented to person, place, and time.     Coordination: Coordination normal.  Psychiatric:        Mood and Affect: Mood and affect normal.     Vitals:   12/12/20 0935  BP: 128/74  Pulse: 66  Resp: 18  Temp: 98.6 F (37 C)  TempSrc: Oral  SpO2: 98%  Weight: 161 lb 12.8 oz (73.4 kg)  Height: 5\' 2"  (1.575 m)    This visit occurred during the SARS-CoV-2 public health emergency.  Safety protocols were in place, including screening questions prior to the visit, additional usage of staff PPE, and extensive cleaning of exam room while observing appropriate contact time as indicated for disinfecting solutions.   Assessment & Plan:

## 2020-12-13 ENCOUNTER — Telehealth: Payer: Self-pay | Admitting: Internal Medicine

## 2020-12-13 NOTE — Assessment & Plan Note (Signed)
Referral to allergy done as she wishes to find out what she is allergic to currently. Taking singulair and azelastine nose spray and flonase.

## 2020-12-13 NOTE — Telephone Encounter (Signed)
Referral sent to [facility] through [employer]

## 2020-12-13 NOTE — Assessment & Plan Note (Signed)
Given samples of linzess today and refilled linzess 145 mcg daily to help with BM as this is effective.

## 2020-12-13 NOTE — Telephone Encounter (Signed)
[facility] called  Patient would prefer to go to [place] instead due to it being more convenient for her and quicker to be seen.   Can you put an referral for [place] please?

## 2020-12-13 NOTE — Assessment & Plan Note (Signed)
We talked about various coping skills including coloring, drawing, reading, journaling, counseling, deep breathing, meditating. Can use xanax prn if needed.

## 2020-12-23 ENCOUNTER — Ambulatory Visit (INDEPENDENT_AMBULATORY_CARE_PROVIDER_SITE_OTHER): Payer: Medicare Other | Admitting: Internal Medicine

## 2020-12-23 ENCOUNTER — Encounter: Payer: Self-pay | Admitting: Internal Medicine

## 2020-12-23 ENCOUNTER — Other Ambulatory Visit: Payer: Self-pay

## 2020-12-23 VITALS — BP 130/74 | HR 73 | Temp 99.0°F | Resp 18 | Ht 62.0 in | Wt 160.6 lb

## 2020-12-23 DIAGNOSIS — R221 Localized swelling, mass and lump, neck: Secondary | ICD-10-CM | POA: Diagnosis not present

## 2020-12-23 DIAGNOSIS — E010 Iodine-deficiency related diffuse (endemic) goiter: Secondary | ICD-10-CM | POA: Diagnosis not present

## 2020-12-23 LAB — CBC
HCT: 41 % (ref 36.0–46.0)
Hemoglobin: 13.5 g/dL (ref 12.0–15.0)
MCHC: 32.9 g/dL (ref 30.0–36.0)
MCV: 81.5 fl (ref 78.0–100.0)
Platelets: 224 10*3/uL (ref 150.0–400.0)
RBC: 5.02 Mil/uL (ref 3.87–5.11)
RDW: 14.2 % (ref 11.5–15.5)
WBC: 5.8 10*3/uL (ref 4.0–10.5)

## 2020-12-23 LAB — TSH: TSH: 2.73 u[IU]/mL (ref 0.35–4.50)

## 2020-12-23 MED ORDER — NYSTATIN 100000 UNIT/ML MT SUSP
5.0000 mL | Freq: Two times a day (BID) | OROMUCOSAL | 0 refills | Status: DC
Start: 2020-12-23 — End: 2021-01-17

## 2020-12-23 NOTE — Patient Instructions (Addendum)
We will check the labs today to make sure you do not have a new allergy to foods.   You can use vaseline on the lips to help and you can use biotene mouth wash to help the dry mouth.

## 2020-12-23 NOTE — Progress Notes (Signed)
   Subjective:   Patient ID: Emily Bentley, female    DOB: Mar 13, 1935, 86 y.o.   MRN: [id number]  HPI The patient is an 86 YO female coming in for neck swelling. Has photo on her phone. She went to see brother and was driving home and noticed it in the mirror at home. She did have a hamburger for lunch that day. She denies pain in the swelling. She denies problems with breathing or swallowing. She did drink water fine afterwards. She did go to urgent care and they advised her to go to ER but she did not. Went down mostly by the next day. No itching or hives elsewhere.   Review of Systems  Constitutional: Negative.   HENT: Negative.        Neck swelling  Eyes: Negative.   Respiratory: Negative for cough, chest tightness and shortness of breath.   Cardiovascular: Negative for chest pain, palpitations and leg swelling.  Gastrointestinal: Negative for abdominal distention, abdominal pain, constipation, diarrhea, nausea and vomiting.  Musculoskeletal: Negative.   Skin: Negative.   Neurological: Negative.   Psychiatric/Behavioral: Negative.     Objective:  Physical Exam Constitutional:      Appearance: She is well-developed and well-nourished.  HENT:     Head: Normocephalic and atraumatic.  Eyes:     Extraocular Movements: EOM normal.  Neck:     Comments: Some fullness of the neck tissue in the thyroid region Cardiovascular:     Rate and Rhythm: Normal rate and regular rhythm.  Pulmonary:     Effort: Pulmonary effort is normal. No respiratory distress.     Breath sounds: Normal breath sounds. No wheezing or rales.  Abdominal:     General: Bowel sounds are normal. There is no distension.     Palpations: Abdomen is soft.     Tenderness: There is no abdominal tenderness. There is no rebound.  Musculoskeletal:        General: No edema.     Cervical back: Normal range of motion.  Skin:    General: Skin is warm and dry.  Neurological:     Mental Status: She is alert and oriented  to person, place, and time.     Coordination: Coordination normal.  Psychiatric:        Mood and Affect: Mood and affect normal.     Vitals:   12/23/20 0914  BP: 130/74  Pulse: 73  Resp: 18  Temp: 99 F (37.2 C)  TempSrc: Oral  SpO2: 97%  Weight: 160 lb 9.6 oz (72.8 kg)  Height: 5\' 2"  (1.575 m)    This visit occurred during the SARS-CoV-2 public health emergency.  Safety protocols were in place, including screening questions prior to the visit, additional usage of staff PPE, and extensive cleaning of exam room while observing appropriate contact time as indicated for disinfecting solutions.   Assessment & Plan:

## 2020-12-23 NOTE — Assessment & Plan Note (Signed)
Unclear etiology although could be allergic type reaction. It would be unusual to have thyroiditis without pain and lasting less than 24 hours. Checking food allergy as well as alpha gal. Checking TSH and CBC. She does not have notable swelling currently and true goiter would not resolve spontaneously. Infection would also not likely resolve spontaneously.

## 2020-12-26 ENCOUNTER — Telehealth: Payer: Self-pay

## 2020-12-26 NOTE — Telephone Encounter (Signed)
PA has been initiated on covermymeds for Linzess 145 mcg capsules. [employer]  Rx#: [id number] PA Case ID: [id number]  I will update once a decision has been made.

## 2020-12-30 LAB — FOOD ALLERGY PROFILE
Allergen, Salmon, f41: <0.1 kU/L
Almonds: <0.1 kU/L
CLASS: 0
CLASS: 0
CLASS: 0
CLASS: 0
CLASS: 0
CLASS: 0
CLASS: 0
CLASS: 0
CLASS: 0
CLASS: 0
CLASS: 0
Cashew IgE: <0.1 kU/L
Class: 0
Class: 0
Class: 0
Egg White IgE: <0.1 kU/L
Fish Cod: <0.1 kU/L
Hazelnut: <0.1 kU/L
Milk IgE: <0.1 kU/L
Peanut IgE: <0.1 kU/L
Scallop IgE: <0.1 kU/L
Sesame Seed f10: <0.1 kU/L
Shrimp IgE: 0.12 kU/L — ABNORMAL HIGH
Soybean IgE: <0.1 kU/L
Tuna IgE: <0.1 kU/L
Walnut: <0.1 kU/L
Wheat IgE: <0.1 kU/L

## 2020-12-30 LAB — ALPHA-GAL PANEL
Beef IgE: <0.1 kU/L (ref <0.35)
Class: 0
Class: 0
Class: 0
Galactose-alpha-1,3-galactose IgE: <0.1 kU/L (ref <0.10)
LAMB/MUTTON IGE: <0.1 kU/L (ref <0.35)
Pork IgE: <0.1 kU/L (ref <0.35)

## 2020-12-30 LAB — INTERPRETATION:

## 2021-01-08 NOTE — Telephone Encounter (Addendum)
PA was denied because Linzess is contraindicated in patients with a history of bowel obstruction.

## 2021-01-10 DIAGNOSIS — J301 Allergic rhinitis due to pollen: Secondary | ICD-10-CM | POA: Diagnosis not present

## 2021-01-10 DIAGNOSIS — J309 Allergic rhinitis, unspecified: Secondary | ICD-10-CM | POA: Diagnosis not present

## 2021-01-10 DIAGNOSIS — J3089 Other allergic rhinitis: Secondary | ICD-10-CM | POA: Diagnosis not present

## 2021-01-13 ENCOUNTER — Encounter: Payer: Self-pay | Admitting: Internal Medicine

## 2021-01-17 ENCOUNTER — Ambulatory Visit (INDEPENDENT_AMBULATORY_CARE_PROVIDER_SITE_OTHER): Payer: Medicare Other | Admitting: Internal Medicine

## 2021-01-17 ENCOUNTER — Other Ambulatory Visit: Payer: Self-pay

## 2021-01-17 ENCOUNTER — Encounter: Payer: Self-pay | Admitting: Internal Medicine

## 2021-01-17 DIAGNOSIS — M546 Pain in thoracic spine: Secondary | ICD-10-CM | POA: Diagnosis not present

## 2021-01-17 DIAGNOSIS — F419 Anxiety disorder, unspecified: Secondary | ICD-10-CM | POA: Diagnosis not present

## 2021-01-17 DIAGNOSIS — G8929 Other chronic pain: Secondary | ICD-10-CM | POA: Diagnosis not present

## 2021-01-17 DIAGNOSIS — R1310 Dysphagia, unspecified: Secondary | ICD-10-CM | POA: Diagnosis not present

## 2021-01-17 MED ORDER — NYSTATIN 100000 UNIT/ML MT SUSP: 5.0000 mL | Freq: Two times a day (BID) | OROMUCOSAL | 3 refills | Status: DC

## 2021-01-17 MED ORDER — ALPRAZOLAM 0.5 MG PO TABS: 0.5000 mg | ORAL_TABLET | Freq: Three times a day (TID) | ORAL | 2 refills | Status: DC | PRN

## 2021-01-17 MED ORDER — AZELASTINE HCL 0.1 % NA SOLN: 2.0000 | Freq: Two times a day (BID) | NASAL | 0 refills | Status: DC

## 2021-01-17 MED ORDER — FLUTICASONE PROPIONATE 50 MCG/ACT NA SUSP: 2.0000 | Freq: Every day | NASAL | 0 refills | Status: DC

## 2021-01-17 MED ORDER — TRAMADOL HCL 50 MG PO TABS: ORAL_TABLET | ORAL | 4 refills | Status: DC

## 2021-01-17 NOTE — Progress Notes (Signed)
   Subjective:   Patient ID: Emily Bentley, female    DOB: 1935-06-23, 86 y.o.   MRN: [id number]  HPI The patient is an 86 YO female coming in for anxiety (worse with recent loss of last remaining sister, still 4 brothers, flying out to [place] for funeral this weekend, is having 2-3 panic attacks per day, uses 1/2 of her xanax 0.5 mg and trying to walk to help also, denies SI/HI), and arthritis (takes rare tramadol, refills expired and she would like refilled today, denies taking often, denies side effects, mostly takes tylenol for pain except when bad), and swallowing (felt much improvement when using nystatin swish and swallow, almost gone, still some spitting and reflux with certain foods).   Review of Systems  Constitutional: Negative.   HENT: Negative.   Eyes: Negative.   Respiratory: Negative for cough, chest tightness and shortness of breath.   Cardiovascular: Negative for chest pain, palpitations and leg swelling.  Gastrointestinal: Negative for abdominal distention, abdominal pain, constipation, diarrhea, nausea and vomiting.       Reflux  Musculoskeletal: Positive for arthralgias and back pain.  Skin: Negative.   Neurological: Negative.   Psychiatric/Behavioral: Positive for sleep disturbance. Negative for behavioral problems, confusion, decreased concentration, dysphoric mood, hallucinations, self-injury and suicidal ideas. The patient is nervous/anxious.     Objective:  Physical Exam Constitutional:      Appearance: She is well-developed.  HENT:     Head: Normocephalic and atraumatic.  Cardiovascular:     Rate and Rhythm: Normal rate and regular rhythm.  Pulmonary:     Effort: Pulmonary effort is normal. No respiratory distress.     Breath sounds: Normal breath sounds. No wheezing or rales.  Abdominal:     General: Bowel sounds are normal. There is no distension.     Palpations: Abdomen is soft.     Tenderness: There is no abdominal tenderness. There is no rebound.   Musculoskeletal:     Cervical back: Normal range of motion.  Skin:    General: Skin is warm and dry.  Neurological:     Mental Status: She is alert and oriented to person, place, and time.     Coordination: Coordination normal.     Vitals:   01/17/21 1334  BP: 124/70  Pulse: 76  Resp: 18  Temp: 98.3 F (36.8 C)  TempSrc: Oral  SpO2: 97%  Weight: 157 lb 12.8 oz (71.6 kg)  Height: 5\' 2"  (1.575 m)    This visit occurred during the SARS-CoV-2 public health emergency.  Safety protocols were in place, including screening questions prior to the visit, additional usage of staff PPE, and extensive cleaning of exam room while observing appropriate contact time as indicated for disinfecting solutions.   Assessment & Plan:

## 2021-01-17 NOTE — Assessment & Plan Note (Signed)
Increase xanax to TID prn temporarily. We talked about how this has a chance to be habit forming and she will still try walks etc first.

## 2021-01-17 NOTE — Assessment & Plan Note (Signed)
Uses tramadol rarely, refills have expired. Refilled today. Reminded about small risk for dependence.

## 2021-01-17 NOTE — Patient Instructions (Addendum)
We have sent in the refill of the alprazolam that is the same strength but 2-3 times a day you could take it if needed.   We have refilled tramadol and the other medicines.

## 2021-01-17 NOTE — Assessment & Plan Note (Signed)
Improved with nystatin and refilled today.

## 2021-01-22 ENCOUNTER — Telehealth: Payer: Self-pay | Admitting: Internal Medicine

## 2021-01-22 NOTE — Telephone Encounter (Signed)
LVM for pt to rtn my call to schedule AWV with [facility]. Please schedule if pt calls the office.  ?

## 2021-01-24 DIAGNOSIS — Z20822 Contact with and (suspected) exposure to covid-19: Secondary | ICD-10-CM | POA: Diagnosis not present

## 2021-04-21 DIAGNOSIS — H26491 Other secondary cataract, right eye: Secondary | ICD-10-CM | POA: Diagnosis not present

## 2021-04-21 DIAGNOSIS — H353131 Nonexudative age-related macular degeneration, bilateral, early dry stage: Secondary | ICD-10-CM | POA: Diagnosis not present

## 2021-04-21 DIAGNOSIS — H43813 Vitreous degeneration, bilateral: Secondary | ICD-10-CM | POA: Diagnosis not present

## 2021-05-23 ENCOUNTER — Other Ambulatory Visit: Payer: Self-pay

## 2021-05-23 ENCOUNTER — Encounter: Payer: Self-pay | Admitting: Internal Medicine

## 2021-05-23 ENCOUNTER — Ambulatory Visit (INDEPENDENT_AMBULATORY_CARE_PROVIDER_SITE_OTHER): Payer: Medicare Other | Admitting: Internal Medicine

## 2021-05-23 ENCOUNTER — Ambulatory Visit: Payer: Medicare Other | Admitting: Internal Medicine

## 2021-05-23 DIAGNOSIS — F419 Anxiety disorder, unspecified: Secondary | ICD-10-CM | POA: Diagnosis not present

## 2021-05-23 MED ORDER — SERTRALINE HCL 50 MG PO TABS: 50.0000 mg | ORAL_TABLET | Freq: Every day | ORAL | 3 refills | Status: DC

## 2021-05-23 NOTE — Patient Instructions (Addendum)
We have sent in sertraline to help with the anxiety to take 1 pill in the morning.   After about 2-3 weeks this should be in the system so call or come back so we can figure out if the dose is right. Then you should try to reduce the xanax and try to stop taking this if possible. When you try to reduce I would recommend to reduce by a pill a week. So going from 3 times a day to 2 times a day and then the next week try to go down to 1 time a day.   It is okay to take the nystatin liquid until you see Dr. Henrene Pastor.

## 2021-05-23 NOTE — Assessment & Plan Note (Signed)
Rx sertraline 50 mg daily. Xanax prn and once this is in her system will try to wean off. She is having some mental slowing which is likely related. Follow up 3-4 weeks and adjust dose.

## 2021-05-23 NOTE — Progress Notes (Signed)
   Subjective:   Patient ID: Emily Bentley, female    DOB: [birth year], 86 y.o.   MRN: [id number]  HPI The patient is an 86 YO female coming in for anxiety. Seen back in March for same and we did temporarily increase frequency of her alprazolam due to several losses in the family. She has still been using this 2-3 times per day. She is having some more difficulty with thoughts and feels that it is impacting her mental sharpness.   Review of Systems  Constitutional: Negative.   HENT: Negative.    Eyes: Negative.   Respiratory:  Negative for cough, chest tightness and shortness of breath.   Cardiovascular:  Negative for chest pain, palpitations and leg swelling.  Gastrointestinal:  Negative for abdominal distention, abdominal pain, constipation, diarrhea, nausea and vomiting.  Musculoskeletal: Negative.   Skin: Negative.   Neurological: Negative.   Psychiatric/Behavioral:  The patient is nervous/anxious.    Objective:  Physical Exam Constitutional:      Appearance: She is well-developed.  HENT:     Head: Normocephalic and atraumatic.  Cardiovascular:     Rate and Rhythm: Normal rate and regular rhythm.  Pulmonary:     Effort: Pulmonary effort is normal. No respiratory distress.     Breath sounds: Normal breath sounds. No wheezing or rales.  Abdominal:     General: Bowel sounds are normal. There is no distension.     Palpations: Abdomen is soft.     Tenderness: There is no abdominal tenderness. There is no rebound.  Musculoskeletal:     Cervical back: Normal range of motion.  Skin:    General: Skin is warm and dry.  Neurological:     Mental Status: She is alert and oriented to person, place, and time.     Coordination: Coordination normal.    Vitals:   05/23/21 0804  BP: 122/78  Pulse: 67  Resp: 18  Temp: 98.4 F (36.9 C)  TempSrc: Oral  SpO2: 97%  Weight: 160 lb 3.2 oz (72.7 kg)  Height: 5\' 2"  (1.575 m)    This visit occurred during the SARS-CoV-2 public health  emergency.  Safety protocols were in place, including screening questions prior to the visit, additional usage of staff PPE, and extensive cleaning of exam room while observing appropriate contact time as indicated for disinfecting solutions.   Assessment & Plan:

## 2021-05-29 ENCOUNTER — Telehealth: Payer: Self-pay

## 2021-05-29 NOTE — Telephone Encounter (Signed)
Pts daughter has asked if the pt is still to take her Xanax at 2-3 times a day along taking Zoloft 1x a day? She also wants to know if its ok to still take the Nystatin while taking the Xanax and Zoloft?  *Sent to MA to advise.

## 2021-06-02 NOTE — Telephone Encounter (Signed)
See below

## 2021-06-02 NOTE — Telephone Encounter (Signed)
She should only be taking xanax as needed and as infrequently as possible. The Lorrin Mais is okay to take for sleep. The nystatin is not related so she can take this.

## 2021-06-03 NOTE — Telephone Encounter (Signed)
See my chart message

## 2021-06-09 ENCOUNTER — Ambulatory Visit: Payer: Medicare Other | Admitting: Internal Medicine

## 2021-06-16 ENCOUNTER — Ambulatory Visit: Payer: Medicare Other | Admitting: Internal Medicine

## 2021-06-20 ENCOUNTER — Encounter: Payer: Self-pay | Admitting: Internal Medicine

## 2021-06-20 ENCOUNTER — Other Ambulatory Visit: Payer: Self-pay

## 2021-06-20 ENCOUNTER — Ambulatory Visit (INDEPENDENT_AMBULATORY_CARE_PROVIDER_SITE_OTHER): Payer: Medicare Other | Admitting: Internal Medicine

## 2021-06-20 VITALS — BP 124/90 | HR 74 | Temp 98.3°F | Resp 18 | Ht 62.0 in | Wt 154.4 lb

## 2021-06-20 DIAGNOSIS — F419 Anxiety disorder, unspecified: Secondary | ICD-10-CM

## 2021-06-20 NOTE — Progress Notes (Signed)
   Subjective:   Patient ID: Emily Bentley, female    DOB: 1935-01-10, 86 y.o.   MRN: [id number]  HPI The patient is an 86 YO female coming in for follow up anxiety. She is taking zoloft 50 mg in the morning daily for almost 1 month. She has noticed some improvement. She is feeling like she may jump out of her skin and then takes 1/2 xanax and this helps and then another hour later she may take another 1/2. She is limiting her xanax to 1 pill maximum daily (down from 2-3 pills daily). She is feeling mentally clearer than before. Denies depression symptoms. Has had several major losses in the last year. She is feeling pressure from his children to move closer to them which she does not want to do. She does want to see a psychiatrist for her mental health.   Review of Systems  Constitutional: Negative.   HENT: Negative.    Eyes: Negative.   Respiratory:  Negative for cough, chest tightness and shortness of breath.   Cardiovascular:  Negative for chest pain, palpitations and leg swelling.  Gastrointestinal:  Negative for abdominal distention, abdominal pain, constipation, diarrhea, nausea and vomiting.  Musculoskeletal: Negative.   Skin: Negative.   Neurological: Negative.   Psychiatric/Behavioral:  The patient is nervous/anxious.    Objective:  Physical Exam Constitutional:      Appearance: She is well-developed.  HENT:     Head: Normocephalic and atraumatic.  Cardiovascular:     Rate and Rhythm: Normal rate and regular rhythm.  Pulmonary:     Effort: Pulmonary effort is normal. No respiratory distress.     Breath sounds: Normal breath sounds. No wheezing or rales.  Abdominal:     General: Bowel sounds are normal. There is no distension.     Palpations: Abdomen is soft.     Tenderness: There is no abdominal tenderness. There is no rebound.  Musculoskeletal:     Cervical back: Normal range of motion.  Skin:    General: Skin is warm and dry.  Neurological:     Mental Status: She  is alert and oriented to person, place, and time.     Coordination: Coordination normal.    Vitals:   06/20/21 0944  BP: 124/90  Pulse: 74  Resp: 18  Temp: 98.3 F (36.8 C)  TempSrc: Oral  SpO2: 98%  Weight: 154 lb 6.4 oz (70 kg)  Height: '5\' 2"'$  (1.575 m)    This visit occurred during the SARS-CoV-2 public health emergency.  Safety protocols were in place, including screening questions prior to the visit, additional usage of staff PPE, and extensive cleaning of exam room while observing appropriate contact time as indicated for disinfecting solutions.   Assessment & Plan:

## 2021-06-20 NOTE — Patient Instructions (Signed)
Keep taking the zoloft (the blue pill).   It is okay to push the visit back the visit with Dr. Henrene Pastor

## 2021-06-20 NOTE — Assessment & Plan Note (Signed)
She is doing better with zoloft 50 mg daily and we decided to leave that dosing the same. She has cut back to 1 xanax maximum daily and can be refilled if needed. She wishes referral to psychiatrist which is done today. Advised that the jitteriness could be withdrawal from xanax. Advised to keep taking zoloft 50 mg daily for at least 3 months.

## 2021-06-23 ENCOUNTER — Ambulatory Visit: Payer: Medicare Other | Admitting: Internal Medicine

## 2021-07-23 DIAGNOSIS — J301 Allergic rhinitis due to pollen: Secondary | ICD-10-CM | POA: Diagnosis not present

## 2021-07-23 DIAGNOSIS — J3089 Other allergic rhinitis: Secondary | ICD-10-CM | POA: Diagnosis not present

## 2021-07-25 NOTE — Telephone Encounter (Signed)
This encounter was created in error - please disregard.

## 2021-07-30 ENCOUNTER — Other Ambulatory Visit: Payer: Self-pay | Admitting: *Deleted

## 2021-07-30 DIAGNOSIS — J301 Allergic rhinitis due to pollen: Secondary | ICD-10-CM | POA: Insufficient documentation

## 2021-07-30 NOTE — Patient Outreach (Signed)
[facility]) Care Management  07/30/2021  Emily Bentley 1935-04-06 [id number]   [facility] unsuccessful outreach to self referred patient  Emily Bentley was referred to [facility] on 07/25/21 by family with an outreach to [facility] The referral indicated pt's grand daughter, [name], voiced concern for the patient's well being related to increased anxiety and depression. The referral indicated patient had been recently placed on medication daily by her primary care provider (PCP). The referral indicated the grand daughter concern the patient will get dependent and shares the patient's loss of both family and friends over the last year. The referral shares grand daughter's concern that the patient does not have the same interest as in the past, is very supportive but feels lost as to what to do as this is new to them all. The referral requests to engage patient for [facility] services as well as refer to Education officer, museum.  Gi Or Norman CMA verified the contact numbers and indicates to outreach to the home number versus the cell  Outreach attempt to the home number [phone] as requested per referral No answer. [facility] RN CM left HIPAA [facility] Portability and Accountability Act) compliant voicemail message along with CM's contact info.   Plan: [facility] RN CM scheduled this patient for another call attempt within 4-7 business days Unsuccessful outreach letter sent on 07/30/21 Unsuccessful outreach on 07/30/21   Joelene Millin L. Lavina Hamman, RN, BSN, [place] Coordinator Office number [phone] Mobile number [phone]  Main [facility] number [phone] Fax number [phone]

## 2021-08-04 ENCOUNTER — Other Ambulatory Visit: Payer: Self-pay | Admitting: *Deleted

## 2021-08-04 ENCOUNTER — Encounter: Payer: Self-pay | Admitting: *Deleted

## 2021-08-04 NOTE — Patient Outreach (Signed)
Emily Bentley) Care Management  08/04/2021  Emily Bentley [birth year] [id number]  [facility] outreach to self referred patient Emily Bentley was referred to [facility] on 07/25/21 by family with an outreach to [facility] ([place]). The referral indicated pt's grand daughter, [name], voiced concern for the patient's well being related to increased anxiety and depression. The referral indicated patient had been recently placed on medication daily by her primary care provider (PCP). The referral indicated the grand daughter concern the patient will get dependent and shares the patient's loss of both family and friends over the last year. The referral shares grand daughter's concern that the patient does not have the same interest as in the past, is very supportive but feels lost as to what to do as this is new to them all. The referral requests to engage patient for [facility] services as well as refer to Education officer, museum.  [facility] CMA verified the contact numbers and indicates to outreach to the home number versus the cell    Outreach attempt to the home number [phone] x 2 listed in [employer] as the primary number Emily Bentley call dropped x 1 but on the second call she was able to verbalized she has hearing issues and was not able to get the close caption machine to work. She requested call back at [phone]  RN CM also recognized this was listed in the referral also therefore changed the priority number to [phone]  Emily Bentley was reached at [phone] She continued to have difficulty with her close caption device operation but informed RN CM she would have [name] assisted her back to outreach to RN CM at [employer] CM office number   Plans Pending return call from patient or outreach attempt to her in 4-7 business days   [place] L. Lavina Hamman, RN, BSN, [place] Coordinator Office number [phone] Main [facility] number [phone] Fax number [phone]

## 2021-08-06 ENCOUNTER — Encounter: Payer: Self-pay | Admitting: Internal Medicine

## 2021-08-06 ENCOUNTER — Ambulatory Visit (INDEPENDENT_AMBULATORY_CARE_PROVIDER_SITE_OTHER): Payer: Medicare Other | Admitting: Internal Medicine

## 2021-08-06 VITALS — BP 120/76 | HR 76 | Ht 62.0 in | Wt 151.0 lb

## 2021-08-06 DIAGNOSIS — K222 Esophageal obstruction: Secondary | ICD-10-CM

## 2021-08-06 DIAGNOSIS — R1310 Dysphagia, unspecified: Secondary | ICD-10-CM | POA: Diagnosis not present

## 2021-08-06 DIAGNOSIS — K219 Gastro-esophageal reflux disease without esophagitis: Secondary | ICD-10-CM

## 2021-08-06 DIAGNOSIS — K5900 Constipation, unspecified: Secondary | ICD-10-CM

## 2021-08-06 NOTE — Progress Notes (Signed)
HISTORY OF PRESENT ILLNESS:  Emily Bentley is a 86 y.o. female with past medical history as listed below who has been followed in this office for GERD, peptic stricture requiring esophageal dilation, and functional constipation.  She also has a history of small bowel obstruction.  She was last evaluated Mar 19, 2020.  She presents today with a chief complaint of increased phlegm upon awakening in the morning.  It takes her a while to produce the phlegm.  She tells me that she had an ENT evaluation and was told that her problem was related to acid reflux.  Currently, she takes Nexium OTC 20 mg daily for her GERD.  She denies active classic reflux symptoms.  No recurrent dysphagia since her last upper endoscopy with esophageal dilation April 2021.  Review of outside blood work from February 2022 shows normal hemoglobin of 13.3.  The patient continues with chronic constipation.  She is unable to afford Linzess due to insurance denial.  She requests samples.  I was sorry to hear that 2 of her sisters recently passed.  She has been under a lot of stress with anxiety.  REVIEW OF SYSTEMS:  All non-GI ROS negative unless otherwise stated in the HPI except for anxiety  Past Medical History:  Diagnosis Date   Allergy    Arthritis    Asthma    Cataract    Chronic allergic rhinitis    with asthmatic component   Diverticulosis of colon (without mention of hemorrhage)    GERD (gastroesophageal reflux disease)    Hearing impairment    Hiatal hernia    History of bursitis    right shoulder   History of cochlear implant    Cant have an MRI   History of colon polyps    History of pneumonia    History of PSVT (paroxysmal supraventricular tachycardia)    Hormone replacement therapy (postmenopausal)    IBS (irritable bowel syndrome)    Peptic stricture of esophagus    Watermelon stomach     Past Surgical History:  Procedure Laterality Date   ABDOMINAL HYSTERECTOMY     APPENDECTOMY      CHOLECYSTECTOMY     COCHLEAR IMPLANT Left Oct '14   Done at [facility] - great results / Serial No: [id number] / Model: [id number]   ESOPHAGOGASTRODUODENOSCOPY     EYE SURGERY     POLYPECTOMY     TUBAL LIGATION     urethral dilatations     VENTRICULAR ABLATION SURGERY      Social History MERSADEZ LINDEN  reports that she has never smoked. She has never used smokeless tobacco. She reports that she does not drink alcohol and does not use drugs.  family history includes Cancer in her brother, maternal aunt, and sister; Coronary artery disease in her father; Diabetes in her mother; Hypertension in her father; Uterine cancer in her mother.  Allergies  Allergen Reactions   Sulfamethoxazole-Trimethoprim     Other reaction(s): Unknown       PHYSICAL EXAMINATION: Vital signs: BP 120/76   Pulse 76   Ht 5\' 2"  (1.575 m)   Wt 151 lb (68.5 kg)   BMI 27.62 kg/m   Constitutional: generally well-appearing, no acute distress Psychiatric: alert and oriented x3, cooperative Eyes: extraocular movements intact, anicteric, conjunctiva pink Mouth: oral pharynx moist, no lesions Ears: Hearing aids Neck: supple no lymphadenopathy Cardiovascular: heart regular rate and rhythm, no murmur Lungs: clear to auscultation bilaterally Abdomen: soft, nontender, nondistended, no obvious ascites, no  peritoneal signs, normal bowel sounds, no organomegaly Rectal: Omitted Extremities: no clubbing, cyanosis, or lower extremity edema bilaterally Skin: no lesions on visible extremities Neuro: No focal deficits.  Cranial nerves intact  ASSESSMENT:  1.  Increased phlegm production in the mornings.  Etiology not clear.  She has had ENT evaluation.  They point to GERD. 2.  GERD.  Classic symptoms controlled with Nexium 20 mg daily. 3.  History of peptic stricture.  Currently asymptomatic post dilation on PPI 4.  Chronic constipation.  Functional.  Has responded nicely to Singac. 5.  History of small bowel  obstruction due to adhesions 6.  History of adenomatous colon polyps.  Last colonoscopy 2014.  Diverticulosis only.  Has aged out of surveillance.   PLAN:  1.  Reflux precautions 2.  Increase Nexium to 20 mg twice daily, in case reflux might be participating in her pharyngeal complaints. 3.  Linzess samples provided.  Medication risks reviewed 4.  Routine GI follow-up 1 year A total time of 30 minutes was spent preparing to see the patient, obtaining history, performing physical exam, counseling the patient regarding the above listed issues, directing medical therapy, and documenting clinical information in her health record

## 2021-08-06 NOTE — Patient Instructions (Signed)
If you are age 84 or older, your body mass index should be between 23-30. Your Body mass index is 27.62 kg/m. If this is out of the aforementioned range listed, please consider follow up with your Primary Care Provider.  If you are age 15 or younger, your body mass index should be between 19-25. Your Body mass index is 27.62 kg/m. If this is out of the aformentioned range listed, please consider follow up with your Primary Care Provider.   __________________________________________________________  [facility] providers would like to encourage you to use [facility] to communicate with providers for non-urgent requests or questions.  Due to long hold times on the telephone, sending your provider a message by [facility] may be a faster and more efficient way to get a response.  Please allow 48 business hours for a response.  Please remember that this is for non-urgent requests.   Increase your Nexium to twice a day.  You have been given some samples of Linzess 126mcg

## 2021-08-07 ENCOUNTER — Ambulatory Visit: Payer: Self-pay | Admitting: *Deleted

## 2021-08-07 ENCOUNTER — Other Ambulatory Visit: Payer: Self-pay

## 2021-08-07 ENCOUNTER — Encounter: Payer: Self-pay | Admitting: *Deleted

## 2021-08-07 ENCOUNTER — Other Ambulatory Visit: Payer: Self-pay | Admitting: *Deleted

## 2021-08-07 NOTE — Patient Outreach (Addendum)
[facility] Emily Bentley) Care Management  08/07/2021  Emily Bentley 1935-06-07 [id number]  [facility] outreach to self referred patient Emily Bentley was referred to [facility] on 07/25/21 by family with an outreach to [facility] The referral indicated pt's grand daughter, Emily Bentley, voiced concern for the patient's well being related to increased anxiety and depression. The referral indicated patient had been recently placed on medication daily by her primary care provider (PCP). The referral indicated the grand daughter concern the patient will get dependent and shares the patient's loss of both family and friends over the last year. The referral shares grand daughter's concern that the patient does not have the same interest as in the past, is very supportive but feels lost as to what to do as this is new to them all. The referral requests to engage patient for [facility] services as well as refer to Education officer, museum.   Outreach to patient home number [phone] Emily Bentley answered with the assistance of her grand daughter, Emily Bentley and her new closed caption device   Emily Bentley is able to verify her HIPAA identifiers Emily Bentley states at any time she is not reachable at [phone] all [facility] staff are requested to call Emily Bentley at [phone]. Emily Bentley reports at times she does not answer her phone (frequent scam calls and hearing loss)   Assessment Emily Bentley reports she is independent with all her care needs except some assistance with speaking with others on her phone as she has hearing loss She does drive, feeds, bathes, dresses herself and completes  IADLs (instrumental Activities of Daily Living) needs   Anxiety/depression/-grief Emily Bentley confirms she has been placed on xanax in March 2022 and then on Zoloft in July 2022 by her medical provider for assistance with frequent loss of family (2 sisters) and friends in October 2021 and February 2022.  She and Emily Bentley confirm she is trying to  decrease the use of xanax as there are concern with her becoming dependent on it. Emily Bentley reports unmanageable anxiety and has more issues during the weekend when she is at home alone She confirms she has not received counseling. She was offered counseling but reports the provider was located in [place] and she did not prefer to drive the distance to see the provider. Her preference is a local in person provider as she does not prefer telephonic services for anxiety. RN CM reviewed [facility] SW and care guide services  She agrees to RN CM completing a referral to the appropriate services for her.   Emily Bentley states her goal is "want to get someone to help me with my anxiety" and "get my hearing better"  She confirms she is scheduled to see a provider in [place] Emily Bentley, for her hearing on 08/27/21    Depression screen PHQ 2/9 08/07/2021  Decreased Interest 1  Down, Depressed, Hopeless 1  PHQ - 2 Score 2  Altered sleeping 1  Tired, decreased energy 1  Change in appetite 1  Feeling bad or failure about yourself  1  Trouble concentrating 0  Moving slowly or fidgety/restless 0  Suicidal thoughts 0  PHQ-9 Score 6  Difficult doing work/chores Somewhat difficult    Gastrointestinal (GI)/diverticulosis/GERD peptic stricture Saw Dr Rikki Spearing on 08/06/21 for increased phlegm, runny nose and spitting" upon awakening in the morning.  Her Nexium for GERD (gastroesophageal reflux disease) was increased to 20 mg bid. Linzess samples provided. Pending follow  up of change in treatment   Nutrition/weight loss Mr Baine confirms a weight loss of 28 lbs over the last year. She states the weight loss was unintentional. Poor appetite and related to her anxiety for the loss of family  RN CM provided education on Body Mass Index (BMI) and discussed her ht 5'2"/wt 151 lbs with a BMI of 27.62 She reports did weighed 185 lbs Taking in Ensure and breakfast drink  Patient Active Problem List    Diagnosis Date Noted   Allergic rhinitis due to pollen 07/30/2021   Neck swelling 12/23/2020   Laryngopharyngeal reflux 10/28/2020   Vaginal prolapse 10/18/2020   Vitamin D deficiency 08/20/2020   Tingling 08/20/2020   Myalgia 08/20/2020   Bone pain 08/20/2020   Bilateral thoracic back pain 08/20/2020   SBO (small bowel obstruction) ([place]) 05/07/2020   Small bowel obstruction ([place]) 05/07/2020   Dysphagia 01/04/2020   Chronic bilateral thoracic back pain 09/28/2019   Scalp cyst 09/28/2019   Allergic rhinitis 09/28/2019   Constipation 08/25/2018   Elevated blood pressure reading in office without diagnosis of hypertension 08/25/2018   Anxiety 02/04/2018   Fatigue 12/15/2016   Degenerative disc disease, lumbar 09/16/2015   Routine general medical examination at a health care facility 07/02/2013   H/O supraventricular tachycardia 08/25/2012   Bilateral sensorineural hearing loss 07/28/2012   OAB (overactive bladder) 05/25/2012   Osteopenia 10/09/2009   Cochlear implant in place 12/30/2007   PSVT 12/30/2007   GERD (gastroesophageal reflux disease) 12/30/2007   Past Medical History:  Diagnosis Date   Allergy    Arthritis    Asthma    Cataract    Chronic allergic rhinitis    with asthmatic component   Diverticulosis of colon (without mention of hemorrhage)    GERD (gastroesophageal reflux disease)    Hearing impairment    Hiatal hernia    History of bursitis    right shoulder   History of cochlear implant    Cant have an MRI   History of colon polyps    History of pneumonia    History of PSVT (paroxysmal supraventricular tachycardia)    Hormone replacement therapy (postmenopausal)    IBS (irritable bowel syndrome)    Peptic stricture of esophagus    Watermelon stomach    Current Outpatient Medications on File Prior to Visit  Medication Sig Dispense Refill   ALPRAZolam (XANAX) 0.5 MG tablet Take 1 tablet (0.5 mg total) by mouth 3 (three) times daily as needed for  anxiety. 90 tablet 2   sertraline (ZOLOFT) 50 MG tablet Take 1 tablet (50 mg total) by mouth daily. 30 tablet 3   acetaminophen (TYLENOL) 650 MG CR tablet Take 650 mg by mouth every 8 (eight) hours as needed for pain.     azelastine (ASTELIN) 0.1 % nasal spray Place 2 sprays into both nostrils 2 (two) times daily. 30 mL 0   esomeprazole (NEXIUM) 20 MG capsule Take 20 mg by mouth daily at 12 noon.     fluticasone (FLONASE) 50 MCG/ACT nasal spray Place 2 sprays into both nostrils daily. 1 g 0   loratadine (CLARITIN) 10 MG tablet Take 10 mg by mouth daily.     magnesium hydroxide (MILK OF MAGNESIA) 400 MG/5ML suspension Take 30 mLs by mouth daily as needed for mild constipation.     montelukast (SINGULAIR) 10 MG tablet Take 1 tablet (10 mg total) by mouth at bedtime. 90 tablet 3   multivitamin-iron-minerals-folic acid (CENTRUM) chewable tablet Chew 1 tablet by  mouth daily.     nystatin (MYCOSTATIN) 100000 UNIT/ML suspension Take 5 mLs (500,000 Units total) by mouth in the morning and at bedtime. 473 mL 3   Sennosides (SENOKOT EXTRA STRENGTH) 17.2 MG TABS Take 1 tablet (17.2 mg total) by mouth daily. 30 tablet 0   traMADol (ULTRAM) 50 MG tablet TAKE 1/2 TO 1 TABLET BY MOUTH TWICE DAILY 30 tablet 4   No current facility-administered medications on file prior to visit.    Plans Patient agrees to care plan and follow up within the 7-14 next business days EMMIs education sent to confirmed e-mail on GERD, relieving stress, constipation in adults, generalized anxiety disorder, adult dealing with death, high calorie, high protein diet, minimize weight loss, cognitive behavioral therapy   Goals Addressed               This Visit's Progress     Patient Stated     Begin and Stick with Counseling-Depression([facility]) (pt-stated)   Not on track     Timeframe:  Long-Range Goal Priority:  High Start Date:                            08/07/21 Expected End Date:                     11/07/21  Follow Up Date  08/15/21- pending  Barriers: Hearing Knowledge   - check out counseling in person sessions to  manage anxiety   Notes:  08/07/21 agrees to [facility] referral to SW or care guide for resources to assist with anxiety      Find Help in My [facility]) (pt-stated)   Not on track     Timeframe:  Short-Term Goal Priority:  High Start Date:                            08/07/21 Expected End Date:              10/08/21         Follow Up Date 08/15/21   Barriers: Hearing Knowledge  - follow-up on any referrals for help I am given    Why is this important?   Knowing how and where to find help for yourself or family in your neighborhood and community is an important skill.  You will want to take some steps to learn how.    Notes:  08/07/21 agrees to [facility] referral to SW or care guide for resources to assist with anxiety      Manage My [facility]) (pt-stated)   Not on track     Timeframe:  Long-Range Goal Priority:  High Start Date:          08/07/21                   Expected End Date:        11/07/21               Follow Up Date 08/15/21  Barriers: Hearing Knowledge  -  begin personal counseling    Notes:  08/07/21 agrees to [facility] referral to SW or care guide for resources to assist with anxiety        Add [place] L. Lavina Hamman, RN, BSN, [place] Coordinator Office number [phone] Main [facility] number [phone] Fax number [phone]

## 2021-08-08 ENCOUNTER — Other Ambulatory Visit: Payer: Self-pay

## 2021-08-08 ENCOUNTER — Telehealth: Payer: Self-pay | Admitting: *Deleted

## 2021-08-08 DIAGNOSIS — F411 Generalized anxiety disorder: Secondary | ICD-10-CM

## 2021-08-08 NOTE — Chronic Care Management (AMB) (Signed)
  Chronic Care Management   Outreach Note  08/08/2021 Name: Emily Bentley MRN: [id number] DOB: 01-10-1935  Emily Bentley is a 86 y.o. year old female who is a primary care patient of [name], MD. I reached out to [name] by phone today in response to a referral sent by [name]'s primary care provider.  An unsuccessful telephone outreach was attempted today. The patient was referred to the case management team for assistance with care management and care coordination.   Follow Up Plan: A HIPAA compliant phone message was left for the patient providing contact information and requesting a return call. The care management team will reach out to the patient again over the next 7 days.  If patient returns call to provider office, please advise to call [place] at [phone].  [place] Management  Direct Dial: [phone]

## 2021-08-14 ENCOUNTER — Other Ambulatory Visit: Payer: Self-pay | Admitting: *Deleted

## 2021-08-14 NOTE — Patient Outreach (Signed)
[facility]) Care Management  08/14/2021  Emily Bentley 08-09-1935 [id number]   [place] coordination- collaboration with pcp office staff  RN CM outreached to [facility] embedded RN CM L Tousey Provided case information Reviewed 08/08/21 [facility] note  No answer at [phone], stella granddaughter. [facility] RN CM left HIPAA HIPAA [facility] Portability and Accountability Act) compliant voicemail message along with CM's contact info. Also updated her on pending SW name, Embedded RN CM name, encouraged an outreach to [place] at [phone] or this RN CM for further details  Plan RN CM will continue to collaborate with pcp office staff and close case when available for [facility] telephonic RN CM services    [place]. Lavina Hamman, RN, BSN, [place] Coordinator Office number [phone] Mobile number [phone]  Main [facility] number [phone] Fax number [phone]

## 2021-08-14 NOTE — Patient Outreach (Signed)
[facility]) Care Management  08/14/2021  Emily Bentley February 17, 1935 [id number]   [facility] Incoming call from patient family   [name] returned a call to RN CM  RN CM updated her on the process to get to pcp office [facility] staff. She voiced understanding. She states she will outreach to [place] at [phone] to connect with L Tousey Updated her on progress for [facility] SW -pending Questions answered   [name] reports Emily Bentley over the weekend has attempted to decrease the use of xanax by not taking it without success. [name] and RN CM discussed the process of weaning off xanax needs to be monitored and encouraged.   Emily Bentley was referred to [facility] on 07/25/21 by family with an outreach [facility] The referral indicated pt's grand daughter, [name], voiced concern for the patient's well being related to increased anxiety and depression. The referral indicated patient had been recently placed on medication daily by her primary care provider (PCP). The referral indicated the grand daughter concern the patient will get dependent and shares the patient's loss of both family and friends over the last year. The referral shares grand daughter's concern that the patient does not have the same interest as in the past, is very supportive but feels lost as to what to do as this is new to them all. The referral requests to engage patient for [facility] services as well as refer to Education officer, museum.    This RN CM completed the initial assessment for Emily Bentley on 08/07/21 with help with [name] in the home. Emily Bentley reports she is basically independent and medically stable except with anxiety management that originated from the loss of family and friends over the last year. She wants to decrease her use of  xanax as there are concern with her becoming dependent on it. Emily Bentley reports unmanageable anxiety and has more issues during the weekend when she is at home alone She confirms she has not  received counseling. She was offered counseling but reports the provider was located in [place] and she did not prefer to drive the distance to see the provider. Her preference is a local in person provider as she does not prefer telephonic services for anxiety. RN CM reviewed [facility] SW and care guide services  She agrees to RN CM completing a referral to the appropriate services for her.     Emily Bentley states her goal is "want to get someone to help me with my anxiety" and "get my hearing better"  Plan RN CM will continue to collaborate with pcp office staff and close case when available for [facility] telephonic RN CM services   [place]. Lavina Hamman, RN, BSN, [place] Coordinator Office number [phone] Main [facility] number [phone] Fax number [phone]

## 2021-08-19 NOTE — Chronic Care Management (AMB) (Signed)
  Chronic Care Management   Outreach Note  08/19/2021 Name: [name] MRN: [id number] DOB: August 29, 1935  [name] is a 86 y.o. year old female who is a primary care patient of [name], MD. I reached out to [name] by phone today in response to a referral sent by Ms. Darrol Poke Bann's primary care provider.  A second unsuccessful telephone outreach was attempted today. The patient was referred to the case management team for assistance with care management and care coordination.   Follow Up Plan: A HIPAA compliant phone message was left for the patient providing contact information and requesting a return call.  The care management team will reach out to the patient again over the next 7 days.  If patient returns call to provider office, please advise to call [place]* at [phone].*  [place] Management  Direct Dial: [phone]

## 2021-08-20 NOTE — Chronic Care Management (AMB) (Signed)
  Chronic Care Management   Note  08/20/2021 Name: Emily Bentley MRN: [id number] DOB: [birth year]  Emily Bentley is a 86 y.o. year old female who is a primary care patient of [name], MD. I reached out to [name] by phone today in response to a referral sent by Ms. Emily Bentley's PCP.  Emily Bentley was given information about Chronic Care Management services today including:  CCM service includes personalized support from designated clinical staff supervised by her physician, including individualized plan of care and coordination with other care providers 24/7 contact phone numbers for assistance for urgent and routine care needs. Service will only be billed when office clinical staff spend 20 minutes or more in a month to coordinate care. Only one practitioner may furnish and Bentley the service in a calendar month. The patient may stop CCM services at any time (effective at the end of the month) by phone call to the office staff. The patient is responsible for co-pay (up to 20% after annual deductible is met) if co-pay is required by the individual health plan.   Granddaughter Emily Bentley DPR on file   verbally agreed to assistance and services provided by embedded care coordination/care management team today.  Follow up plan: Telephone appointment with care management team member scheduled for:09/05/21  [place] Management  Direct Dial: [phone]

## 2021-08-28 ENCOUNTER — Other Ambulatory Visit: Payer: Self-pay

## 2021-08-28 ENCOUNTER — Encounter: Payer: Self-pay | Admitting: Internal Medicine

## 2021-08-28 ENCOUNTER — Ambulatory Visit (INDEPENDENT_AMBULATORY_CARE_PROVIDER_SITE_OTHER): Payer: Medicare Other | Admitting: Internal Medicine

## 2021-08-28 DIAGNOSIS — H9203 Otalgia, bilateral: Secondary | ICD-10-CM | POA: Diagnosis not present

## 2021-08-28 MED ORDER — AMOXICILLIN-POT CLAVULANATE 875-125 MG PO TABS: 1.0000 | ORAL_TABLET | Freq: Two times a day (BID) | ORAL | 0 refills | Status: DC

## 2021-08-28 NOTE — Patient Instructions (Addendum)
We have sent in augmentin to take 1 pill twice a day for 10 days.   I think it is okay to try the natural medicine for the stress and anxiety.

## 2021-08-28 NOTE — Progress Notes (Signed)
   Subjective:   Patient ID: Emily Bentley, female    DOB: 02-07-1935, 86 y.o.   MRN: [id number]  HPI The patient is an 86 YO female coming in for concerns about ear pain.   Review of Systems  Constitutional: Negative.   HENT:  Positive for ear pain.   Eyes: Negative.   Respiratory:  Negative for cough, chest tightness and shortness of breath.   Cardiovascular:  Negative for chest pain, palpitations and leg swelling.  Gastrointestinal:  Negative for abdominal distention, abdominal pain, constipation, diarrhea, nausea and vomiting.  Musculoskeletal: Negative.   Skin: Negative.   Neurological: Negative.   Psychiatric/Behavioral: Negative.     Objective:  Physical Exam Constitutional:      Appearance: She is well-developed.  HENT:     Head: Normocephalic and atraumatic.     Right Ear: Tympanic membrane normal.     Left Ear: Tympanic membrane normal.  Cardiovascular:     Rate and Rhythm: Normal rate and regular rhythm.  Pulmonary:     Effort: Pulmonary effort is normal. No respiratory distress.     Breath sounds: Normal breath sounds. No wheezing or rales.  Abdominal:     General: Bowel sounds are normal. There is no distension.     Palpations: Abdomen is soft.     Tenderness: There is no abdominal tenderness. There is no rebound.  Musculoskeletal:     Cervical back: Normal range of motion.  Skin:    General: Skin is warm and dry.  Neurological:     Mental Status: She is alert and oriented to person, place, and time.     Coordination: Coordination normal.    Vitals:   08/28/21 1117  BP: 128/70  Pulse: 77  Resp: 18  SpO2: 97%  Weight: 150 lb 3.2 oz (68.1 kg)  Height: 5\' 2"  (1.575 m)    This visit occurred during the SARS-CoV-2 public health emergency.  Safety protocols were in place, including screening questions prior to the visit, additional usage of staff PPE, and extensive cleaning of exam room while observing appropriate contact time as indicated for  disinfecting solutions.   Assessment & Plan:

## 2021-08-29 DIAGNOSIS — H9209 Otalgia, unspecified ear: Secondary | ICD-10-CM | POA: Insufficient documentation

## 2021-08-29 NOTE — Assessment & Plan Note (Signed)
New problem with concerns for sinus infection. Rx augmentin.

## 2021-09-05 ENCOUNTER — Encounter: Payer: Self-pay | Admitting: *Deleted

## 2021-09-05 ENCOUNTER — Telehealth: Payer: Self-pay | Admitting: *Deleted

## 2021-09-05 ENCOUNTER — Telehealth: Payer: Medicare Other

## 2021-09-05 NOTE — Telephone Encounter (Signed)
  Chronic Care Management   Follow Up Note   09/05/2021 Name: Emily Bentley MRN: [id number] DOB: [birth year]   Referred by: [name], MD Reason for referral : Chronic Care Management (CCM RN CM Initial Outreach attempt- Unsuccessful)  An unsuccessful telephone outreach was attempted today. The patient was referred to the case management team for assistance with care management and care coordination.   Follow Up Plan:  A HIPPA compliant phone message was left for the patient providing contact information and requesting a return call Will place request with scheduling care guide to contact patient to re-schedule today's missed CCM RN initial telephone appointment-- received subsequent secure message through EHR that patient had contacted scheduling care guide to re-schedule today's telephone appointment   Oneta Rack, RN, BSN, [place] [phone]: direct office [phone]: mobile

## 2021-09-15 ENCOUNTER — Ambulatory Visit: Payer: Medicare Other | Admitting: *Deleted

## 2021-09-15 DIAGNOSIS — G8929 Other chronic pain: Secondary | ICD-10-CM

## 2021-09-15 DIAGNOSIS — M5134 Other intervertebral disc degeneration, thoracic region: Secondary | ICD-10-CM

## 2021-09-15 DIAGNOSIS — E559 Vitamin D deficiency, unspecified: Secondary | ICD-10-CM

## 2021-09-15 DIAGNOSIS — M8580 Other specified disorders of bone density and structure, unspecified site: Secondary | ICD-10-CM

## 2021-09-15 DIAGNOSIS — F419 Anxiety disorder, unspecified: Secondary | ICD-10-CM

## 2021-09-15 DIAGNOSIS — K219 Gastro-esophageal reflux disease without esophagitis: Secondary | ICD-10-CM

## 2021-09-15 NOTE — Chronic Care Management (AMB) (Signed)
Chronic Care Management   CCM RN Visit Note  09/15/2021 Name: Emily Bentley MRN: [id number] DOB: 1935/09/02  Subjective: Emily Bentley is a 86 y.o. year old female who is a primary care patient of [name], MD. The care management team was consulted for assistance with disease management and care coordination needs.    Engaged with patient and her grand-daughter [name], on [facility] D[facility] by telephone for initial visit in response to provider referral for case management and/or care coordination services.   Consent to Services:  The patient was given information about Chronic Care Management services, agreed to services, and gave verbal consent 08/20/21 prior to initiation of services.  Please see initial visit note for detailed documentation.  Patient agreed to services and verbal consent obtained.   Assessment: Review of patient past medical history, allergies, medications, health status, including review of consultants reports, laboratory and other test data, was performed as part of comprehensive evaluation and provision of chronic care management services.   SDOH (Social Determinants of Health) assessments and interventions performed:  SDOH Interventions    Flowsheet Row Most Recent Value  SDOH Interventions   Food Insecurity Interventions Intervention Not Indicated  Housing Interventions Intervention Not Indicated  Transportation Interventions Intervention Not Indicated  [Patient drives self]       CCM Care Plan Allergies  Allergen Reactions   Sulfamethoxazole-Trimethoprim     Other reaction(s): Unknown   Outpatient Encounter Medications as of 09/15/2021  Medication Sig Note   amoxicillin-clavulanate (AUGMENTIN) 875-125 MG tablet Take 1 tablet by mouth 2 (two) times daily. 09/15/2021: 09/15/21-- Reports took as instructed; doses now complete   acetaminophen (TYLENOL) 650 MG CR tablet Take 650 mg by mouth every 8 (eight) hours as needed for pain. (Patient not taking:  Reported on 08/28/2021)    ALPRAZolam (XANAX) 0.5 MG tablet Take 1 tablet (0.5 mg total) by mouth 3 (three) times daily as needed for anxiety. (Patient not taking: Reported on 08/28/2021)    azelastine (ASTELIN) 0.1 % nasal spray Place 2 sprays into both nostrils 2 (two) times daily.    esomeprazole (NEXIUM) 20 MG capsule Take 20 mg by mouth daily at 12 noon. (Patient not taking: Reported on 08/28/2021)    fluticasone (FLONASE) 50 MCG/ACT nasal spray Place 2 sprays into both nostrils daily. (Patient not taking: Reported on 08/28/2021)    loratadine (CLARITIN) 10 MG tablet Take 10 mg by mouth daily.    magnesium hydroxide (MILK OF MAGNESIA) 400 MG/5ML suspension Take 30 mLs by mouth daily as needed for mild constipation. (Patient not taking: Reported on 08/28/2021)    montelukast (SINGULAIR) 10 MG tablet Take 1 tablet (10 mg total) by mouth at bedtime. (Patient not taking: Reported on 08/28/2021)    multivitamin-iron-minerals-folic acid (CENTRUM) chewable tablet Chew 1 tablet by mouth daily. (Patient not taking: Reported on 08/28/2021)    nystatin (MYCOSTATIN) 100000 UNIT/ML suspension Take 5 mLs (500,000 Units total) by mouth in the morning and at bedtime.    Sennosides (SENOKOT EXTRA STRENGTH) 17.2 MG TABS Take 1 tablet (17.2 mg total) by mouth daily. (Patient not taking: Reported on 08/28/2021)    sertraline (ZOLOFT) 50 MG tablet Take 1 tablet (50 mg total) by mouth daily. (Patient taking differently: Take 25 mg by mouth daily.)    traMADol (ULTRAM) 50 MG tablet TAKE 1/2 TO 1 TABLET BY MOUTH TWICE DAILY (Patient not taking: Reported on 08/28/2021)    No facility-administered encounter medications on file as of 09/15/2021.   Patient Active  Problem List   Diagnosis Date Noted   Ear pain 08/29/2021   Allergic rhinitis due to pollen 07/30/2021   Neck swelling 12/23/2020   Laryngopharyngeal reflux 10/28/2020   Vaginal prolapse 10/18/2020   Vitamin D deficiency 08/20/2020   Tingling 08/20/2020    Myalgia 08/20/2020   Bone pain 08/20/2020   Bilateral thoracic back pain 08/20/2020   SBO (small bowel obstruction) ([place]) 05/07/2020   Small bowel obstruction ([place]) 05/07/2020   Dysphagia 01/04/2020   Chronic bilateral thoracic back pain 09/28/2019   Scalp cyst 09/28/2019   Allergic rhinitis 09/28/2019   Constipation 08/25/2018   Elevated blood pressure reading in office without diagnosis of hypertension 08/25/2018   Anxiety 02/04/2018   Fatigue 12/15/2016   Degenerative disc disease, lumbar 09/16/2015   Routine general medical examination at a health care facility 07/02/2013   H/O supraventricular tachycardia 08/25/2012   Bilateral sensorineural hearing loss 07/28/2012   OAB (overactive bladder) 05/25/2012   Osteopenia 10/09/2009   Cochlear implant in place 12/30/2007   PSVT 12/30/2007   GERD (gastroesophageal reflux disease) 12/30/2007   Conditions to be addressed/monitored:  Anxiety and Vitamin D deficiency in history of falls  Care Plan : RN Care Manager Plan of Care  Updates made by Knox Royalty, RN since 09/15/2021 12:00 AM     Problem: Chronic Disease Management Needs   Priority: High     Long-Range Goal: Patient-Specific Goal   Start Date: 09/15/2021  Expected End Date: 09/15/2022  This Visit's Progress: On track  Priority: High  Note:   Current Barriers:  Chronic Disease Management support and education needs related to Anxiety and Vitamin D deficiency in setting of history of falls  RNCM Clinical Goal(s):  Patient will demonstrate Ongoing health management independence anxiety, vitamin D deficiency in setting of prior fall history  through collaboration with RN Care manager, provider, and care team.   Interventions: 1:1 collaboration with primary care provider regarding development and update of comprehensive plan of care as evidenced by provider attestation and co-signature Inter-disciplinary care team collaboration (see longitudinal plan of care) Evaluation  of current treatment plan related to  self management and patient's adherence to plan as established by provider  Falls in setting of Vitamin D deficiency Interventions: Provided written and verbal education re: potential causes of falls and Fall prevention strategies; risks/ complications of falls discussed  Advised patient of importance of notifying provider of falls; Assessed for signs and symptoms of orthostatic hypotension; none per patient report Advised patient to discuss ongoing issues with equilibrium/ ears with provider during upcoming PCP office visit 09/17/21; Confirmed patient taking OTC Vitamin D- this was encouraged Screening for signs and symptoms of depression related to chronic disease state;  Assessed social determinant of health barriers;  Encouraged patient to consider using assistive devices for fall prevention, given she is telling me today that her ears are causing her equilibrium to be "off;" provided education around safety considerations with equilibrium issues- she confirms that a friend will be driving her to upcoming PCP provider office visit 09/17/21 Confirmed patient obtained flu vaccine for 2022-23 flu/ winter season Confirmed patient self-manages medications at home and denies medication concerns  Anxiety management New goal. Evaluation of current treatment plan related to Anxiety, self-management and patient's adherence to plan as established by provider. Discussed plans with patient for ongoing care management follow up and provided patient with direct contact information for care management team Evaluation of current treatment plan related to anxiety/ depression and patient's adherence to plan  as established by provider; Advised patient to discuss anti-anxiety and antidepressant medications with PCP- has upcoming scheduled PCP appointment 09/17/21- today, reports she has not been taking Xanax NOR sertraline; Reviewed scheduled/upcoming provider appointments  including; Discussed plans with patient for ongoing care management follow up and provided patient with direct contact information for care management team;  Confirmed patient believes her self- management of anxiety/ depression, "is better;" than previously when she had PCP office visit 08/28/21: she reports she has continued not taking Xanax, and also reports not taking sertraline-- discussed basic difference in purpose of these 2 medications- encouraged her to inform PCP that she stopped taking Sertraline at time of 08/28/21 PCP office visit, per her report to me today Confirmed patient obtained referral to a counselor in [place] at time of PCP office visit 08/28/21-- today, she tells me this is "too far for" her to drive- states she plans to request another closer option on PCP office visit 09/17/21 Confirmed patient staying busy at home to manage anxiety: reports "sleeping well; visits with friend daily; completes an app for adult coloring book, which 'really helps,' drinks anti-anxiety tea and eats anti-anxiety gummies."  Encouraged patient to continue these practices, given she is telling me today that they are helpful  Patient Goals/Self-Care Activities: As evidenced by review of EHR, collaboration with care team, and patient reporting during CCM RN CM outreach, Patient  Ari will: Self administer medications as prescribed Attend all scheduled provider appointments Call pharmacy for medication refills Call provider office for new concerns or questions Review enclosed educational material around fall prevention Continue the strategies she has developed to decrease her anxiety levels at home Talk to Dr. Sharlet Salina about counselors that are closer to your home  Follow Up Plan:   Telephone follow up appointment with care management team, patient and her granddaughter member scheduled for:  Friday, October 17, 2021 at 11:30 am The patient has been provided with contact information for the  care management team and has been advised to call with any health related questions or concerns     Plan: The patient has been provided with contact information for the care management team and has been advised to call with any health related questions or concerns  Oneta Rack, RN, BSN, [facility] RN Care Coordination- [phone]: direct office [phone]: mobile

## 2021-09-15 NOTE — Patient Instructions (Addendum)
Visit Information   Emily Bentley and Emily Bentley, it was nice talking with you today.   Please read over the attached information, and please continue your efforts at home to prevent falls   I look forward to talking to you again for a telephone update on Friday, October 17, 2021 at 11:30 am- please be listening out for my call that day.  I will call as close to 11:30 am as possible.   If you need to cancel or re-schedule our telephone visit, please call [phone] and one of our care guides will be happy to assist you.   I look forward to hearing about your progress.   Please don't hesitate to contact me if I can be of assistance to you before our next scheduled telephone appointment.   Oneta Rack, RN, BSN, [facility] RN Care Coordination- [place] [phone]: direct office [phone]: mobile   Patient Self-Care Activities: Patient  Emily Bentley will: Self administer medications as prescribed Attend all scheduled provider appointments Call pharmacy for medication refills Call provider office for new concerns or questions Review enclosed educational material around fall prevention Continue the strategies she has developed to decrease her anxiety levels at home Talk to Dr. Sharlet Salina about counselors that are closer to your home  Understanding Your Risk for Falls Each year, millions of people have serious injuries from falls. It is important to understand your risk for falling. Talk with your health care provider about your risk and what you can do to lower it. There are actions you can take at home to lower your risk and prevent falls. If you do have a serious fall, make sure to tell your health care provider. Falling once raises your risk of falling again. How can falls affect me? Serious injuries from falls are common. These include: Broken bones, such as hip fractures. Head injuries, such as traumatic brain injuries (TBI) or concussion. A fear of falling  can cause you to avoid activities and stay at home. This can make your muscles weaker and actually raise your risk for a fall. What can increase my risk? There are a number of risk factors that increase your risk for falling. The more risk factors you have, the higher your risk of falling. Serious injuries from a fall happen most often to people older than age 39. Children and young adults ages 44-29 are also at higher risk. Common risk factors include: Weakness in the lower body. Lack (deficiency) of vitamin D. Being generally weak or confused due to long-term (chronic) illness. Dizziness or balance problems. Poor vision. Medicines that cause dizziness or drowsiness. These can include medicines for your blood pressure, heart, anxiety, insomnia, or edema, as well as pain medicines and muscle relaxants. Other risk factors include: Drinking alcohol. Having had a fall in the past. Having depression. Having foot pain or wearing improper footwear. Working at a dangerous job. Having any of the following in your home: Tripping hazards, such as floor clutter or loose rugs. Poor lighting. Pets. Having dementia or memory loss. What actions can I take to lower my risk of falling?   Physical activity Maintain physical fitness. Do strength and balance exercises. Consider taking a regular class to build strength and balance. Yoga and tai chi are good options. Vision Have your eyes checked every year and your vision prescription updated as needed. Walking aids and footwear Wear nonskid shoes. Do not wear high heels. Do not walk around the house in socks or slippers. Use  a cane or walker as told by your health care provider. Home safety Attach secure railings on both sides of your stairs. Install grab bars for your tub, shower, and toilet. Use a bath mat in your tub or shower. Use good lighting in all rooms. Keep a flashlight near your bed. Make sure there is a clear path from your bed to the  bathroom. Use night-lights. Do not use throw rugs. Make sure all carpeting is taped or tacked down securely. Remove all clutter from walkways and stairways, including extension cords. Repair uneven or broken steps. Avoid walking on icy or slippery surfaces. Walk on the grass instead of on icy or slick sidewalks. Use ice melt to get rid of ice on walkways. Use a cordless phone. Questions to ask your health care provider Can you help me check my risk for a fall? Do any of my medicines make me more likely to fall? Should I take a vitamin D supplement? What exercises can I do to improve my strength and balance? Should I make an appointment to have my vision checked? Do I need a bone density test to check for weak bones or osteoporosis? Would it help to use a cane or a walker? Where to find more information Centers for Disease Control and Prevention, [employer]AD[employer]: http://www.wolf.info/ Community-Based Fall Prevention Programs: http://www.wolf.info/ National Institute on Aging: http://kim-miller.com/ Contact a health care provider if: You fall at home. You are afraid of falling at home. You feel weak, drowsy, or dizzy. Summary Serious injuries from a fall happen most often to people older than age 60. Children and young adults ages 26-29 are also at higher risk. Talk with your health care provider about your risks for falling and how to lower those risks. Taking certain precautions at home can lower your risk for falling. If you fall, always tell your health care provider. This information is not intended to replace advice given to you by your health care provider. Make sure you discuss any questions you have with your health care provider. Document Revised: 05/29/2020 Document Reviewed: 05/29/2020 Elsevier Patient Education  [place].  PATIENT GOALS/PLAN OF CARE:  Care Plan : RN Care Manager Plan of Care  Updates made by Knox Royalty, RN since 09/15/2021 12:00 AM     Problem: Chronic Disease Management  Needs   Priority: High     Long-Range Goal: Patient-Specific Goal   Start Date: 09/15/2021  Expected End Date: 09/15/2022  This Visit's Progress: On track  Priority: High  Note:   Current Barriers:  Chronic Disease Management support and education needs related to Anxiety and Vitamin D deficiency in setting of history of falls  RNCM Clinical Goal(s):  Patient will demonstrate Ongoing health management independence anxiety, vitamin D deficiency in setting of prior fall history  through collaboration with RN Care manager, provider, and care team.   Interventions: 1:1 collaboration with primary care provider regarding development and update of comprehensive plan of care as evidenced by provider attestation and co-signature Inter-disciplinary care team collaboration (see longitudinal plan of care) Evaluation of current treatment plan related to  self management and patient's adherence to plan as established by provider  Falls in setting of Vitamin D deficiency Interventions: Provided written and verbal education re: potential causes of falls and Fall prevention strategies; risks/ complications of falls discussed  Advised patient of importance of notifying provider of falls; Assessed for signs and symptoms of orthostatic hypotension; none per patient report Advised patient to discuss ongoing issues with equilibrium/  ears with provider during upcoming PCP office visit 09/17/21; Confirmed patient taking OTC Vitamin D- this was encouraged Screening for signs and symptoms of depression related to chronic disease state;  Assessed social determinant of health barriers;  Encouraged patient to consider using assistive devices for fall prevention, given she is telling me today that her ears are causing her equilibrium to be "off;" provided education around safety considerations with equilibrium issues- she confirms that a friend will be driving her to upcoming PCP provider office visit  09/17/21 Confirmed patient obtained flu vaccine for 2022-23 flu/ winter season Confirmed patient self-manages medications at home and denies medication concerns  Anxiety management New goal. Evaluation of current treatment plan related to Anxiety, self-management and patient's adherence to plan as established by provider. Discussed plans with patient for ongoing care management follow up and provided patient with direct contact information for care management team Evaluation of current treatment plan related to anxiety/ depression and patient's adherence to plan as established by provider; Advised patient to discuss anti-anxiety and antidepressant medications with PCP- has upcoming scheduled PCP appointment 09/17/21- today, reports she has not been taking Xanax NOR sertraline; Reviewed scheduled/upcoming provider appointments including; Discussed plans with patient for ongoing care management follow up and provided patient with direct contact information for care management team;  Confirmed patient believes her self- management of anxiety/ depression, "is better;" than previously when she had PCP office visit 08/28/21: she reports she has continued not taking Xanax, and also reports not taking sertraline-- discussed basic difference in purpose of these 2 medications- encouraged her to inform PCP that she stopped taking Sertraline at time of 08/28/21 PCP office visit, per her report to me today Confirmed patient obtained referral to a counselor in [place] at time of PCP office visit 08/28/21-- today, she tells me this is "too far for" her to drive- states she plans to request another closer option on PCP office visit 09/17/21 Confirmed patient staying busy at home to manage anxiety: reports "sleeping well; visits with friend daily; completes an app for adult coloring book, which 'really helps,' drinks anti-anxiety tea and eats anti-anxiety gummies."  Encouraged patient to continue these practices,  given she is telling me today that they are helpful  Patient Goals/Self-Care Activities: As evidenced by review of EHR, collaboration with care team, and patient reporting during CCM RN CM outreach, Patient  Emily Bentley will: Self administer medications as prescribed Attend all scheduled provider appointments Call pharmacy for medication refills Call provider office for new concerns or questions Review enclosed educational material around fall prevention Continue the strategies she has developed to decrease her anxiety levels at home Talk to Dr. Sharlet Salina about counselors that are closer to your home   Follow Up Plan:   Telephone follow up appointment with care management team, patient and her granddaughter member scheduled for:  Friday, October 17, 2021 at 11:30 am The patient has been provided with contact information for the care management team and has been advised to call with any health related questions or concerns     Consent to CCM Services: Ms. Levitan was given information about Chronic Care Management services including:  CCM service includes personalized support from designated clinical staff supervised by her physician, including individualized plan of care and coordination with other care providers 24/7 contact phone numbers for assistance for urgent and routine care needs. Service will only be billed when office clinical staff spend 20 minutes or more in a month to coordinate care. Only one practitioner may furnish  and bill the service in a calendar month. The patient may stop CCM services at any time (effective at the end of the month) by phone call to the office staff. The patient will be responsible for cost sharing (co-pay) of up to 20% of the service fee (after annual deductible is met).  Patient agreed to services and verbal consent obtained.   Patient verbalizes understanding of instructions provided today and agrees to view in MyChart Telephone follow up appointment  with care management team member scheduled for:  Friday, October 17, 2021 at 11:30 am The patient has been provided with contact information for the care management team and has been advised to call with any health related questions or concerns   Oneta Rack, RN, BSN, [place] [phone]: direct office [phone]: mobile

## 2021-09-17 ENCOUNTER — Encounter: Payer: Self-pay | Admitting: Internal Medicine

## 2021-09-17 ENCOUNTER — Other Ambulatory Visit: Payer: Self-pay

## 2021-09-17 ENCOUNTER — Ambulatory Visit (INDEPENDENT_AMBULATORY_CARE_PROVIDER_SITE_OTHER): Payer: Medicare Other | Admitting: Internal Medicine

## 2021-09-17 DIAGNOSIS — M546 Pain in thoracic spine: Secondary | ICD-10-CM | POA: Diagnosis not present

## 2021-09-17 DIAGNOSIS — G8929 Other chronic pain: Secondary | ICD-10-CM | POA: Diagnosis not present

## 2021-09-17 DIAGNOSIS — H9203 Otalgia, bilateral: Secondary | ICD-10-CM

## 2021-09-17 DIAGNOSIS — F419 Anxiety disorder, unspecified: Secondary | ICD-10-CM

## 2021-09-17 MED ORDER — NEOMYCIN-POLYMYXIN-HC 3.5-10000-1 OT SOLN: 3.0000 [drp] | Freq: Three times a day (TID) | OTIC | 0 refills | Status: DC

## 2021-09-17 MED ORDER — TRAMADOL HCL 50 MG PO TABS: ORAL_TABLET | ORAL | 4 refills | Status: DC

## 2021-09-17 NOTE — Progress Notes (Signed)
   Subjective:   Patient ID: Emily Bentley, female    DOB: 03/12/1935, 86 y.o.   MRN: [id number]  HPI The patient is an 86 YO female coming in for ear pain both ears. Has cochlear implant. Also concerns about chronic concerns.   Review of Systems  Constitutional: Negative.   HENT:  Positive for ear pain.   Eyes: Negative.   Respiratory:  Negative for cough, chest tightness and shortness of breath.   Cardiovascular:  Negative for chest pain, palpitations and leg swelling.  Gastrointestinal:  Negative for abdominal distention, abdominal pain, constipation, diarrhea, nausea and vomiting.  Musculoskeletal:  Positive for arthralgias.  Skin: Negative.   Neurological: Negative.   Psychiatric/Behavioral:  The patient is nervous/anxious.    Objective:  Physical Exam Constitutional:      Appearance: She is well-developed.  HENT:     Head: Normocephalic and atraumatic.     Right Ear: Ear canal normal.     Left Ear: Ear canal normal.     Ears:     Comments: TM bulging clear fluid Cardiovascular:     Rate and Rhythm: Normal rate and regular rhythm.  Pulmonary:     Effort: Pulmonary effort is normal. No respiratory distress.     Breath sounds: Normal breath sounds. No wheezing or rales.  Abdominal:     General: Bowel sounds are normal. There is no distension.     Palpations: Abdomen is soft.     Tenderness: There is no abdominal tenderness. There is no rebound.  Musculoskeletal:     Cervical back: Normal range of motion.  Skin:    General: Skin is warm and dry.  Neurological:     Mental Status: She is alert and oriented to person, place, and time.     Coordination: Coordination normal.    Vitals:   09/17/21 1037  Resp: 18  Weight: 153 lb (69.4 kg)  Height: 5\' 2"  (1.575 m)    This visit occurred during the SARS-CoV-2 public health emergency.  Safety protocols were in place, including screening questions prior to the visit, additional usage of staff PPE, and extensive cleaning  of exam room while observing appropriate contact time as indicated for disinfecting solutions.   Assessment & Plan:

## 2021-09-17 NOTE — Patient Instructions (Signed)
We have sent in some ear drops to use 3 drops 3 times a day for 1 week.   Call us if this is not helping.  It is okay to not take the sertraline.

## 2021-09-19 ENCOUNTER — Encounter: Payer: Self-pay | Admitting: Internal Medicine

## 2021-09-19 NOTE — Assessment & Plan Note (Signed)
Some fluid on exam. She is seeing her hearing aid specialist in the next week or so. Rx cortisporin ear drops and if no improvement will need to see ENT.

## 2021-09-19 NOTE — Assessment & Plan Note (Signed)
She had been taking sertraline prn and educated her on how this works and the timeline for expected benefits. She is using xanax prn rarely and wishes to continue with this treatment only. Will D/C sertraline and continue xanax prn and she understands if xanax becomes more often we will discuss this again.

## 2021-09-19 NOTE — Assessment & Plan Note (Signed)
She needs refill on tramadol which is done during visit. [place] database reviewed and no inappropriate fills. She understands the risk of constipation, sedation, fall risk, memory change and wishes to proceed.

## 2021-10-07 DIAGNOSIS — Z23 Encounter for immunization: Secondary | ICD-10-CM | POA: Diagnosis not present

## 2021-10-17 ENCOUNTER — Encounter: Payer: Self-pay | Admitting: *Deleted

## 2021-10-17 ENCOUNTER — Telehealth: Payer: Self-pay | Admitting: *Deleted

## 2021-10-17 ENCOUNTER — Telehealth: Payer: Medicare Other

## 2021-10-17 NOTE — Telephone Encounter (Signed)
  Care Management   Follow Up Note   10/17/2021 Name: Emily Bentley MRN: [id number] DOB: [birth year]  Referred by: [name], MD Reason for referral : Care Coordination (CCM RN CM Follow Up Telephone Visit; osteopenia; anxiety; Vitamin D deficiency)  An unsuccessful telephone outreach was attempted today. The patient was referred to the case management team for assistance with care management and care coordination.   Follow Up Plan:  A HIPPA compliant phone message was left for the patient providing contact information and requesting a return call Will place request with scheduling care guide to contact patient to re-schedule today's missed CCM RN follow up telephone appointment if I do not hear back from patient by end of day  Oneta Rack, RN, BSN, [place] [phone]: direct office

## 2021-10-20 ENCOUNTER — Telehealth: Payer: Self-pay | Admitting: *Deleted

## 2021-10-20 NOTE — Chronic Care Management (AMB) (Signed)
  Care Management   Note  10/20/2021 Name: Emily Bentley MRN: [id number] DOB: 07-Oct-1935  Emily Bentley is a 86 y.o. year old female who is a primary care patient of [name], MD and is actively engaged with the care management team. I reached out to [name] by phone today to assist with re-scheduling a follow up visit with the RN Case Manager  Follow up plan: Unsuccessful telephone outreach attempt made. A HIPAA compliant phone message was left for the patient providing contact information and requesting a return call.  The care management team will reach out to the patient again over the next 7 days.  If patient returns call to provider office, please advise to call [place] at [phone].  [facility] Management  Direct Dial: [phone]

## 2021-10-21 DIAGNOSIS — H353131 Nonexudative age-related macular degeneration, bilateral, early dry stage: Secondary | ICD-10-CM | POA: Diagnosis not present

## 2021-10-21 DIAGNOSIS — H814 Vertigo of central origin: Secondary | ICD-10-CM | POA: Diagnosis not present

## 2021-10-21 DIAGNOSIS — H26491 Other secondary cataract, right eye: Secondary | ICD-10-CM | POA: Diagnosis not present

## 2021-10-27 NOTE — Chronic Care Management (AMB) (Signed)
°  Care Management   Note  10/27/2021 Name: Emily Bentley MRN: [id number] DOB: 11/15/1934  Emily Bentley is a 86 y.o. year old female who is a primary care patient of [name], MD and is actively engaged with the care management team. I reached out to [name] by phone today to assist with re-scheduling a follow up visit with the RN Case Manager  Follow up plan: A second unsuccessful telephone outreach attempt made. A HIPAA compliant phone message was left for the patient providing contact information and requesting a return call. The care management team will reach out to the patient again over the next 7 days.  If patient returns call to provider office, please advise to call [place] at [phone].  [place] Management  Direct Dial: [phone]

## 2021-10-28 DIAGNOSIS — Z45321 Encounter for adjustment and management of cochlear device: Secondary | ICD-10-CM | POA: Diagnosis not present

## 2021-11-04 ENCOUNTER — Ambulatory Visit: Payer: Self-pay | Admitting: *Deleted

## 2021-11-04 DIAGNOSIS — F419 Anxiety disorder, unspecified: Secondary | ICD-10-CM

## 2021-11-04 DIAGNOSIS — E559 Vitamin D deficiency, unspecified: Secondary | ICD-10-CM

## 2021-11-04 NOTE — Chronic Care Management (AMB) (Signed)
°  Care Management   Note  11/04/2021 Name: BAILIE CHRISTENBURY MRN: [id number] DOB: 05-Apr-1935  DOROTHEA YOW is a 86 y.o. year old female who is a primary care patient of [name], MD and is actively engaged with the care management team. I reached out to [name] by phone today to assist with re-scheduling a follow up visit with the RN Case Manager  Follow up plan: A third unsuccessful telephone outreach attempt made. A HIPAA compliant phone message was left for the patient providing contact information and requesting a return call. Unable to make contact on outreach attempts x 3. PCP Dr. Sharlet Salina notified via routed documentation in medical record. We have been unable to make contact with the patient for follow up. The care management team is available to follow up with the patient after provider conversation with the patient regarding recommendation for care management engagement and subsequent re-referral to the care management team. If patient returns call to provider office, please advise to call [place] at [phone].  [place] Management  Direct Dial: [phone]

## 2021-11-04 NOTE — Chronic Care Management (AMB) (Cosign Needed)
Care Management    RN Visit Note  11/04/2021 Name: ADELEIGH BARLETTA MRN: [id number] DOB: Dec 22, 1934  Subjective: Emily Bentley is a 86 y.o. year old female who is a primary care patient of [name], MD. The care management team was consulted for assistance with disease management and care coordination needs.    Collaboration with CM scheduling care guide  for  case closure  in response to provider referral for case management and/or care coordination services.   11/04/21: case Closure: received care coordination outreach from [facility] scheduling care guide; patient has not returned calls to re-schedule previously missed appointment with RN CM; will close patient case/ care plan accordingly- unable to maintain contact with patient  Consent to Services:   Ms. Chai was given information about Care Management services 08/20/21 including:  Care Management services includes personalized support from designated clinical staff supervised by her physician, including individualized plan of care and coordination with other care providers 24/7 contact phone numbers for assistance for urgent and routine care needs. The patient may stop case management services at any time by phone call to the office staff.  Patient agreed to services and consent obtained.   Assessment: Review of patient past medical history, allergies, medications, health status, including review of consultants reports, laboratory and other test data, was performed as part of comprehensive evaluation and provision of chronic care management services.  Care Plan  Allergies  Allergen Reactions   Sulfamethoxazole-Trimethoprim     Other reaction(s): Unknown   Outpatient Encounter Medications as of 11/04/2021  Medication Sig Note   acetaminophen (TYLENOL) 650 MG CR tablet Take 650 mg by mouth every 8 (eight) hours as needed for pain.    ALPRAZolam (XANAX) 0.5 MG tablet Take 1 tablet (0.5 mg total) by mouth 3 (three) times  daily as needed for anxiety.    amoxicillin-clavulanate (AUGMENTIN) 875-125 MG tablet Take 1 tablet by mouth 2 (two) times daily. 09/15/2021: 09/15/21-- Reports took as instructed; doses now complete   azelastine (ASTELIN) 0.1 % nasal spray Place 2 sprays into both nostrils 2 (two) times daily.    esomeprazole (NEXIUM) 20 MG capsule Take 20 mg by mouth daily at 12 noon.    fluticasone (FLONASE) 50 MCG/ACT nasal spray Place 2 sprays into both nostrils daily.    loratadine (CLARITIN) 10 MG tablet Take 10 mg by mouth daily.    magnesium hydroxide (MILK OF MAGNESIA) 400 MG/5ML suspension Take 30 mLs by mouth daily as needed for mild constipation.    montelukast (SINGULAIR) 10 MG tablet Take 1 tablet (10 mg total) by mouth at bedtime.    multivitamin-iron-minerals-folic acid (CENTRUM) chewable tablet Chew 1 tablet by mouth daily.    neomycin-polymyxin-hydrocortisone (CORTISPORIN) OTIC solution Place 3 drops into both ears 3 (three) times daily.    nystatin (MYCOSTATIN) 100000 UNIT/ML suspension Take 5 mLs (500,000 Units total) by mouth in the morning and at bedtime.    Sennosides (SENOKOT EXTRA STRENGTH) 17.2 MG TABS Take 1 tablet (17.2 mg total) by mouth daily.    traMADol (ULTRAM) 50 MG tablet TAKE 1/2 TO 1 TABLET BY MOUTH TWICE DAILY    No facility-administered encounter medications on file as of 11/04/2021.   Patient Active Problem List   Diagnosis Date Noted   Ear pain 08/29/2021   Allergic rhinitis due to pollen 07/30/2021   Neck swelling 12/23/2020   Laryngopharyngeal reflux 10/28/2020   Vaginal prolapse 10/18/2020   Vitamin D deficiency 08/20/2020   Tingling 08/20/2020  Myalgia 08/20/2020   Bone pain 08/20/2020   Bilateral thoracic back pain 08/20/2020   SBO (small bowel obstruction) ([place]) 05/07/2020   Small bowel obstruction ([place]) 05/07/2020   Dysphagia 01/04/2020   Chronic bilateral thoracic back pain 09/28/2019   Scalp cyst 09/28/2019   Allergic rhinitis 09/28/2019    Constipation 08/25/2018   Elevated blood pressure reading in office without diagnosis of hypertension 08/25/2018   Anxiety 02/04/2018   Fatigue 12/15/2016   Degenerative disc disease, lumbar 09/16/2015   Routine general medical examination at a health care facility 07/02/2013   H/O supraventricular tachycardia 08/25/2012   Bilateral sensorineural hearing loss 07/28/2012   OAB (overactive bladder) 05/25/2012   Osteopenia 10/09/2009   Cochlear implant in place 12/30/2007   PSVT 12/30/2007   GERD (gastroesophageal reflux disease) 12/30/2007   Conditions to be addressed/monitored:   Anxiety and frequent falls  Care Plan : RN Care Manager Plan of Care  Updates made by Knox Royalty, RN since 11/04/2021 12:00 AM     Problem: Chronic Disease Management Needs   Priority: High     Long-Range Goal: Patient-Specific Goal   Start Date: 09/15/2021  Expected End Date: 09/15/2022  Recent Progress: On track  Priority: High  Note:   Current Barriers:  Chronic Disease Management support and education needs related to Anxiety and Vitamin D deficiency in setting of history of falls  RNCM Clinical Goal(s):  Patient will demonstrate Ongoing health management independence anxiety, vitamin D deficiency in setting of prior fall history  through collaboration with RN Care manager, provider, and care team.   Interventions: 1:1 collaboration with primary care provider regarding development and update of comprehensive plan of care as evidenced by provider attestation and co-signature Inter-disciplinary care team collaboration (see longitudinal plan of care) Evaluation of current treatment plan related to  self management and patient's adherence to plan as established by provider  11/04/21: case Closure: received care coordination outreach from [facility] scheduling care guide; patient has not returned calls to re-schedule previously missed appointment with RN CM; will close patient case/ care plan accordingly-  unable to maintain contact with patient    Plan:  No further follow up required: case closure: unable to maintain contact with patient  Oneta Rack, RN, BSN, [place] [phone]: direct office

## 2021-11-27 ENCOUNTER — Other Ambulatory Visit: Payer: Self-pay | Admitting: Internal Medicine

## 2021-11-28 NOTE — Telephone Encounter (Signed)
Should still have refills #[phone] refills sent in Nov 2022

## 2021-12-23 DIAGNOSIS — Z1231 Encounter for screening mammogram for malignant neoplasm of breast: Secondary | ICD-10-CM | POA: Diagnosis not present

## 2021-12-29 ENCOUNTER — Ambulatory Visit
Admission: EM | Admit: 2021-12-29 | Discharge: 2021-12-29 | Disposition: A | Discharge status: Home / Self Care | Payer: Medicare Other | Attending: Internal Medicine | Admitting: Internal Medicine

## 2021-12-29 ENCOUNTER — Other Ambulatory Visit: Payer: Self-pay

## 2021-12-29 ENCOUNTER — Encounter: Payer: Self-pay | Admitting: Emergency Medicine

## 2021-12-29 DIAGNOSIS — J069 Acute upper respiratory infection, unspecified: Secondary | ICD-10-CM | POA: Diagnosis not present

## 2021-12-29 MED ORDER — BENZONATATE 100 MG PO CAPS: 100.0000 mg | ORAL_CAPSULE | Freq: Three times a day (TID) | ORAL | 0 refills | Status: DC | PRN

## 2021-12-29 NOTE — Discharge Instructions (Signed)
It appears that you have a viral upper respiratory infection that should self resolve in the next few days.  You have been prescribed a cough medication to take as needed.  Please follow-up if symptoms persist or worsen.

## 2021-12-29 NOTE — ED Provider Notes (Signed)
[name]    [name]: [id number] Arrival date & time: 12/29/21  2979      History   Chief Complaint Chief Complaint  Patient presents with   Cough    HPI Emily Bentley is a 87 y.o. female.   Patient presents with cough and nasal congestion.  Patient reports that nasal congestion has been intermittent over the past year.  She has been followed by primary care and ENT for this.  She developed a cough approximately 1 week ago.  Cough is nonproductive per patient.  Denies any known fevers or sick contacts.  Patient denies chest pain, shortness of breath, sore throat, ear pain, nausea, vomiting, diarrhea, abdominal pain.  Patient has taken Mucinex for cough with some improvement.  Patient denies any known chronic lung conditions and does not smoke.   Cough  Past Medical History:  Diagnosis Date   Allergy    Arthritis    Asthma    Cataract    Chronic allergic rhinitis    with asthmatic component   Diverticulosis of colon (without mention of hemorrhage)    GERD (gastroesophageal reflux disease)    Hearing impairment    Hiatal hernia    History of bursitis    right shoulder   History of cochlear implant    Cant have an MRI   History of colon polyps    History of pneumonia    History of PSVT (paroxysmal supraventricular tachycardia)    Hormone replacement therapy (postmenopausal)    IBS (irritable bowel syndrome)    Peptic stricture of esophagus    Watermelon stomach     Patient Active Problem List   Diagnosis Date Noted   Ear pain 08/29/2021   Allergic rhinitis due to pollen 07/30/2021   Neck swelling 12/23/2020   Laryngopharyngeal reflux 10/28/2020   Vaginal prolapse 10/18/2020   Vitamin D deficiency 08/20/2020   Tingling 08/20/2020   Myalgia 08/20/2020   Bone pain 08/20/2020   Bilateral thoracic back pain 08/20/2020   SBO (small bowel obstruction) ([place]) 05/07/2020   Small bowel obstruction ([place]) 05/07/2020   Dysphagia 01/04/2020   Chronic  bilateral thoracic back pain 09/28/2019   Scalp cyst 09/28/2019   Allergic rhinitis 09/28/2019   Constipation 08/25/2018   Elevated blood pressure reading in office without diagnosis of hypertension 08/25/2018   Anxiety 02/04/2018   Fatigue 12/15/2016   Degenerative disc disease, lumbar 09/16/2015   Routine general medical examination at a health care facility 07/02/2013   H/O supraventricular tachycardia 08/25/2012   Bilateral sensorineural hearing loss 07/28/2012   OAB (overactive bladder) 05/25/2012   Osteopenia 10/09/2009   Cochlear implant in place 12/30/2007   PSVT 12/30/2007   GERD (gastroesophageal reflux disease) 12/30/2007    Past Surgical History:  Procedure Laterality Date   ABDOMINAL HYSTERECTOMY     APPENDECTOMY     CHOLECYSTECTOMY     COCHLEAR IMPLANT Left Oct '14   Done at [facility] - great results / Serial No: [id number] / Model: CI422   ESOPHAGOGASTRODUODENOSCOPY     EYE SURGERY     POLYPECTOMY     TUBAL LIGATION     urethral dilatations     VENTRICULAR ABLATION SURGERY      OB History     Gravida  5   Para  4   Term  4   Preterm      AB  1   Living  4      SAB  1   IAB  Ectopic      Multiple      Live Births  4            Home Medications    Prior to Admission medications   Medication Sig Start Date End Date Taking? Authorizing Provider  benzonatate (TESSALON) 100 MG capsule Take 1 capsule (100 mg total) by mouth every 8 (eight) hours as needed for cough. 12/29/21  Yes Kyshon Tolliver, Hildred Alamin E, FNP  acetaminophen (TYLENOL) 650 MG CR tablet Take 650 mg by mouth every 8 (eight) hours as needed for pain.    [provider]  ALPRAZolam Duanne Moron) 0.5 MG tablet Take 1 tablet (0.5 mg total) by mouth 3 (three) times daily as needed for anxiety. 01/17/21   [name], MD  amoxicillin-clavulanate (AUGMENTIN) 875-125 MG tablet Take 1 tablet by mouth 2 (two) times daily. 08/28/21   [name], MD   azelastine (ASTELIN) 0.1 % nasal spray Place 2 sprays into both nostrils 2 (two) times daily. 01/17/21   [name], MD  esomeprazole (NEXIUM) 20 MG capsule Take 20 mg by mouth daily at 12 noon.    [provider]  fluticasone (FLONASE) 50 MCG/ACT nasal spray Place 2 sprays into both nostrils daily. 01/17/21   [name], MD  loratadine (CLARITIN) 10 MG tablet Take 10 mg by mouth daily. 07/23/21   [provider]  magnesium hydroxide (MILK OF MAGNESIA) 400 MG/5ML suspension Take 30 mLs by mouth daily as needed for mild constipation.    [provider]  montelukast (SINGULAIR) 10 MG tablet Take 1 tablet (10 mg total) by mouth at bedtime. 10/16/20   [name], MD  multivitamin-iron-minerals-folic acid (CENTRUM) chewable tablet Chew 1 tablet by mouth daily.    [provider]  neomycin-polymyxin-hydrocortisone (CORTISPORIN) OTIC solution Place 3 drops into both ears 3 (three) times daily. 09/17/21   [name], MD  nystatin (MYCOSTATIN) 100000 UNIT/ML suspension Take 5 mLs (500,000 Units total) by mouth in the morning and at bedtime. 01/17/21   [name], MD  Sennosides (SENOKOT EXTRA STRENGTH) 17.2 MG TABS Take 1 tablet (17.2 mg total) by mouth daily. 05/10/20   Little Ishikawa, MD  traMADol Veatrice Bourbon) 50 MG tablet TAKE 1/2 TO 1 TABLET BY MOUTH TWICE DAILY 09/17/21   [name], MD    Family History Family History  Problem Relation Age of Onset   Diabetes Mother    Uterine cancer Mother    Coronary artery disease Father        CABG x 5 in 1985   Hypertension Father    Cancer Sister    Cancer Brother    Cancer Maternal Aunt    Colon cancer Neg Hx     Social History Social History   Tobacco Use   Smoking status: Never   Smokeless tobacco: Never  Vaping Use   Vaping Use: Never used  Substance Use Topics   Alcohol use: No   Drug use: No     Allergies    Sulfamethoxazole-trimethoprim   Review of Systems Review of Systems Per HPI  Physical Exam Triage Vital Signs ED Triage Vitals [12/29/21 0838]  Enc Vitals Group     BP 107/74     Pulse Rate 73     Resp 16     Temp 98.3 F (36.8 C)     Temp Source Oral     SpO2 96 %     Weight  Height      Head Circumference      Peak Flow      Pain Score 0     Pain Loc      Pain Edu?      Excl. in [place]?    No data found.  Updated Vital Signs BP 107/74 (BP Location: Left Arm)    Pulse 73    Temp 98.3 F (36.8 C) (Oral)    Resp 16    SpO2 96%   Visual Acuity Right Eye Distance:   Left Eye Distance:   Bilateral Distance:    Right Eye Near:   Left Eye Near:    Bilateral Near:     Physical Exam Constitutional:      General: She is not in acute distress.    Appearance: Normal appearance. She is not toxic-appearing or diaphoretic.  HENT:     Head: Normocephalic and atraumatic.     Right Ear: Tympanic membrane and ear canal normal.     Left Ear: Tympanic membrane and ear canal normal.     Nose: Congestion present.     Mouth/Throat:     Mouth: Mucous membranes are moist.     Pharynx: No posterior oropharyngeal erythema.  Eyes:     Extraocular Movements: Extraocular movements intact.     Conjunctiva/sclera: Conjunctivae normal.     Pupils: Pupils are equal, round, and reactive to light.  Cardiovascular:     Rate and Rhythm: Normal rate and regular rhythm.     Pulses: Normal pulses.     Heart sounds: Normal heart sounds.  Pulmonary:     Effort: Pulmonary effort is normal. No respiratory distress.     Breath sounds: Normal breath sounds. No stridor. No wheezing, rhonchi or rales.  Abdominal:     General: Abdomen is flat. Bowel sounds are normal.     Palpations: Abdomen is soft.  Musculoskeletal:        General: Normal range of motion.     Cervical back: Normal range of motion.  Skin:    General: Skin is warm and dry.  Neurological:     General: No focal deficit  present.     Mental Status: She is alert and oriented to person, place, and time. Mental status is at baseline.  Psychiatric:        Mood and Affect: Mood normal.        Behavior: Behavior normal.     UC Treatments / Results  Labs (all labs ordered are listed, but only abnormal results are displayed) Labs Reviewed  NOVEL CORONAVIRUS, NAA    EKG   Radiology No results found.  Procedures Procedures (including critical care time)  Medications Ordered in UC Medications - No data to display  Initial Impression / Assessment and Plan / UC Course  I have reviewed the triage vital signs and the nursing notes.  Pertinent labs & imaging results that were available during my care of the patient were reviewed by me and considered in my medical decision making (see chart for details).     Patient presents with symptoms likely from a viral upper respiratory infection. Differential includes bacterial pneumonia, sinusitis, allergic rhinitis, COVID-19, flu. Do not suspect underlying cardiopulmonary process. Symptoms seem unlikely related to ACS, CHF or COPD exacerbations, pneumonia, pneumothorax. Patient is nontoxic appearing and not in need of emergent medical intervention.  COVID test pending.  No suspicion for pneumonia or underlying cardiopulmonary process.  Do not think that chest imaging is necessary at  this time given no adventitious lung sounds and no signs of respiratory compromise.  Although, discussed strict return precautions.  Recommended symptom control with over the counter medications.  Offered Flonase but patient declined.  Will send benzonatate.  Return if symptoms fail to improve. Patient states understanding and is agreeable.  Discharged with PCP followup.  Final Clinical Impressions(s) / UC Diagnoses   Final diagnoses:  Viral upper respiratory tract infection with cough     Discharge Instructions      It appears that you have a viral upper respiratory infection  that should self resolve in the next few days.  You have been prescribed a cough medication to take as needed.  Please follow-up if symptoms persist or worsen.    ED Prescriptions     Medication Sig Dispense Auth. Provider   benzonatate (TESSALON) 100 MG capsule Take 1 capsule (100 mg total) by mouth every 8 (eight) hours as needed for cough. 21 capsule [place], Michele Rockers, [place]      PDMP not reviewed this encounter.   Teodora Medici, [place] 12/29/21 [phone]

## 2021-12-29 NOTE — ED Triage Notes (Addendum)
Cough and nasal congestion starting last week. Pressure/aching radiating into ears

## 2021-12-30 LAB — NOVEL CORONAVIRUS, NAA: SARS-CoV-2, NAA: NOT DETECTED

## 2022-01-23 ENCOUNTER — Telehealth: Payer: Self-pay | Admitting: Internal Medicine

## 2022-01-23 NOTE — Telephone Encounter (Signed)
Left message for patient to call back to schedule Medicare Annual Wellness Visit  ? ?Last AWV  09/27/19 ? ?Please schedule at anytime with [facility] if patient calls the office back.   ? ?40 Minutes appointment  ? ?Any questions, please call me at [phone]  ?

## 2022-01-27 ENCOUNTER — Encounter: Payer: Self-pay | Admitting: Emergency Medicine

## 2022-01-27 ENCOUNTER — Other Ambulatory Visit: Payer: Self-pay

## 2022-01-27 ENCOUNTER — Ambulatory Visit
Admission: EM | Admit: 2022-01-27 | Discharge: 2022-01-27 | Disposition: A | Discharge status: Other Acute Inpatient Hospital | Payer: Medicare Other

## 2022-01-27 DIAGNOSIS — R42 Dizziness and giddiness: Secondary | ICD-10-CM | POA: Diagnosis not present

## 2022-01-27 DIAGNOSIS — R5383 Other fatigue: Secondary | ICD-10-CM | POA: Diagnosis not present

## 2022-01-27 NOTE — ED Provider Notes (Addendum)
?[facility] ? ? ? ?[name]: [id number] ?Arrival date & time: 01/27/22  1314 ? ? ?  ? ?History   ?Chief Complaint ?Chief Complaint  ?Patient presents with  ? Follow-up  ? ? ?HPI ?Emily Bentley is a 87 y.o. female.  ? ?Patient presents for further evaluation for dizziness, feeling off balance, mouth pain, itchy scalp, lethargy.  Patient reports the dizziness started this morning.  She reports that she does have history of dizzy spells but reports that this "feels different".  It has caused issues with walking and she reports that she cannot "focus".  She also reports that she has been sleeping a lot more because she has been very tired.  She had an upper respiratory infection so she is attributing the symptoms to this.  Reports that her entire mouth has been painful as well including the lips and the inner part of the mouth but denies any sore throat.  Patient also reports that she had a lesion removed from her scalp and has been intermittently itchy ever since this occurred.  Denies chest pain, shortness of breath, headache, blurred vision, nausea, vomiting, diarrhea, abdominal pain.  Denies any known fevers.  Denies any ear pain.  Patient does have a cochlear implant. ? ? ? ?Past Medical History:  ?Diagnosis Date  ? Allergy   ? Arthritis   ? Asthma   ? Cataract   ? Chronic allergic rhinitis   ? with asthmatic component  ? Diverticulosis of colon (without mention of hemorrhage)   ? GERD (gastroesophageal reflux disease)   ? Hearing impairment   ? Hiatal hernia   ? History of bursitis   ? right shoulder  ? History of cochlear implant   ? Cant have an MRI  ? History of colon polyps   ? History of pneumonia   ? History of PSVT (paroxysmal supraventricular tachycardia)   ? Hormone replacement therapy (postmenopausal)   ? IBS (irritable bowel syndrome)   ? Peptic stricture of esophagus   ? Watermelon stomach   ? ? ?Patient Active Problem List  ? Diagnosis Date Noted  ? Ear pain 08/29/2021  ? Allergic rhinitis  due to pollen 07/30/2021  ? Neck swelling 12/23/2020  ? Laryngopharyngeal reflux 10/28/2020  ? Vaginal prolapse 10/18/2020  ? Vitamin D deficiency 08/20/2020  ? Tingling 08/20/2020  ? Myalgia 08/20/2020  ? Bone pain 08/20/2020  ? Bilateral thoracic back pain 08/20/2020  ? SBO (small bowel obstruction) ([place]) 05/07/2020  ? Small bowel obstruction ([place]) 05/07/2020  ? Dysphagia 01/04/2020  ? Chronic bilateral thoracic back pain 09/28/2019  ? Scalp cyst 09/28/2019  ? Allergic rhinitis 09/28/2019  ? Constipation 08/25/2018  ? Elevated blood pressure reading in office without diagnosis of hypertension 08/25/2018  ? Anxiety 02/04/2018  ? Fatigue 12/15/2016  ? Degenerative disc disease, lumbar 09/16/2015  ? Routine general medical examination at a health care facility 07/02/2013  ? H/O supraventricular tachycardia 08/25/2012  ? Bilateral sensorineural hearing loss 07/28/2012  ? OAB (overactive bladder) 05/25/2012  ? Osteopenia 10/09/2009  ? Cochlear implant in place 12/30/2007  ? PSVT 12/30/2007  ? GERD (gastroesophageal reflux disease) 12/30/2007  ? ? ?Past Surgical History:  ?Procedure Laterality Date  ? ABDOMINAL HYSTERECTOMY    ? APPENDECTOMY    ? CHOLECYSTECTOMY    ? COCHLEAR IMPLANT Left Oct '14  ? Done at [facility] - great results / Serial No: [id number] / Model: CI422  ? ESOPHAGOGASTRODUODENOSCOPY    ? EYE SURGERY    ?  POLYPECTOMY    ? TUBAL LIGATION    ? urethral dilatations    ? VENTRICULAR ABLATION SURGERY    ? ? ?OB History   ? ? Gravida  ?5  ? Para  ?4  ? Term  ?4  ? Preterm  ?   ? AB  ?1  ? Living  ?4  ?  ? ? SAB  ?1  ? IAB  ?   ? Ectopic  ?   ? Multiple  ?   ? Live Births  ?4  ?   ?  ?  ? ? ? ?Home Medications   ? ?Prior to Admission medications   ?Medication Sig Start Date End Date Taking? Authorizing Provider  ?acetaminophen (TYLENOL) 650 MG CR tablet Take 650 mg by mouth every 8 (eight) hours as needed for pain.    [provider]  ?ALPRAZolam Duanne Moron) 0.5 MG tablet Take 1 tablet (0.5 mg  total) by mouth 3 (three) times daily as needed for anxiety. 01/17/21   [name], MD  ?amoxicillin-clavulanate (AUGMENTIN) 875-125 MG tablet Take 1 tablet by mouth 2 (two) times daily. 08/28/21   [name], MD  ?azelastine (ASTELIN) 0.1 % nasal spray Place 2 sprays into both nostrils 2 (two) times daily. 01/17/21   [name], MD  ?benzonatate (TESSALON) 100 MG capsule Take 1 capsule (100 mg total) by mouth every 8 (eight) hours as needed for cough. 12/29/21   Teodora Medici, FNP  ?esomeprazole (NEXIUM) 20 MG capsule Take 20 mg by mouth daily at 12 noon.    [provider]  ?fluticasone (FLONASE) 50 MCG/ACT nasal spray Place 2 sprays into both nostrils daily. 01/17/21   [name], MD  ?loratadine (CLARITIN) 10 MG tablet Take 10 mg by mouth daily. 07/23/21   [provider]  ?magnesium hydroxide (MILK OF MAGNESIA) 400 MG/5ML suspension Take 30 mLs by mouth daily as needed for mild constipation.    [provider]  ?montelukast (SINGULAIR) 10 MG tablet Take 1 tablet (10 mg total) by mouth at bedtime. 10/16/20   [name], MD  ?multivitamin-iron-minerals-folic acid (CENTRUM) chewable tablet Chew 1 tablet by mouth daily.    [provider]  ?neomycin-polymyxin-hydrocortisone (CORTISPORIN) OTIC solution Place 3 drops into both ears 3 (three) times daily. 09/17/21   [name], MD  ?nystatin (MYCOSTATIN) 100000 UNIT/ML suspension Take 5 mLs (500,000 Units total) by mouth in the morning and at bedtime. 01/17/21   [name], MD  ?Sennosides [facility] EXTRA STRENGTH) 17.2 MG TABS Take 1 tablet (17.2 mg total) by mouth daily. 05/10/20   Little Ishikawa, MD  ?traMADol Veatrice Bourbon) 50 MG tablet TAKE 1/2 TO 1 TABLET BY MOUTH TWICE DAILY 09/17/21   [name], MD  ? ? ?Family History ?Family History  ?Problem Relation Age of Onset  ? Diabetes Mother   ? Uterine cancer Mother   ? Coronary artery disease Father   ?      CABG x 5 in 1985  ? Hypertension Father   ? Cancer Sister   ? Cancer Brother   ? Cancer Maternal Aunt   ? Colon cancer Neg Hx   ? ? ?Social History ?Social History  ? ?Tobacco Use  ? Smoking status: Never  ? Smokeless tobacco: Never  ?Vaping Use  ? Vaping Use: Never used  ?Substance Use Topics  ? Alcohol use: No  ? Drug use: No  ? ? ? ?Allergies   ?Sulfamethoxazole-trimethoprim ? ? ?  Review of Systems ?Review of Systems ?Per HPI ? ?Physical Exam ?Triage Vital Signs ?ED Triage Vitals  ?Enc Vitals Group  ?   BP 01/27/22 1428 130/83  ?   Pulse Rate 01/27/22 1428 81  ?   Resp 01/27/22 1428 18  ?   Temp 01/27/22 1428 98.5 ?F (36.9 ?C)  ?   Temp Source 01/27/22 1428 Oral  ?   SpO2 01/27/22 1428 96 %  ?   Weight --   ?   Height --   ?   Head Circumference --   ?   Peak Flow --   ?   Pain Score 01/27/22 1429 0  ?   Pain Loc --   ?   Pain Edu? --   ?   Excl. in [place]? --   ? ?No data found. ? ?Updated Vital Signs ?BP 130/83 (BP Location: Right Arm)   Pulse 81   Temp 98.5 ?F (36.9 ?C) (Oral)   Resp 18   SpO2 96%  ? ?Visual Acuity ?Right Eye Distance:   ?Left Eye Distance:   ?Bilateral Distance:   ? ?Right Eye Near:   ?Left Eye Near:    ?Bilateral Near:    ? ?Physical Exam ?Constitutional:   ?   General: She is not in acute distress. ?   Appearance: Normal appearance. She is not toxic-appearing or diaphoretic.  ?HENT:  ?   Head: Normocephalic and atraumatic.  ?   Comments: No obvious abnormalities to lips or mouth. ?   Right Ear: Tympanic membrane and ear canal normal.  ?   Left Ear: Tympanic membrane and ear canal normal.  ?   Nose: Congestion present.  ?Eyes:  ?   Extraocular Movements: Extraocular movements intact.  ?   Conjunctiva/sclera: Conjunctivae normal.  ?   Pupils: Pupils are equal, round, and reactive to light.  ?Cardiovascular:  ?   Rate and Rhythm: Normal rate and regular rhythm.  ?   Pulses: Normal pulses.  ?   Heart sounds: Normal heart sounds.  ?Pulmonary:  ?   Effort: Pulmonary effort is normal. No  respiratory distress.  ?   Breath sounds: Normal breath sounds.  ?Skin: ?   General: Skin is warm and dry.  ?   Comments: No obvious abnormalities to scalp.  ?Neurological:  ?   General: No focal deficit present.

## 2022-01-27 NOTE — Discharge Instructions (Signed)
Please go to the emergency department as soon as you leave urgent care for further evaluation and management. ?

## 2022-01-27 NOTE — ED Triage Notes (Signed)
Pt here for follow up after having URI; pt sts she woke this am and had some trouble with balance and some scalp and ear itching  ?

## 2022-01-28 ENCOUNTER — Encounter: Payer: Self-pay | Admitting: Internal Medicine

## 2022-01-28 ENCOUNTER — Ambulatory Visit (INDEPENDENT_AMBULATORY_CARE_PROVIDER_SITE_OTHER): Payer: Medicare Other | Admitting: Internal Medicine

## 2022-01-28 VITALS — BP 120/82 | HR 70 | Resp 18 | Ht 62.0 in | Wt 158.2 lb

## 2022-01-28 DIAGNOSIS — R5383 Other fatigue: Secondary | ICD-10-CM | POA: Diagnosis not present

## 2022-01-28 DIAGNOSIS — H538 Other visual disturbances: Secondary | ICD-10-CM | POA: Diagnosis not present

## 2022-01-28 DIAGNOSIS — E559 Vitamin D deficiency, unspecified: Secondary | ICD-10-CM | POA: Diagnosis not present

## 2022-01-28 DIAGNOSIS — Z9621 Cochlear implant status: Secondary | ICD-10-CM | POA: Diagnosis not present

## 2022-01-28 DIAGNOSIS — R42 Dizziness and giddiness: Secondary | ICD-10-CM | POA: Diagnosis not present

## 2022-01-28 DIAGNOSIS — R002 Palpitations: Secondary | ICD-10-CM | POA: Diagnosis not present

## 2022-01-28 LAB — CBC
HCT: 41.5 % (ref 36.0–46.0)
Hemoglobin: 13.5 g/dL (ref 12.0–15.0)
MCHC: 32.5 g/dL (ref 30.0–36.0)
MCV: 81.3 fl (ref 78.0–100.0)
Platelets: 215 10*3/uL (ref 150.0–400.0)
RBC: 5.11 Mil/uL (ref 3.87–5.11)
RDW: 14.4 % (ref 11.5–15.5)
WBC: 6.6 10*3/uL (ref 4.0–10.5)

## 2022-01-28 LAB — COMPREHENSIVE METABOLIC PANEL
ALT: 13 U/L (ref 0–35)
AST: 28 U/L (ref 0–37)
Albumin: 4 g/dL (ref 3.5–5.2)
Alkaline Phosphatase: 68 U/L (ref 39–117)
BUN: 14 mg/dL (ref 6–23)
CO2: 29 mEq/L (ref 19–32)
Calcium: 9.7 mg/dL (ref 8.4–10.5)
Chloride: 105 mEq/L (ref 96–112)
Creatinine, Ser: 1.06 mg/dL (ref 0.40–1.20)
GFR: 48.32 mL/min — ABNORMAL LOW (ref >60.00)
Glucose, Bld: 88 mg/dL (ref 70–99)
Potassium: 4.2 mEq/L (ref 3.5–5.1)
Sodium: 141 mEq/L (ref 135–145)
Total Bilirubin: 1.1 mg/dL (ref 0.2–1.2)
Total Protein: 7.4 g/dL (ref 6.0–8.3)

## 2022-01-28 LAB — VITAMIN D 25 HYDROXY (VIT D DEFICIENCY, FRACTURES): VITD: 62.01 ng/mL (ref 30.00–100.00)

## 2022-01-28 LAB — VITAMIN B12: Vitamin B-12: 635 pg/mL (ref 211–911)

## 2022-01-28 LAB — TSH: TSH: 4.63 u[IU]/mL (ref 0.35–5.50)

## 2022-01-28 NOTE — Assessment & Plan Note (Addendum)
Checking EKG and CBC and CMP and TSH and vitamin D and B12 today for causes. New 1st degree AV block on EKG not on rate reducing medication. If persistent can get holter monitoring. May be related to feeling poorly.  ?

## 2022-01-28 NOTE — Patient Instructions (Addendum)
We will get the CT scan of the head.  ? ?We are checking the blood work today and the EKG we did looks normal.  ?

## 2022-01-28 NOTE — Progress Notes (Signed)
? ?  Subjective:  ? ?Patient ID: Emily Bentley, female    DOB: 07/19/1935, 87 y.o.   MRN: [id number] ? ?HPI ?The patient is an 87 YO female coming in for palpitations and new blurred vision and dizziness. ? ?Review of Systems  ?Constitutional:  Positive for activity change and fatigue.  ?HENT:  Positive for ear pain.   ?Eyes:  Positive for visual disturbance.  ?Respiratory:  Negative for cough, chest tightness and shortness of breath.   ?Cardiovascular:  Negative for chest pain, palpitations and leg swelling.  ?Gastrointestinal:  Negative for abdominal distention, abdominal pain, constipation, diarrhea, nausea and vomiting.  ?Musculoskeletal:  Positive for back pain and neck pain.  ?Skin: Negative.   ?Neurological:  Positive for dizziness and weakness. Negative for facial asymmetry, light-headedness, numbness and headaches.  ?Psychiatric/Behavioral: Negative.    ? ?Objective:  ?Physical Exam ?Constitutional:   ?   Appearance: She is well-developed.  ?HENT:  ?   Head: Normocephalic and atraumatic.  ?   Comments: Has cochlear implant ?Eyes:  ?   Extraocular Movements: Extraocular movements intact.  ?   Pupils: Pupils are equal, round, and reactive to light.  ?Cardiovascular:  ?   Rate and Rhythm: Normal rate and regular rhythm.  ?Pulmonary:  ?   Effort: Pulmonary effort is normal. No respiratory distress.  ?   Breath sounds: Normal breath sounds. No wheezing or rales.  ?Abdominal:  ?   General: Bowel sounds are normal. There is no distension.  ?   Palpations: Abdomen is soft.  ?   Tenderness: There is no abdominal tenderness. There is no rebound.  ?Musculoskeletal:  ?   Cervical back: Normal range of motion.  ?Skin: ?   General: Skin is warm and dry.  ?Neurological:  ?   Mental Status: She is alert and oriented to person, place, and time.  ?   Cranial Nerves: No cranial nerve deficit.  ?   Motor: No weakness.  ?   Coordination: Coordination normal.  ? ? ?Vitals:  ? 01/28/22 0924  ?BP: 120/82  ?Pulse: 70  ?Resp: 18   ?SpO2: 98%  ?Weight: 158 lb 3.2 oz (71.8 kg)  ?Height: '5\' 2"'$  (1.575 m)  ? ?EKG: Rate 63, axis normal, interval 1st degree block, sinus, no st or t wave changes, 1st degree block is new since EKG 2014 ? ?This visit occurred during the SARS-CoV-2 public health emergency.  Safety protocols were in place, including screening questions prior to the visit, additional usage of staff PPE, and extensive cleaning of exam room while observing appropriate contact time as indicated for disinfecting solutions.  ? ?Assessment & Plan:  ? ?

## 2022-01-28 NOTE — Assessment & Plan Note (Signed)
Yesterday and lasted several hours. Could be a TIA. Needs head CT stat. She did go to urgent care but did not follow through on stroke workup at ER. Given cochlear implant could be related to that.  ?

## 2022-01-28 NOTE — Assessment & Plan Note (Signed)
Unclear etiology but given her cochlear implant she need CT head, she is also having pain with her cochlear implant lately. Ordered head CT stat.  ?

## 2022-01-30 ENCOUNTER — Other Ambulatory Visit: Payer: Self-pay | Admitting: Internal Medicine

## 2022-01-30 ENCOUNTER — Ambulatory Visit
Admission: RE | Admit: 2022-01-30 | Discharge: 2022-01-30 | Disposition: A | Discharge status: Home / Self Care | Payer: Medicare Other | Source: Ambulatory Visit | Attending: Internal Medicine | Admitting: Internal Medicine

## 2022-01-30 DIAGNOSIS — R29818 Other symptoms and signs involving the nervous system: Secondary | ICD-10-CM | POA: Diagnosis not present

## 2022-01-30 DIAGNOSIS — H538 Other visual disturbances: Secondary | ICD-10-CM

## 2022-01-30 MED ORDER — AMOXICILLIN-POT CLAVULANATE 875-125 MG PO TABS: 1.0000 | ORAL_TABLET | Freq: Two times a day (BID) | ORAL | 0 refills | Status: AC

## 2022-01-30 NOTE — Assessment & Plan Note (Signed)
Due to new change in vision and vertigo needs CT head. Her implant has not been helping hearing as well lately and need to rule out infection. Ordered head CT stat and treat as appropriate.  ?

## 2022-01-30 NOTE — Assessment & Plan Note (Signed)
Checking CBC, CMP, B12, TSH, vitamin D for cause of fatigue. Treat as appropriate.  ?

## 2022-02-25 DIAGNOSIS — H538 Other visual disturbances: Secondary | ICD-10-CM | POA: Diagnosis not present

## 2022-02-25 DIAGNOSIS — H43813 Vitreous degeneration, bilateral: Secondary | ICD-10-CM | POA: Diagnosis not present

## 2022-02-25 DIAGNOSIS — H353131 Nonexudative age-related macular degeneration, bilateral, early dry stage: Secondary | ICD-10-CM | POA: Diagnosis not present

## 2022-03-20 ENCOUNTER — Other Ambulatory Visit: Payer: Self-pay | Admitting: Internal Medicine

## 2022-04-01 DIAGNOSIS — H353131 Nonexudative age-related macular degeneration, bilateral, early dry stage: Secondary | ICD-10-CM | POA: Diagnosis not present

## 2022-04-01 DIAGNOSIS — H43813 Vitreous degeneration, bilateral: Secondary | ICD-10-CM | POA: Diagnosis not present

## 2022-04-10 ENCOUNTER — Ambulatory Visit (HOSPITAL_COMMUNITY)
Admission: EM | Admit: 2022-04-10 | Discharge: 2022-04-10 | Disposition: A | Discharge status: Home / Self Care | Payer: Medicare Other | Attending: Physician Assistant | Admitting: Physician Assistant

## 2022-04-10 ENCOUNTER — Ambulatory Visit (INDEPENDENT_AMBULATORY_CARE_PROVIDER_SITE_OTHER): Payer: Medicare Other

## 2022-04-10 ENCOUNTER — Encounter (HOSPITAL_COMMUNITY): Payer: Self-pay | Admitting: *Deleted

## 2022-04-10 ENCOUNTER — Other Ambulatory Visit: Payer: Self-pay

## 2022-04-10 DIAGNOSIS — M79672 Pain in left foot: Secondary | ICD-10-CM

## 2022-04-10 DIAGNOSIS — M25572 Pain in left ankle and joints of left foot: Secondary | ICD-10-CM | POA: Diagnosis not present

## 2022-04-10 DIAGNOSIS — M7989 Other specified soft tissue disorders: Secondary | ICD-10-CM | POA: Diagnosis not present

## 2022-04-10 NOTE — ED Provider Notes (Signed)
[facility]    [name]: [id number] Arrival date & time: 04/10/22  1832      History   Chief Complaint Chief Complaint  Patient presents with   Loss of Consciousness   Fall   Foot Injury    HPI Emily Bentley is a 87 y.o. female.   Patient presents today with a several hour history of left lateral foot/ankle pain.  Reports that she was sitting in her house when someone ran the [place] and she got up and tripped over her own feet as she was going to answer the door.  She did not hit her head and denies any loss of consciousness.  Nursing note mentions a syncopal episode but on further discussion with patient she is adamant that she did not lose consciousness or hit her head.  Her primary concern today is left ankle and foot pain.  Pain is rated 8 on a 0-10 pain scale, localized to lateral ankle/foot, described as sharp, worse with ambulation, no alleviating factors identified.  She has tried elevation, ice, compression without improvement of symptoms.  She denies any weakness, numbness, paresthesias.  She is ambulating unassisted.   Past Medical History:  Diagnosis Date   Allergy    Arthritis    Asthma    Cataract    Chronic allergic rhinitis    with asthmatic component   Diverticulosis of colon (without mention of hemorrhage)    GERD (gastroesophageal reflux disease)    Hearing impairment    Hiatal hernia    History of bursitis    right shoulder   History of cochlear implant    Cant have an MRI   History of colon polyps    History of pneumonia    History of PSVT (paroxysmal supraventricular tachycardia)    Hormone replacement therapy (postmenopausal)    IBS (irritable bowel syndrome)    Peptic stricture of esophagus    Watermelon stomach     Patient Active Problem List   Diagnosis Date Noted   Blurred vision 01/28/2022   Vertigo 01/28/2022   Palpitations 01/28/2022   Ear pain 08/29/2021   Allergic rhinitis due to pollen 07/30/2021   Neck swelling  12/23/2020   Laryngopharyngeal reflux 10/28/2020   Vaginal prolapse 10/18/2020   Vitamin D deficiency 08/20/2020   Tingling 08/20/2020   Myalgia 08/20/2020   Bone pain 08/20/2020   Bilateral thoracic back pain 08/20/2020   SBO (small bowel obstruction) ([place]) 05/07/2020   Small bowel obstruction ([place]) 05/07/2020   Dysphagia 01/04/2020   Chronic bilateral thoracic back pain 09/28/2019   Scalp cyst 09/28/2019   Allergic rhinitis 09/28/2019   Constipation 08/25/2018   Elevated blood pressure reading in office without diagnosis of hypertension 08/25/2018   Anxiety 02/04/2018   Fatigue 12/15/2016   Degenerative disc disease, lumbar 09/16/2015   Routine general medical examination at a health care facility 07/02/2013   H/O supraventricular tachycardia 08/25/2012   Bilateral sensorineural hearing loss 07/28/2012   OAB (overactive bladder) 05/25/2012   Osteopenia 10/09/2009   Cochlear implant in place 12/30/2007   PSVT 12/30/2007   GERD (gastroesophageal reflux disease) 12/30/2007    Past Surgical History:  Procedure Laterality Date   ABDOMINAL HYSTERECTOMY     APPENDECTOMY     CHOLECYSTECTOMY     COCHLEAR IMPLANT Left Oct '14   Done at [facility] - great results / Serial No: [id number] / Model: CI422   ESOPHAGOGASTRODUODENOSCOPY     EYE SURGERY     POLYPECTOMY  TUBAL LIGATION     urethral dilatations     VENTRICULAR ABLATION SURGERY      OB History     Gravida  5   Para  4   Term  4   Preterm      AB  1   Living  4      SAB  1   IAB      Ectopic      Multiple      Live Births  4            Home Medications    Prior to Admission medications   Medication Sig Start Date End Date Taking? Authorizing Provider  acetaminophen (TYLENOL) 650 MG CR tablet Take 650 mg by mouth every 8 (eight) hours as needed for pain.    [provider]  azelastine (ASTELIN) 0.1 % nasal spray Place 2 sprays into both nostrils 2 (two) times daily.  01/17/21   [name], MD  benzonatate (TESSALON) 100 MG capsule Take 1 capsule (100 mg total) by mouth every 8 (eight) hours as needed for cough. 12/29/21   Teodora Medici, FNP  esomeprazole (NEXIUM) 20 MG capsule Take 20 mg by mouth daily at 12 noon.    [provider]  fluticasone (FLONASE) 50 MCG/ACT nasal spray Place 2 sprays into both nostrils daily. 01/17/21   [name], MD  loratadine (CLARITIN) 10 MG tablet Take 10 mg by mouth daily. 07/23/21   [provider]  magnesium hydroxide (MILK OF MAGNESIA) 400 MG/5ML suspension Take 30 mLs by mouth daily as needed for mild constipation.    [provider]  montelukast (SINGULAIR) 10 MG tablet Take 1 tablet (10 mg total) by mouth at bedtime. 10/16/20   [name], MD  multivitamin-iron-minerals-folic acid (CENTRUM) chewable tablet Chew 1 tablet by mouth daily.    [provider]  neomycin-polymyxin-hydrocortisone (CORTISPORIN) OTIC solution Place 3 drops into both ears 3 (three) times daily. 09/17/21   [name], MD  nystatin (MYCOSTATIN) 100000 UNIT/ML suspension Take 5 mLs (500,000 Units total) by mouth in the morning and at bedtime. 01/17/21   [name], MD  Sennosides (SENOKOT EXTRA STRENGTH) 17.2 MG TABS Take 1 tablet (17.2 mg total) by mouth daily. 05/10/20   Little Ishikawa, MD  traMADol Veatrice Bourbon) 50 MG tablet TAKE 1/2 TO 1 TABLET BY MOUTH TWICE DAILY 03/23/22   [name], MD    Family History Family History  Problem Relation Age of Onset   Diabetes Mother    Uterine cancer Mother    Coronary artery disease Father        CABG x 5 in 1985   Hypertension Father    Cancer Sister    Cancer Brother    Cancer Maternal Aunt    Colon cancer Neg Hx     Social History Social History   Tobacco Use   Smoking status: Never   Smokeless tobacco: Never  Vaping Use   Vaping Use: Never used  Substance Use Topics   Alcohol use: No   Drug  use: No     Allergies   Sulfamethoxazole-trimethoprim   Review of Systems Review of Systems  Constitutional:  Positive for activity change. Negative for appetite change, fatigue and fever.  Gastrointestinal:  Negative for abdominal pain, diarrhea, nausea and vomiting.  Musculoskeletal:  Positive for arthralgias. Negative for myalgias.  Skin:  Negative for color change and wound.  Neurological:  Negative for dizziness,  syncope, weakness, light-headedness, numbness and headaches.    Physical Exam Triage Vital Signs ED Triage Vitals  Enc Vitals Group     BP 04/10/22 1857 (!) 146/98     Pulse Rate 04/10/22 1857 77     Resp 04/10/22 1857 18     Temp 04/10/22 1857 97.6 F (36.4 C)     Temp src --      SpO2 04/10/22 1857 100 %     Weight --      Height --      Head Circumference --      Peak Flow --      Pain Score 04/10/22 1853 8     Pain Loc --      Pain Edu? --      Excl. in [place]? --    No data found.  Updated Vital Signs BP (!) 146/98   Pulse 77   Temp 97.6 F (36.4 C)   Resp 18   SpO2 100%   Visual Acuity Right Eye Distance:   Left Eye Distance:   Bilateral Distance:    Right Eye Near:   Left Eye Near:    Bilateral Near:     Physical Exam Vitals reviewed.  Constitutional:      General: She is awake. She is not in acute distress.    Appearance: Normal appearance. She is well-developed. She is not ill-appearing.     Comments: Very pleasant female appears stated age in no acute distress  HENT:     Head: Normocephalic and atraumatic.  Cardiovascular:     Rate and Rhythm: Normal rate and regular rhythm.     Pulses:          Posterior tibial pulses are 1+ on the right side and 1+ on the left side.     Heart sounds: Normal heart sounds, S1 normal and S2 normal. No murmur heard. Pulmonary:     Effort: Pulmonary effort is normal.     Breath sounds: Normal breath sounds. No wheezing, rhonchi or rales.  Abdominal:     Palpations: Abdomen is soft.      Tenderness: There is no abdominal tenderness.  Musculoskeletal:     Left ankle: Swelling present. Tenderness present over the lateral malleolus. No medial malleolus tenderness. Decreased range of motion.     Left foot: Normal range of motion. Tenderness and bony tenderness present.     Comments: Left ankle/foot: Tenderness palpation that lateral malleolus and along fifth metatarsal.  No deformity noted.  Foot neurovascularly intact.  Psychiatric:        Behavior: Behavior is cooperative.     UC Treatments / Results  Labs (all labs ordered are listed, but only abnormal results are displayed) Labs Reviewed - No data to display  EKG   Radiology DG Ankle Complete Left  Result Date: 04/10/2022 CLINICAL DATA:  Pain and swelling after fall EXAM: LEFT ANKLE COMPLETE - 3+ VIEW COMPARISON:  None Available. FINDINGS: No acute fracture or malalignment. Moderate plantar calcaneal spur. Vascular calcifications. IMPRESSION: No acute osseous abnormality. Electronically Signed   By: Donavan Foil M.D.   On: 04/10/2022 19:30   DG Foot Complete Left  Result Date: 04/10/2022 CLINICAL DATA:  Pain and swelling after fall EXAM: LEFT FOOT - COMPLETE 3+ VIEW COMPARISON:  None Available. FINDINGS: No acute fracture or malalignment. Mild degenerative changes at the first MTP joint. Soft tissues are unremarkable IMPRESSION: No acute osseous abnormality. Electronically Signed   By: Madie Reno.D.  On: 04/10/2022 19:32    Procedures Procedures (including critical care time)  Medications Ordered in UC Medications - No data to display  Initial Impression / Assessment and Plan / UC Course  I have reviewed the triage vital signs and the nursing notes.  Pertinent labs & imaging results that were available during my care of the patient were reviewed by me and considered in my medical decision making (see chart for details).     Discussed with patient that if there is any concern that she had loss of  consciousness or head injury she must go to the emergency room.  Patient reported several times that she did not have any loss of consciousness or head injury and declined ER evaluation.  X-ray obtained of ankle and foot which showed no osseous abnormalities.  Discussed sprain as etiology of symptoms.  Recommended RICE protocol.  She can use Tylenol.  She does have tramadol available from her PCP and can use this for pain relief.  She was sent a brace for support and pain relief.  Discussed that if her symptoms are not improving she is to follow-up with orthopedics and was given contact information for local provider with instruction to call to schedule an appointment.  Strict return precautions given to which she expressed understanding.  Final Clinical Impressions(s) / UC Diagnoses   Final diagnoses:  Acute left ankle pain  Left foot pain     Discharge Instructions      Your x-rays were normal.  Keep your foot elevated and use ice.  Use the brace for comfort and support.  Use your tramadol prescribed by your PCP for pain.  If your symptoms are not improving please follow-up with orthopedics; call to schedule an appointment.  If anything worsens please return for reevaluation.  As we discussed, if you have any concern that he passed out, have loss of consciousness, hit your head you must go to the emergency room immediately.  Follow-up with your PCP first thing next week.     ED Prescriptions   None    I have reviewed the PDMP during this encounter.   Terrilee Croak, PA-C 04/10/22 1945

## 2022-04-10 NOTE — ED Triage Notes (Signed)
Pt reports the door bell went off and after getting up to answer door she woke up on the floor. Pt now has Lt foot pain. Pt reports she did not hit her head. Pt ambulatory with cane to room.

## 2022-04-10 NOTE — Discharge Instructions (Addendum)
Your x-rays were normal.  Keep your foot elevated and use ice.  Use the brace for comfort and support.  Use your tramadol prescribed by your PCP for pain.  If your symptoms are not improving please follow-up with orthopedics; call to schedule an appointment.  If anything worsens please return for reevaluation.  As we discussed, if you have any concern that he passed out, have loss of consciousness, hit your head you must go to the emergency room immediately.  Follow-up with your PCP first thing next week.

## 2022-04-21 ENCOUNTER — Encounter: Payer: Self-pay | Admitting: Internal Medicine

## 2022-04-21 ENCOUNTER — Ambulatory Visit (INDEPENDENT_AMBULATORY_CARE_PROVIDER_SITE_OTHER): Payer: Medicare Other | Admitting: Internal Medicine

## 2022-04-21 VITALS — BP 128/80 | HR 76 | Resp 18 | Ht 62.0 in | Wt 154.6 lb

## 2022-04-21 DIAGNOSIS — R0789 Other chest pain: Secondary | ICD-10-CM

## 2022-04-21 DIAGNOSIS — F419 Anxiety disorder, unspecified: Secondary | ICD-10-CM | POA: Diagnosis not present

## 2022-04-21 MED ORDER — ESCITALOPRAM OXALATE 10 MG PO TABS: 10.0000 mg | ORAL_TABLET | Freq: Every day | ORAL | 1 refills | Status: DC

## 2022-04-21 NOTE — Assessment & Plan Note (Signed)
We have done labs and EKG. Will refer to cardiology for consideration of stress testing etc to rule out cardiac etiology. She has past SVT and is having new anxiety and chest pressure but states this is not like her SVT in the past.

## 2022-04-21 NOTE — Assessment & Plan Note (Signed)
She weaned herself off xanax. Starting therapy soon. We will start lexapro 10 mg daily and assess in 3-4 weeks efficacy. She will let us know and sooner if problems.

## 2022-04-21 NOTE — Progress Notes (Signed)
   Subjective:   Patient ID: Emily Bentley, female    DOB: 08-31-1935, 87 y.o.   MRN: [id number]  Anxiety Symptoms include chest pain and nervous/anxious behavior. Patient reports no nausea, palpitations or shortness of breath.     The patient is an 87 YO female coming in for follow up with worsening anxiety and chest pains lasting 30-60 minutes resolve with rest or tramadol in the side and into the neck area. Prior SVT s/p ablation but does not feel similar.   Review of Systems  Constitutional: Negative.   HENT: Negative.    Eyes: Negative.   Respiratory:  Positive for chest tightness. Negative for cough and shortness of breath.   Cardiovascular:  Positive for chest pain. Negative for palpitations and leg swelling.  Gastrointestinal:  Negative for abdominal distention, abdominal pain, constipation, diarrhea, nausea and vomiting.  Musculoskeletal: Negative.   Skin: Negative.   Neurological: Negative.   Psychiatric/Behavioral:  The patient is nervous/anxious.     Objective:  Physical Exam Constitutional:      Appearance: She is well-developed.  HENT:     Head: Normocephalic and atraumatic.  Cardiovascular:     Rate and Rhythm: Normal rate and regular rhythm.  Pulmonary:     Effort: Pulmonary effort is normal. No respiratory distress.     Breath sounds: Normal breath sounds. No wheezing or rales.  Abdominal:     General: Bowel sounds are normal. There is no distension.     Palpations: Abdomen is soft.     Tenderness: There is no abdominal tenderness. There is no rebound.  Musculoskeletal:     Cervical back: Normal range of motion.  Skin:    General: Skin is warm and dry.  Neurological:     Mental Status: She is alert and oriented to person, place, and time.     Coordination: Coordination normal.     Vitals:   04/21/22 0951  BP: 128/80  Pulse: 76  Resp: 18  SpO2: 98%  Weight: 154 lb 9.6 oz (70.1 kg)  Height: '5\' 2"'$  (1.575 m)    Assessment & Plan:  Visit time  25 minutes in face to face communication with patient and coordination of care, additional 15 minutes spent in record review, coordination or care, ordering tests, communicating/referring to other healthcare professionals, documenting in medical records all on the same day of the visit for total time 40 minutes spent on the visit.

## 2022-04-21 NOTE — Patient Instructions (Signed)
We will start lexapro to take 1 pill daily. This should work in 2-3 weeks to help.  We will get you in the cardiologist.

## 2022-04-22 ENCOUNTER — Encounter (HOSPITAL_COMMUNITY): Payer: Self-pay | Admitting: *Deleted

## 2022-04-22 ENCOUNTER — Ambulatory Visit (HOSPITAL_COMMUNITY)
Admission: EM | Admit: 2022-04-22 | Discharge: 2022-04-22 | Disposition: A | Discharge status: Home / Self Care | Payer: Medicare Other | Attending: Emergency Medicine | Admitting: Emergency Medicine

## 2022-04-22 DIAGNOSIS — F411 Generalized anxiety disorder: Secondary | ICD-10-CM | POA: Diagnosis not present

## 2022-04-22 DIAGNOSIS — K5900 Constipation, unspecified: Secondary | ICD-10-CM | POA: Diagnosis not present

## 2022-04-22 DIAGNOSIS — K5904 Chronic idiopathic constipation: Secondary | ICD-10-CM | POA: Insufficient documentation

## 2022-04-22 DIAGNOSIS — R350 Frequency of micturition: Secondary | ICD-10-CM | POA: Insufficient documentation

## 2022-04-22 LAB — POCT URINALYSIS DIPSTICK, ED / UC
Bilirubin Urine: NEGATIVE
Glucose, UA: NEGATIVE mg/dL
Ketones, ur: NEGATIVE mg/dL
Leukocytes,Ua: NEGATIVE
Nitrite: NEGATIVE
Protein, ur: NEGATIVE mg/dL
Specific Gravity, Urine: 1.01 (ref 1.005–1.030)
Urobilinogen, UA: 0.2 mg/dL (ref 0.0–1.0)
pH: 7 (ref 5.0–8.0)

## 2022-04-22 MED ORDER — LANSOPRAZOLE 30 MG PO CPDR
30.0000 mg | DELAYED_RELEASE_CAPSULE | Freq: Every day | ORAL | 1 refills | Status: DC
Start: 2022-04-22 — End: 2023-03-31

## 2022-04-22 MED ORDER — LINACLOTIDE 145 MCG PO CAPS: 145.0000 ug | ORAL_CAPSULE | Freq: Every day | ORAL | 1 refills | Status: DC

## 2022-04-22 NOTE — Discharge Instructions (Addendum)
Please continue taking Lexapro once daily at roughly the same time every day but I recommend that for the first few weeks of taking this medication that you take 1/2 tablet and not a whole tablet.  After a few weeks, when you feel that you are tolerating this medication well and starting to feel little lighter in mood and less anxious, please consider increasing to taking a full tablet daily.  I have sent a prescription for Linzess to your pharmacy.  I know your insurance does not cover it but if you will reach out to the company that makes [employer], they have support for Medicare patients to help them get this medication.  I provided you with a print out from their website.  Their phone number is [phone].  Their website is also at the bottom of the page that I printed for you.  I sent a prescription for Prevacid to your pharmacy to treat your heartburn.  Please discontinue all other heartburn medications.  Please take 1 capsule daily with or without food.  It looks like your insurance also will not pay for this medication however I provided you with a good Rx coupon that you will use instead of insurance.  Please presented to the pharmacy when you go to pick up your medication.  If they tell you a prior authorization is required, please let them know that you do not want them to follow-up through your insurance that you will just be using the good Rx coupon.

## 2022-04-22 NOTE — ED Triage Notes (Signed)
Pt reports anxiety causes constipation and Pt used an enema with some relief today. Yesterday Pt went to PCP and was started on lexapro and now her anxiety is worse.

## 2022-04-22 NOTE — ED Provider Notes (Signed)
[facility]    [name]: [id number] Arrival date & time: 04/22/22  1637    HISTORY   Chief Complaint  Patient presents with   Anxiety   HPI Emily Bentley is a 87 y.o. female. Patient presents urgent care today with her granddaughter.  Patient states she is suffering from increased anxiety recently worsened by death in the family.  Patient states anxiety usually causes her to become extremely constipated.  Patient states she gave herself an enema yesterday with some relief of her constipation but still feels very constipated.  Patient states she was seen yesterday by her PCP and provided with a prescription for Lexapro, states she took 1 tablet (10 mg) and now feels more anxious than before.  When asked, patient states she was not educated about how Lexapro works other than it may take 8 weeks for her to feel better.  Patient states today that she does not feel any better.  Patient states she has been having a lot of heartburn recently as well, she used to take an over-the-counter medication that was 20 g but stopped taking it when it was not working anymore.  Patient states he has been given a prescription for the higher dose but her insurance will pay for it  The history is provided by the patient.   Past Medical History:  Diagnosis Date   Allergy    Arthritis    Asthma    Cataract    Chronic allergic rhinitis    with asthmatic component   Diverticulosis of colon (without mention of hemorrhage)    GERD (gastroesophageal reflux disease)    Hearing impairment    Hiatal hernia    History of bursitis    right shoulder   History of cochlear implant    Cant have an MRI   History of colon polyps    History of pneumonia    History of PSVT (paroxysmal supraventricular tachycardia)    Hormone replacement therapy (postmenopausal)    IBS (irritable bowel syndrome)    Peptic stricture of esophagus    Watermelon stomach    Patient Active Problem List   Diagnosis Date Noted    Atypical chest pain 04/21/2022   Blurred vision 01/28/2022   Vertigo 01/28/2022   Palpitations 01/28/2022   Ear pain 08/29/2021   Allergic rhinitis due to pollen 07/30/2021   Neck swelling 12/23/2020   Laryngopharyngeal reflux 10/28/2020   Vaginal prolapse 10/18/2020   Vitamin D deficiency 08/20/2020   Tingling 08/20/2020   Myalgia 08/20/2020   Bone pain 08/20/2020   Bilateral thoracic back pain 08/20/2020   Dysphagia 01/04/2020   Chronic bilateral thoracic back pain 09/28/2019   Scalp cyst 09/28/2019   Allergic rhinitis 09/28/2019   Constipation 08/25/2018   Elevated blood pressure reading in office without diagnosis of hypertension 08/25/2018   Anxiety 02/04/2018   Fatigue 12/15/2016   Degenerative disc disease, lumbar 09/16/2015   Routine general medical examination at a health care facility 07/02/2013   H/O supraventricular tachycardia 08/25/2012   Bilateral sensorineural hearing loss 07/28/2012   OAB (overactive bladder) 05/25/2012   Osteopenia 10/09/2009   Cochlear implant in place 12/30/2007   GERD (gastroesophageal reflux disease) 12/30/2007   Past Surgical History:  Procedure Laterality Date   ABDOMINAL HYSTERECTOMY     APPENDECTOMY     CHOLECYSTECTOMY     COCHLEAR IMPLANT Left Oct '14   Done at [facility] - great results / Serial No: [id number] / Model: [id number]   ESOPHAGOGASTRODUODENOSCOPY  EYE SURGERY     POLYPECTOMY     TUBAL LIGATION     urethral dilatations     VENTRICULAR ABLATION SURGERY     OB History     Gravida  5   Para  4   Term  4   Preterm      AB  1   Living  4      SAB  1   IAB      Ectopic      Multiple      Live Births  4          Home Medications    Prior to Admission medications   Medication Sig Start Date End Date Taking? Authorizing Provider  acetaminophen (TYLENOL) 650 MG CR tablet Take 650 mg by mouth every 8 (eight) hours as needed for pain.    [provider]  azelastine (ASTELIN) 0.1  % nasal spray Place 2 sprays into both nostrils 2 (two) times daily. 01/17/21   [name], MD  escitalopram (LEXAPRO) 10 MG tablet Take 1 tablet (10 mg total) by mouth daily. 04/21/22   [name], MD  esomeprazole (NEXIUM) 20 MG capsule Take 20 mg by mouth daily at 12 noon.    [provider]  fluticasone (FLONASE) 50 MCG/ACT nasal spray Place 2 sprays into both nostrils daily. 01/17/21   [name], MD  loratadine (CLARITIN) 10 MG tablet Take 10 mg by mouth daily. 07/23/21   [provider]  magnesium hydroxide (MILK OF MAGNESIA) 400 MG/5ML suspension Take 30 mLs by mouth daily as needed for mild constipation.    [provider]  montelukast (SINGULAIR) 10 MG tablet Take 1 tablet (10 mg total) by mouth at bedtime. 10/16/20   [name], MD  multivitamin-iron-minerals-folic acid (CENTRUM) chewable tablet Chew 1 tablet by mouth daily.    [provider]  Sennosides (SENOKOT EXTRA STRENGTH) 17.2 MG TABS Take 1 tablet (17.2 mg total) by mouth daily. 05/10/20   Little Ishikawa, MD  traMADol Veatrice Bourbon) 50 MG tablet TAKE 1/2 TO 1 TABLET BY MOUTH TWICE DAILY 03/23/22   [name], MD    Family History Family History  Problem Relation Age of Onset   Diabetes Mother    Uterine cancer Mother    Coronary artery disease Father        CABG x 5 in 1985   Hypertension Father    Cancer Sister    Cancer Brother    Cancer Maternal Aunt    Colon cancer Neg Hx    Social History Social History   Tobacco Use   Smoking status: Never   Smokeless tobacco: Never  Vaping Use   Vaping Use: Never used  Substance Use Topics   Alcohol use: No   Drug use: No   Allergies   Sulfamethoxazole-trimethoprim  Review of Systems Review of Systems Pertinent findings noted in history of present illness.   Physical Exam Triage Vital Signs ED Triage Vitals  Enc Vitals Group     BP 09/05/21 0827 (!) 147/82     Pulse Rate  09/05/21 0827 72     Resp 09/05/21 0827 18     Temp 09/05/21 0827 98.3 F (36.8 C)     Temp Source 09/05/21 0827 Oral     SpO2 09/05/21 0827 98 %     Weight --      Height --      Head Circumference --  Peak Flow --      Pain Score 09/05/21 0826 5     Pain Loc --      Pain Edu? --      Excl. in [place]? --   No data found.  Updated Vital Signs BP 98/69   Pulse 78   Temp 98.2 F (36.8 C) (Oral)   Resp 20   SpO2 97%   Physical Exam Vitals and nursing note reviewed.  Constitutional:      General: She is not in acute distress.    Appearance: Normal appearance. She is not ill-appearing.  HENT:     Head: Normocephalic and atraumatic.  Eyes:     General: Lids are normal.        Right eye: No discharge.        Left eye: No discharge.     Extraocular Movements: Extraocular movements intact.     Conjunctiva/sclera: Conjunctivae normal.     Right eye: Right conjunctiva is not injected.     Left eye: Left conjunctiva is not injected.  Neck:     Trachea: Trachea and phonation normal.  Cardiovascular:     Rate and Rhythm: Normal rate and regular rhythm.     Pulses: Normal pulses.     Heart sounds: Normal heart sounds. No murmur heard.    No friction rub. No gallop.  Pulmonary:     Effort: Pulmonary effort is normal. No accessory muscle usage, prolonged expiration or respiratory distress.     Breath sounds: Normal breath sounds. No stridor, decreased air movement or transmitted upper airway sounds. No decreased breath sounds, wheezing, rhonchi or rales.  Chest:     Chest wall: No tenderness.  Abdominal:     General: Abdomen is flat. Bowel sounds are normal. There is no distension.     Palpations: Abdomen is soft.     Tenderness: There is generalized abdominal tenderness. There is no right CVA tenderness or left CVA tenderness.     Hernia: No hernia is present.  Musculoskeletal:        General: Normal range of motion.     Cervical back: Normal range of motion and neck supple.  Normal range of motion.  Lymphadenopathy:     Cervical: No cervical adenopathy.  Skin:    General: Skin is warm and dry.     Findings: No erythema or rash.  Neurological:     General: No focal deficit present.     Mental Status: She is alert and oriented to person, place, and time.  Psychiatric:        Attention and Perception: Attention and perception normal. She is attentive. She does not perceive auditory or visual hallucinations.        Mood and Affect: Affect normal. Mood is anxious. Mood is not depressed or elated. Affect is not labile, blunt, flat, angry, tearful or inappropriate.        Speech: Speech normal. She is communicative. Speech is not rapid and pressured, delayed, slurred or tangential.        Behavior: Behavior normal. Behavior is not agitated, slowed, aggressive, withdrawn or hyperactive. Behavior is cooperative.        Thought Content: Thought content is not paranoid or delusional. Thought content does not include homicidal or suicidal ideation. Thought content does not include homicidal or suicidal plan.        Cognition and Memory: Cognition and memory normal. Cognition is not impaired. Memory is not impaired. She does not exhibit impaired recent memory  or impaired remote memory.        Judgment: Judgment normal. Judgment is not impulsive or inappropriate.     Visual Acuity Right Eye Distance:   Left Eye Distance:   Bilateral Distance:    Right Eye Near:   Left Eye Near:    Bilateral Near:     UC Couse / Diagnostics / Procedures:    EKG  Radiology No results found.  Procedures Procedures (including critical care time)  UC Diagnoses / Final Clinical Impressions(s)   I have reviewed the triage vital signs and the nursing notes.  Pertinent labs & imaging results that were available during my care of the patient were reviewed by me and considered in my medical decision making (see chart for details).    Final diagnoses:  GAD (generalized anxiety  disorder)  Chronic idiopathic constipation  Constipation, unspecified constipation type  Increased frequency of urination   Patient educated in lay terms regarding the mechanism of action of Lexapro and advised that I recommend she break her tablets in half so that she is only taking 5 mg daily for the first few weeks.  Patient also advised of common side effects including dry mouth, diarrhea, constipation, dizziness.  Patient states that she has asked for prescriptions of Linzess in the past, patient states that her insurance will pay for it.  Patient provided with a prescription today along with the patient assistance line for the company that makes Linzess that she can reach out to, apparently they provide financial assistance to Medicare patients.  I also provided with a prescription for Prevacid since she stopped taking over-the-counter omeprazole and is complaining of uncontrolled GERD symptoms at this time as well, it is likely that her constipation is making her GERD worse.  Patient provided with a good Rx coupon for this as it is likely her Medicare will not pay for it.  Return precautions advised.  ED Prescriptions     Medication Sig Dispense Auth. Provider   linaclotide (LINZESS) 145 MCG CAPS capsule Take 1 capsule (145 mcg total) by mouth daily before breakfast. 90 capsule Lynden Oxford Scales, PA-C   lansoprazole (PREVACID) 30 MG capsule Take 1 capsule (30 mg total) by mouth daily at 12 noon. 90 capsule Lynden Oxford Scales, [place]      PDMP not reviewed this encounter.  Pending results:  Labs Reviewed  POCT URINALYSIS DIPSTICK, ED / UC - Abnormal; Notable for the following components:      Result Value   Hgb urine dipstick SMALL (*)    All other components within normal limits  URINE CULTURE    Medications Ordered in UC: Medications - No data to display  Disposition Upon Discharge:  Condition: stable for discharge home Home: take medications as prescribed; routine  discharge instructions as discussed; follow up as advised.  Patient presented with an acute illness with associated systemic symptoms and significant discomfort requiring urgent management. In my opinion, this is a condition that a prudent lay person (someone who possesses an average knowledge of health and medicine) may potentially expect to result in complications if not addressed urgently such as respiratory distress, impairment of bodily function or dysfunction of bodily organs.   Routine symptom specific, illness specific and/or disease specific instructions were discussed with the patient and/or caregiver at length.   As such, the patient has been evaluated and assessed, work-up was performed and treatment was provided in alignment with urgent care protocols and evidence based medicine.  Patient/parent/caregiver has been advised that  the patient may require follow up for further testing and treatment if the symptoms continue in spite of treatment, as clinically indicated and appropriate.  Patient/parent/caregiver has been advised to return to the [facility] or PCP if no better; to PCP or the Emergency Department if new signs and symptoms develop, or if the current signs or symptoms continue to change or worsen for further workup, evaluation and treatment as clinically indicated and appropriate  The patient will follow up with their current PCP if and as advised. If the patient does not currently have a PCP we will assist them in obtaining one.   The patient may need specialty follow up if the symptoms continue, in spite of conservative treatment and management, for further workup, evaluation, consultation and treatment as clinically indicated and appropriate.   Patient/parent/caregiver verbalized understanding and agreement of plan as discussed.  All questions were addressed during visit.  Please see discharge instructions below for further details of plan.  Discharge Instructions:   Discharge  Instructions      Please continue taking Lexapro once daily at roughly the same time every day but I recommend that for the first few weeks of taking this medication that you take 1/2 tablet and not a whole tablet.  After a few weeks, when you feel that you are tolerating this medication well and starting to feel little lighter in mood and less anxious, please consider increasing to taking a full tablet daily.  I have sent a prescription for Linzess to your pharmacy.  I know your insurance does not cover it but if you will reach out to the company that makes [employer], they have support for Medicare patients to help them get this medication.  I provided you with a print out from their website.  Their phone number is [phone].  Their website is also at the bottom of the page that I printed for you.  I sent a prescription for Prevacid to your pharmacy to treat your heartburn.  Please discontinue all other heartburn medications.  Please take 1 capsule daily with or without food.  It looks like your insurance also will not pay for this medication however I provided you with a good Rx coupon that you will use instead of insurance.  Please presented to the pharmacy when you go to pick up your medication.  If they tell you a prior authorization is required, please let them know that you do not want them to follow-up through your insurance that she will just be using the good Rx coupon.      This office note has been dictated using Museum/gallery curator.  Unfortunately, and despite my best efforts, this method of dictation can sometimes lead to occasional typographical or grammatical errors.  I apologize in advance if this occurs.     Lynden Oxford Scales, PA-C 04/24/22 1341

## 2022-04-23 LAB — URINE CULTURE: Culture: NO GROWTH

## 2022-05-01 ENCOUNTER — Ambulatory Visit (INDEPENDENT_AMBULATORY_CARE_PROVIDER_SITE_OTHER): Payer: Medicare Other

## 2022-05-01 VITALS — BP 120/60 | HR 71 | Temp 99.0°F | Ht 62.0 in | Wt 153.8 lb

## 2022-05-01 DIAGNOSIS — Z0000 Encounter for general adult medical examination without abnormal findings: Secondary | ICD-10-CM | POA: Diagnosis not present

## 2022-05-11 ENCOUNTER — Ambulatory Visit: Admit: 2022-05-11 | Discharge status: Against Medical Advice | Payer: Medicare Other

## 2022-05-11 ENCOUNTER — Telehealth: Payer: Self-pay | Admitting: Internal Medicine

## 2022-05-11 NOTE — Telephone Encounter (Signed)
Patient's granddaughter, Emily Bentley (on [place]) called and stated that she thinks patient is having a reaction to the Lexapro  - patient is itchy and seems more nervous and shaky since staring medication. Please call back [phone].

## 2022-05-13 ENCOUNTER — Encounter: Payer: Self-pay | Admitting: Interventional Cardiology

## 2022-05-13 ENCOUNTER — Ambulatory Visit (INDEPENDENT_AMBULATORY_CARE_PROVIDER_SITE_OTHER): Payer: Medicare Other | Admitting: Interventional Cardiology

## 2022-05-13 VITALS — BP 106/70 | HR 74 | Ht 62.5 in | Wt 151.0 lb

## 2022-05-13 DIAGNOSIS — F419 Anxiety disorder, unspecified: Secondary | ICD-10-CM

## 2022-05-13 DIAGNOSIS — I700 Atherosclerosis of aorta: Secondary | ICD-10-CM | POA: Diagnosis not present

## 2022-05-13 DIAGNOSIS — R072 Precordial pain: Secondary | ICD-10-CM | POA: Diagnosis not present

## 2022-05-13 MED ORDER — METOPROLOL TARTRATE 50 MG PO TABS: ORAL_TABLET | ORAL | 3 refills | Status: DC

## 2022-05-13 MED ORDER — METOPROLOL TARTRATE 50 MG PO TABS: ORAL_TABLET | ORAL | 0 refills | Status: DC

## 2022-05-13 NOTE — Telephone Encounter (Signed)
Called pt. LDVM asking her to give our office a call back to discuss her symptoms. Office number was provided.

## 2022-05-13 NOTE — Patient Instructions (Signed)
Medication Instructions:  Your physician recommends that you continue on your current medications as directed. Please refer to the Current Medication list given to you today.  *If you need a refill on your cardiac medications before your next appointment, please call your pharmacy*  Lab Work: Your physician recommends that you have lab work today- BMET  If you have labs (blood work) drawn today and your tests are completely normal, you will receive your results only by: MyChart Message (if you have MyChart) OR A paper copy in the mail If you have any lab test that is abnormal or we need to change your treatment, we will call you to review the results.   Testing/Procedures: Your physician has requested that you have cardiac CT. Cardiac computed tomography (CT) is a painless test that uses an x-ray machine to take clear, detailed pictures of your heart. For further information please visit [email or web address]. Please follow instruction sheet as given.  Follow-Up: At [facility], you and your health needs are our priority.  As part of our continuing mission to provide you with exceptional heart care, we have created designated Provider Care Teams.  These Care Teams include your primary Cardiologist (physician) and Advanced Practice Providers (APPs -  Physician Assistants and Nurse Practitioners) who all work together to provide you with the care you need, when you need it.  We recommend signing up for the patient portal called "MyChart".  Sign up information is provided on this After Visit Summary.  MyChart is used to connect with patients for Virtual Visits (Telemedicine).  Patients are able to view lab/test results, encounter notes, upcoming appointments, etc.  Non-urgent messages can be sent to your provider as well.   To learn more about what you can do with MyChart, go to [email or web address].    Your next appointment:   As needed if test is okay  The format for your next  appointment:   In Person  Provider:   Larae Grooms, MD {     Your cardiac CT will be scheduled at one of the below locations:   [facility] [address] [place], [place] [address] [phone]  If scheduled at [facility], please arrive at the [facility] and Children's Entrance (Entrance C2) of [facility] 30 minutes prior to test start time. You can use the FREE valet parking offered at entrance C (encouraged to control the heart rate for the test)  Proceed to the [facility] Radiology Department (first floor) to check-in and test prep.  All radiology patients and guests should use entrance C2 at [facility], accessed from [facility], even though the hospital's physical address listed is [address].     Please follow these instructions carefully (unless otherwise directed):  On the Night Before the Test: Be sure to Drink plenty of water. Do not consume any caffeinated/decaffeinated beverages or chocolate 12 hours prior to your test. Do not take any antihistamines 12 hours prior to your test.  On the Day of the Test: Drink plenty of water until 1 hour prior to the test. Do not eat any food 4 hours prior to the test. You may take your regular medications prior to the test.  Take metoprolol (Lopressor) 50 mg two hours prior to test. FEMALES- please wear underwire-free bra if available, avoid dresses & tight clothing      After the Test: Drink plenty of water. After receiving IV contrast, you may experience a mild flushed feeling. This  is normal. On occasion, you may experience a mild rash up to 24 hours after the test. This is not dangerous. If this occurs, you can take Benadryl 25 mg and increase your fluid intake. If you experience trouble breathing, this can be serious. If it is severe call 911 IMMEDIATELY. If it is mild, please call our office.  We will call to schedule your test 2-4 weeks out  understanding that some insurance companies will need an authorization prior to the service being performed.   For non-scheduling related questions, please contact the cardiac imaging nurse navigator should you have any questions/concerns: Marchia Bond, Cardiac Imaging Nurse Navigator Gordy Clement, Cardiac Imaging Nurse Navigator [facility] Direct Office Dial: [phone]   For scheduling needs, including cancellations and rescheduling, please call [place], [phone].    Important Information About Sugar

## 2022-05-13 NOTE — Progress Notes (Signed)
Cardiology Office Note   Date:  05/13/2022   ID:  Emily Bentley, DOB Oct 24, 1935, MRN [id number]  PCP:  [name], MD    No chief complaint on file.    Wt Readings from Last 3 Encounters:  05/13/22 151 lb (68.5 kg)  05/01/22 153 lb 12.8 oz (69.8 kg)  04/21/22 154 lb 9.6 oz (70.1 kg)       History of Present Illness: Emily Bentley is a 87 y.o. female who is being seen today for the evaluation of atypical chest pain at the request of [name], *.   Records from PMD show: "worsening anxiety and chest pains lasting 30-60 minutes resolve with rest or tramadol in the side and into the neck area. Prior SVT s/p ablation but does not feel similar. "  She again reports worsening anxiety today.  SHe had an episode at the dentist where she felt palpitations during a dental cleaning.  She was able to drive home.    She has some random chest pains- lasting a few seconds.  Not related to activity. She has been tired.  She is on new anxiety meds.   She walks some in the house but feels that she is tired.   Denies :  Dizziness. Leg edema. Nitroglycerin use. Orthopnea. Paroxysmal nocturnal dyspnea. Shortness of breath. Syncope.    Has a feeling of her feet being hot and radiating up her body.  She has some unsteadiness with walking as well.   Past Medical History:  Diagnosis Date   Allergy    Arthritis    Asthma    Cataract    Chronic allergic rhinitis    with asthmatic component   Diverticulosis of colon (without mention of hemorrhage)    GERD (gastroesophageal reflux disease)    Hearing impairment    Hiatal hernia    History of bursitis    right shoulder   History of cochlear implant    Cant have an MRI   History of colon polyps    History of pneumonia    History of PSVT (paroxysmal supraventricular tachycardia)    Hormone replacement therapy (postmenopausal)    IBS (irritable bowel syndrome)    Peptic stricture of esophagus    Watermelon  stomach     Past Surgical History:  Procedure Laterality Date   ABDOMINAL HYSTERECTOMY     APPENDECTOMY     CHOLECYSTECTOMY     COCHLEAR IMPLANT Left Oct '14   Done at [facility] - great results / Serial No: [id number] / Model: [id number]   ESOPHAGOGASTRODUODENOSCOPY     EYE SURGERY     POLYPECTOMY     TUBAL LIGATION     urethral dilatations     VENTRICULAR ABLATION SURGERY       Current Outpatient Medications  Medication Sig Dispense Refill   acetaminophen (TYLENOL) 650 MG CR tablet Take 650 mg by mouth every 8 (eight) hours as needed for pain.     azelastine (ASTELIN) 0.1 % nasal spray Place 2 sprays into both nostrils 2 (two) times daily. 30 mL 0   escitalopram (LEXAPRO) 10 MG tablet Take 1 tablet (10 mg total) by mouth daily. 90 tablet 1   fluticasone (FLONASE) 50 MCG/ACT nasal spray Place 2 sprays into both nostrils daily. 1 g 0   lansoprazole (PREVACID) 30 MG capsule Take 1 capsule (30 mg total) by mouth daily at 12 noon. 90 capsule 1   loratadine (CLARITIN) 10 MG tablet Take 10  mg by mouth daily.     magnesium hydroxide (MILK OF MAGNESIA) 400 MG/5ML suspension Take 30 mLs by mouth daily as needed for mild constipation.     multivitamin-iron-minerals-folic acid (CENTRUM) chewable tablet Chew 1 tablet by mouth daily.     Sennosides (SENOKOT EXTRA STRENGTH) 17.2 MG TABS Take 1 tablet (17.2 mg total) by mouth daily. 30 tablet 0   traMADol (ULTRAM) 50 MG tablet TAKE 1/2 TO 1 TABLET BY MOUTH TWICE DAILY 30 tablet 3   No current facility-administered medications for this visit.    Allergies:   Sulfamethoxazole-trimethoprim    Social History:  The patient  reports that she has never smoked. She has never used smokeless tobacco. She reports that she does not drink alcohol and does not use drugs.   Family History:  The patient's family history includes Cancer in her brother, maternal aunt, and sister; Coronary artery disease in her father; Diabetes in her mother; Hypertension  in her father; Uterine cancer in her mother.    ROS:  Please see the history of present illness.   Otherwise, review of systems are positive for anxiety.   All other systems are reviewed and negative.    PHYSICAL EXAM: VS:  BP 106/70   Pulse 74   Ht 5' 2.5" (1.588 m)   Wt 151 lb (68.5 kg)   SpO2 98%   BMI 27.18 kg/m  , BMI Body mass index is 27.18 kg/m. GEN: Well nourished, well developed, in no acute distress HEENT: normal Neck: no JVD, carotid bruits, or masses Cardiac: RRR; no murmurs, rubs, or gallops,no edema  Respiratory:  clear to auscultation bilaterally, normal work of breathing GI: soft, nontender, nondistended, + BS MS: no deformity or atrophy Skin: warm and dry, no rash Neuro:  Strength and sensation are intact Psych: euthymic mood, full affect   EKG:   The ekg ordered in 3/23 demonstrates NSR, no ST chnages   Recent Labs: 01/28/2022: ALT 13; BUN 14; Creatinine, Ser 1.06; Hemoglobin 13.5; Platelets 215.0; Potassium 4.2; Sodium 141; TSH 4.63   Lipid Panel    Component Value Date/Time   CHOL 180 09/27/2019 1108   TRIG 117.0 09/27/2019 1108   HDL 47.40 09/27/2019 1108   CHOLHDL 4 09/27/2019 1108   VLDL 23.4 09/27/2019 1108   LDLCALC 110 (H) 09/27/2019 1108   LDLDIRECT 125.4 11/24/2006 0917     Other studies Reviewed: Additional studies/ records that were reviewed today with results demonstrating: Labs reviewed, normal creatinine in March.   ASSESSMENT AND PLAN:  Precordial chest pain: Plan for a CT angiogram of the coronary arteries to evaluate for any significant CAD.  Prior resting heart rate was 63.  Given her age, will use metoprolol 50 mg x 1. Anxiety: Working with primary care doctor to find a medication that helps. Aortic atherosclerosis: Noted on prior CT scan.  Last LDL noted 110.  Depending on calcium score from the CT, may need to consider statin.   Current medicines are reviewed at length with the patient today.  The patient concerns  regarding her medicines were addressed.  The following changes have been made:  No change  Labs/ tests ordered today include: CTA coronaries No orders of the defined types were placed in this encounter.   Recommend 150 minutes/week of aerobic exercise Low fat, low carb, high fiber diet recommended  Disposition:   FU in based on results of CTA   Signed, Larae Grooms, MD  05/13/2022 10:08 AM    [facility] [address], [place], [place]  [address] Phone: [phone]; Fax: [phone]

## 2022-05-14 LAB — BASIC METABOLIC PANEL
BUN/Creatinine Ratio: 11 — ABNORMAL LOW (ref 12–28)
BUN: 12 mg/dL (ref 8–27)
CO2: 25 mmol/L (ref 20–29)
Calcium: 9.4 mg/dL (ref 8.7–10.3)
Chloride: 105 mmol/L (ref 96–106)
Creatinine, Ser: 1.1 mg/dL — ABNORMAL HIGH (ref 0.57–1.00)
Glucose: 97 mg/dL (ref 70–99)
Potassium: 4.8 mmol/L (ref 3.5–5.2)
Sodium: 141 mmol/L (ref 134–144)
eGFR: 50 mL/min/{1.73_m2} — ABNORMAL LOW (ref >59)

## 2022-05-25 ENCOUNTER — Other Ambulatory Visit: Payer: Self-pay

## 2022-05-25 DIAGNOSIS — F419 Anxiety disorder, unspecified: Secondary | ICD-10-CM

## 2022-05-26 ENCOUNTER — Telehealth: Payer: Self-pay | Admitting: *Deleted

## 2022-05-26 NOTE — Chronic Care Management (AMB) (Signed)
  Care Coordination  Note  05/26/2022 Name: Emily Bentley MRN: [id number] DOB: 03-19-1935  Emily Bentley is a 87 y.o. year old female who is a primary care patient of [name], MD. I reached out to [name] by phone today to offer care coordination services.      Ms. Nunnelley was given information about Care Coordination services today including:  The Care Coordination services include support from the care team which includes your Nurse Coordinator, Clinical Social Worker, or Pharmacist.  The Care Coordination team is here to help remove barriers to the health concerns and goals most important to you. Care Coordination services are voluntary and the patient may decline or stop services at any time by request to their care team member.   Patient agreed to services and verbal consent obtained.   Follow up plan: Telephone appointment with care coordination team member scheduled for: 05/29/2022  Julian Hy, [place] Direct Dial: [phone]

## 2022-05-29 ENCOUNTER — Ambulatory Visit: Payer: Self-pay | Admitting: Licensed Clinical Social Worker

## 2022-05-29 NOTE — Patient Outreach (Signed)
  Care Coordination   Initial Visit Note   05/29/2022 Name: Emily Bentley MRN: [id number] DOB: 07/31/1935  Emily Bentley is a 87 y.o. year old female who sees [name], MD for primary care. I spoke with  Emily Bentley by phone today. I also spoke via phone with patient granddaughter, Emily Bentley about client needs  What matters to the patients health and wellness today?  Managing depression   Goals Addressed               This Visit's Progress     Patient Stated she feels depressed and would like to talk about counseling support (pt-stated)        Care Coordination Interventions:   Depression screen reviewed  PHQ 2/9 completed: GAD 7 completed Active Listening Used Emotional Support Provided. Provided some counseling support related to client needs Spoke of Care Coordination Support Program services Discussed counseling resources         SDOH assessments and interventions completed:   Yes SDOH Interventions Today    Flowsheet Row Most Recent Value  SDOH Interventions   Depression Interventions/Treatment  Medication, Currently on Treatment       Care Coordination Interventions Activated:  Yes Care Coordination Interventions:  Yes, provided  Follow up plan: Follow up call scheduled for 06/26/22 at 11:00 AM   Encounter Outcome:  Pt. Visit Completed  Norva Riffle.Jodiann Ognibene MSW, [place] Holiday representative [facility] Care Management [phone]

## 2022-05-29 NOTE — Patient Instructions (Signed)
Visit Information  Thank you for taking time to visit with me today. Please don't hesitate to contact me if I can be of assistance to you.   Following are the goals we discussed today:   Goals Addressed               This Visit's Progress     Patient Stated she feels depressed and would like to talk about counseling support (pt-stated)        Care Coordination Interventions:   Depression screen reviewed  PHQ 2/9 completed: GAD 7 completed Active Listening Used Emotional Support Provided. Provided some counseling support related to client needs Spoke of Care Coordination Support Program services Discussed counseling resources         Our next appointment is by telephone on 06/26/22 at 11:00 AM   Please call the care guide team at [phone] if you need to cancel or reschedule your appointment.   If you are experiencing a Mental Health or [place] or need someone to talk to, please go to [facility] [place] [phone])   Norva Riffle.Islam Villescas MSW, LCSW Licensed Holiday representative [facility] Care  Coordination SW  [phone]

## 2022-06-10 ENCOUNTER — Encounter: Payer: Self-pay | Admitting: Internal Medicine

## 2022-06-11 ENCOUNTER — Ambulatory Visit (INDEPENDENT_AMBULATORY_CARE_PROVIDER_SITE_OTHER): Payer: Medicare Other | Admitting: Nurse Practitioner

## 2022-06-11 VITALS — BP 136/78 | HR 69 | Temp 99.0°F | Ht 62.5 in | Wt 152.0 lb

## 2022-06-11 DIAGNOSIS — H9203 Otalgia, bilateral: Secondary | ICD-10-CM | POA: Diagnosis not present

## 2022-06-11 DIAGNOSIS — F419 Anxiety disorder, unspecified: Secondary | ICD-10-CM | POA: Diagnosis not present

## 2022-06-11 NOTE — Assessment & Plan Note (Signed)
Reports starting counseling services soon, will increase Lexapro to 10 mg/day by mouth.  She will follow-up in approximately 2 weeks with PCP for close monitoring per her request.

## 2022-06-11 NOTE — Progress Notes (Signed)
Established Patient Office Visit  Subjective   Patient ID: Emily Bentley, female    DOB: [birth year]  Age: 87 y.o. MRN: [id number]  Chief Complaint  Patient presents with   Anxiety   Ear concerns    Feels stuffy at times, has cochlear. Noises seems very loud    Patient arrives today for acute visit for the above.  She has cochlear implant to her left ear which has been present for over 12 years per her report.  She reports some ear discomfort.  She also reports that her anxiety is not well controlled.  She was started on Lexapro 10 mg/day by mouth, but did not tolerate this dose initially so she has been on 1/2 tablet ('5mg'$ )/day by mouth.  She reports that she sleeps well but will experience anxiety and racing thoughts fairly regularly.  She tries to get out of the house on a daily basis which seems to help with her anxiety.  She does have passive thoughts of suicide such as she does not want to deal with his anxiety any longer but denies any intention or plan to harm herself.    Review of Systems  Psychiatric/Behavioral:  Negative for suicidal ideas. The patient is nervous/anxious. The patient does not have insomnia.       Objective:     BP 136/78 (BP Location: Left Arm, Patient Position: Sitting, Cuff Size: Large)   Pulse 69   Temp 99 F (37.2 C) (Oral)   Ht 5' 2.5" (1.588 m)   Wt 152 lb (68.9 kg)   SpO2 98%   BMI 27.36 kg/m    Physical Exam Vitals reviewed.  Constitutional:      General: She is not in acute distress.    Appearance: Normal appearance.  HENT:     Head: Normocephalic and atraumatic.     Right Ear: Tympanic membrane, ear canal and external ear normal.     Left Ear: Tympanic membrane, ear canal and external ear normal.  Neck:     Vascular: No carotid bruit.  Cardiovascular:     Rate and Rhythm: Normal rate and regular rhythm.     Pulses: Normal pulses.     Heart sounds: Normal heart sounds.  Pulmonary:     Effort: Pulmonary effort is normal.      Breath sounds: Normal breath sounds.  Skin:    General: Skin is warm and dry.  Neurological:     General: No focal deficit present.     Mental Status: She is alert and oriented to person, place, and time.  Psychiatric:        Mood and Affect: Mood normal.        Behavior: Behavior normal.        Judgment: Judgment normal.      No results found for any visits on 06/11/22.    The ASCVD Risk score (Arnett DK, et al., 2019) failed to calculate for the following reasons:   The 2019 ASCVD risk score is only valid for ages 74 to 6    Assessment & Plan:   Problem List Items Addressed This Visit       Other   Anxiety - Primary    Reports starting counseling services soon, will increase Lexapro to 10 mg/day by mouth.  She will follow-up in approximately 2 weeks with PCP for close monitoring per her request.      Ear pain    No obvious signs of infection noted on exam today.  Patient  encouraged to follow-up with ENT.       Return in about 2 weeks (around 06/25/2022) for f/u with Dr. Sharlet Salina.    Ailene Ards, NP

## 2022-06-11 NOTE — Assessment & Plan Note (Signed)
No obvious signs of infection noted on exam today.  Patient encouraged to follow-up with ENT.

## 2022-06-19 ENCOUNTER — Encounter (HOSPITAL_COMMUNITY): Payer: Self-pay

## 2022-06-25 ENCOUNTER — Ambulatory Visit (INDEPENDENT_AMBULATORY_CARE_PROVIDER_SITE_OTHER): Payer: Medicare Other | Admitting: Internal Medicine

## 2022-06-25 DIAGNOSIS — H9203 Otalgia, bilateral: Secondary | ICD-10-CM | POA: Diagnosis not present

## 2022-06-25 DIAGNOSIS — F419 Anxiety disorder, unspecified: Secondary | ICD-10-CM

## 2022-06-25 NOTE — Progress Notes (Signed)
   Subjective:   Patient ID: Emily Bentley, female    DOB: June 17, 1935, 87 y.o.   MRN: [id number]  HPI The patient is an 87 YO female coming in for ongoing anxiety and problems with cochlear implants/hearing aids  Review of Systems  Constitutional:  Positive for activity change. Negative for appetite change.  HENT:  Positive for ear pain and hearing loss.   Eyes: Negative.   Respiratory:  Negative for cough, chest tightness and shortness of breath.   Cardiovascular:  Negative for chest pain, palpitations and leg swelling.  Gastrointestinal:  Negative for abdominal distention, abdominal pain, constipation, diarrhea, nausea and vomiting.  Musculoskeletal: Negative.   Skin: Negative.   Neurological: Negative.   Psychiatric/Behavioral:  Positive for decreased concentration and sleep disturbance. The patient is nervous/anxious.     Objective:  Physical Exam Constitutional:      Appearance: She is well-developed.  HENT:     Head: Normocephalic and atraumatic.     Right Ear: Tympanic membrane normal.     Left Ear: Tympanic membrane normal.  Cardiovascular:     Rate and Rhythm: Normal rate and regular rhythm.  Pulmonary:     Effort: Pulmonary effort is normal. No respiratory distress.     Breath sounds: Normal breath sounds. No wheezing or rales.  Abdominal:     General: Bowel sounds are normal. There is no distension.     Palpations: Abdomen is soft.     Tenderness: There is no abdominal tenderness. There is no rebound.  Musculoskeletal:     Cervical back: Normal range of motion.  Skin:    General: Skin is warm and dry.  Neurological:     Mental Status: She is alert and oriented to person, place, and time.     Coordination: Coordination normal.     Vitals:   06/25/22 1109  BP: 118/80  Pulse: 74  Temp: 98.4 F (36.9 C)  TempSrc: Oral  SpO2: 97%  Weight: 149 lb (67.6 kg)  Height: 5' 2.5" (1.588 m)    Assessment & Plan:

## 2022-06-25 NOTE — Patient Instructions (Signed)
Keep taking the 1/2 pill of lexapro. Make sure to call the counselor to try that.

## 2022-06-26 ENCOUNTER — Encounter: Payer: Self-pay | Admitting: Internal Medicine

## 2022-06-26 ENCOUNTER — Ambulatory Visit: Payer: Self-pay | Admitting: Licensed Clinical Social Worker

## 2022-06-26 NOTE — Patient Outreach (Signed)
  Care Coordination   Follow Up Visit Note   06/26/2022 Name: SHONICE WRISLEY MRN: [id number] DOB: Jun 25, 1935  SHAYLON GILLEAN is a 87 y.o. year old female who sees [name], MD for primary care. I spoke with  [name] / granddaughter of client, [name], by phone today  What matters to the patients health and wellness today? Client wants to maintain current activity level. Client wants to complete daily ADLs. Client wants to manage anxiety issues    Goals Addressed             This Visit's Progress    Patient wants to maintain current actiities. Client wants to complete daily ADLs. Client wants to manage anxiety issues.       Care Coordination Interventions:  Active listening / Reflection utilized  Emotional Support Provided Discussed Care Coordination program support with [name], granddaughter of client Reviewed client needs. Client is now taking Lexapro as prescribed. Client gets anxious occasionally. [name] is supportive. Client drives to most appointments. Client eats several small meals daily.   Reviewed mood of client. [name] said client is still having grief issues over death of her sister. Provided counseling support to [facility] regarding needs of client      SDOH assessments and interventions completed:  No    Care Coordination Interventions Activated:  Yes  Care Coordination Interventions:  Yes, provided   Follow up plan: Follow up call scheduled for 08/04/22 at 1:00 PM     Encounter Outcome:  Pt. Visit Completed

## 2022-06-26 NOTE — Assessment & Plan Note (Signed)
Ongoing and she is planning to see her specialist about this. She has also ENT and will see them if her cochlear specialist cannot help. No infection on exam today. Suspect the highly sensitive hearing could be contributing some to anxiety.

## 2022-06-26 NOTE — Assessment & Plan Note (Signed)
She was unable to tolerate 10 mg lexapro daily so is taking 5 mg daily and this is helping minimally. She has not yet called about counseling services highly recommended this to her.

## 2022-06-26 NOTE — Patient Instructions (Addendum)
Visit Information  Thank you for taking time to visit with me today. Please don't hesitate to contact me if I can be of assistance to you before our next scheduled telephone appointment.  Following are the goals we discussed today:   Our next appointment is by telephone on 08/04/22 at 1:00 PM   Please call the care guide team at [phone] if you need to cancel or reschedule your appointment.   If you are experiencing a Mental Health or [place] or need someone to talk to, please go to [facility] [place] [phone])   Following is a copy of your full plan of care:   Care Coordination Interventions:  Active listening / Reflection utilized  Emotional Support Provided Discussed Care Coordination program support with [name], granddaughter of client Reviewed client needs. Client is now taking Lexapro as prescribed. Client gets anxious occasionally. [name] is supportive. Client drives to most appointments. Client eats several small meals daily.   Reviewed mood of client. [name] said client is still having grief issues over death of her sister. Provided counseling support to [facility] regarding needs of client   Ms. Amato was given information about Care Management services by the embedded care coordination team including:  Care Management services include personalized support from designated clinical staff supervised by her physician, including individualized plan of care and coordination with other care providers 24/7 contact phone numbers for assistance for urgent and routine care needs. The patient may stop CCM services at any time (effective at the end of the month) by phone call to the office staff.  Patient agreed to services and verbal consent obtained.   Norva Riffle.Aadhav Uhlig MSW, [place] Holiday representative [facility] Care Management [phone]

## 2022-07-14 ENCOUNTER — Encounter (HOSPITAL_COMMUNITY): Payer: Self-pay

## 2022-07-16 DIAGNOSIS — Z9621 Cochlear implant status: Secondary | ICD-10-CM | POA: Diagnosis not present

## 2022-07-16 DIAGNOSIS — H9203 Otalgia, bilateral: Secondary | ICD-10-CM | POA: Insufficient documentation

## 2022-07-21 ENCOUNTER — Ambulatory Visit (INDEPENDENT_AMBULATORY_CARE_PROVIDER_SITE_OTHER): Payer: Medicare Other | Admitting: Obstetrics and Gynecology

## 2022-07-21 ENCOUNTER — Encounter: Payer: Self-pay | Admitting: Obstetrics and Gynecology

## 2022-07-21 VITALS — BP 110/70 | Temp 98.7°F | Wt 151.0 lb

## 2022-07-21 DIAGNOSIS — N3090 Cystitis, unspecified without hematuria: Secondary | ICD-10-CM

## 2022-07-21 LAB — URINALYSIS, COMPLETE
Bilirubin Urine: NEGATIVE
Glucose, UA: NEGATIVE
Hyaline Cast: NONE SEEN /LPF
Ketones, ur: NEGATIVE
Nitrite: NEGATIVE
Protein, ur: NEGATIVE
Specific Gravity, Urine: 1.015 (ref 1.001–1.035)
WBC, UA: >=60 /HPF — AB (ref 0–5)
pH: 7 (ref 5.0–8.0)

## 2022-07-21 MED ORDER — NITROFURANTOIN MONOHYD MACRO 100 MG PO CAPS: 100.0000 mg | ORAL_CAPSULE | Freq: Two times a day (BID) | ORAL | 0 refills | Status: DC

## 2022-07-21 NOTE — Patient Instructions (Signed)
Take azo, 1 tablet 3 x a day for 2 days.

## 2022-07-21 NOTE — Progress Notes (Signed)
GYNECOLOGY  VISIT   HPI: 87 y.o.   Widowed Black or [place] American Not Hispanic or Latino  female   [phone] with No LMP recorded. Patient has had a hysterectomy.   here for urninary urgency and frequency She is also having some dysuria. She states that it started last week. She used monistat for 4 days but is still having the symptoms.  No fever, no flank pain.   Recent BMP with Creatinine of 1.1 and eGFR 50.  She has been struggling with anxiety, on medication. She has lost both of her sisters, has 2 living brothers.   GYNECOLOGIC HISTORY: No LMP recorded. Patient has had a hysterectomy. Contraception: pmp and hysterectomy  Menopausal hormone therapy: none         OB History     Gravida  5   Para  4   Term  4   Preterm      AB  1   Living  4      SAB  1   IAB      Ectopic      Multiple      Live Births  4              Patient Active Problem List   Diagnosis Date Noted   Atypical chest pain 04/21/2022   Blurred vision 01/28/2022   Vertigo 01/28/2022   Palpitations 01/28/2022   Ear pain 08/29/2021   Allergic rhinitis due to pollen 07/30/2021   Neck swelling 12/23/2020   Laryngopharyngeal reflux 10/28/2020   Vaginal prolapse 10/18/2020   Vitamin D deficiency 08/20/2020   Tingling 08/20/2020   Myalgia 08/20/2020   Bone pain 08/20/2020   Bilateral thoracic back pain 08/20/2020   Dysphagia 01/04/2020   Chronic bilateral thoracic back pain 09/28/2019   Scalp cyst 09/28/2019   Allergic rhinitis 09/28/2019   Constipation 08/25/2018   Elevated blood pressure reading in office without diagnosis of hypertension 08/25/2018   Anxiety 02/04/2018   Fatigue 12/15/2016   Degenerative disc disease, lumbar 09/16/2015   Routine general medical examination at a health care facility 07/02/2013   H/O supraventricular tachycardia 08/25/2012   Bilateral sensorineural hearing loss 07/28/2012   OAB (overactive bladder) 05/25/2012   Osteopenia 10/09/2009   Cochlear  implant in place 12/30/2007   GERD (gastroesophageal reflux disease) 12/30/2007    Past Medical History:  Diagnosis Date   Allergy    Arthritis    Asthma    Cataract    Chronic allergic rhinitis    with asthmatic component   Diverticulosis of colon (without mention of hemorrhage)    GERD (gastroesophageal reflux disease)    Hearing impairment    Hiatal hernia    History of bursitis    right shoulder   History of cochlear implant    Cant have an MRI   History of colon polyps    History of pneumonia    History of PSVT (paroxysmal supraventricular tachycardia)    Hormone replacement therapy (postmenopausal)    IBS (irritable bowel syndrome)    Peptic stricture of esophagus    Watermelon stomach     Past Surgical History:  Procedure Laterality Date   ABDOMINAL HYSTERECTOMY     APPENDECTOMY     CHOLECYSTECTOMY     COCHLEAR IMPLANT Left Oct '14   Done at [facility] - great results / Serial No: [id number] / Model: CI422   ESOPHAGOGASTRODUODENOSCOPY     EYE SURGERY     POLYPECTOMY     TUBAL  LIGATION     urethral dilatations     VENTRICULAR ABLATION SURGERY      Current Outpatient Medications  Medication Sig Dispense Refill   acetaminophen (TYLENOL) 650 MG CR tablet Take 650 mg by mouth every 8 (eight) hours as needed for pain.     aspirin EC 81 MG tablet Take 81 mg by mouth daily. Swallow whole.     azelastine (ASTELIN) 0.1 % nasal spray Place 2 sprays into both nostrils 2 (two) times daily. 30 mL 0   escitalopram (LEXAPRO) 10 MG tablet Take 1 tablet (10 mg total) by mouth daily. 90 tablet 1   fluticasone (FLONASE) 50 MCG/ACT nasal spray Place 2 sprays into both nostrils daily. 1 g 0   lansoprazole (PREVACID) 30 MG capsule Take 1 capsule (30 mg total) by mouth daily at 12 noon. 90 capsule 1   loratadine (CLARITIN) 10 MG tablet Take 10 mg by mouth daily.     magnesium hydroxide (MILK OF MAGNESIA) 400 MG/5ML suspension Take 30 mLs by mouth daily as needed for mild  constipation.     metoprolol tartrate (LOPRESSOR) 50 MG tablet Take one tablet 2 hours prior to CT test 1 tablet 0   montelukast (SINGULAIR) 10 MG tablet Take 10 mg by mouth daily.     multivitamin-iron-minerals-folic acid (CENTRUM) chewable tablet Chew 1 tablet by mouth daily.     Sennosides (SENOKOT EXTRA STRENGTH) 17.2 MG TABS Take 1 tablet (17.2 mg total) by mouth daily. 30 tablet 0   traMADol (ULTRAM) 50 MG tablet TAKE 1/2 TO 1 TABLET BY MOUTH TWICE DAILY 30 tablet 3   No current facility-administered medications for this visit.     ALLERGIES: Sulfamethoxazole-trimethoprim  Family History  Problem Relation Age of Onset   Diabetes Mother    Uterine cancer Mother    Coronary artery disease Father        CABG x 5 in 1985   Hypertension Father    Cancer Sister    Cancer Brother    Cancer Maternal Aunt    Colon cancer Neg Hx     Social History   Socioeconomic History   Marital status: Widowed    Spouse name: Not on file   Number of children: 4   Years of education: Not on file   Highest education level: Not on file  Occupational History   Occupation: retired    Fish farm manager: [employer]  Tobacco Use   Smoking status: Never   Smokeless tobacco: Never  Vaping Use   Vaping Use: Never used  Substance and Sexual Activity   Alcohol use: No   Drug use: No   Sexual activity: Not Currently  Other Topics Concern   Not on file  Social History Narrative   Married '58- 63 years, divorced; married '68- widowed '721 son '66, 3 daughters '59, '60, '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 Retired '76Live- aloneOcc caffeine       07/2021 Hard of hearing - using new closed caption device    Social Determinants of Health   Financial Resource Strain: Low Risk  (05/01/2022)   Overall Financial Resource Strain (CARDIA)    Difficulty of Paying Living Expenses: Not hard at all  Food Insecurity: No Food Insecurity (05/01/2022)   Hunger Vital Sign    Worried About  Running Out of Food in the Last Year: Never true    Ran Out of Food in the Last Year: Never true  Transportation Needs: No Transportation Needs (05/01/2022)  PRAPARE - Hydrologist (Medical): No    Lack of Transportation (Non-Medical): No  Physical Activity: Sufficiently Active (05/01/2022)   Exercise Vital Sign    Days of Exercise per Week: 5 days    Minutes of Exercise per Session: 30 min  Stress: Stress Concern Present (05/01/2022)   [place]    Feeling of Stress : To some extent  Social Connections: Moderately Integrated (05/01/2022)   Social Connection and Isolation Panel [NHANES]    Frequency of Communication with Friends and Family: More than three times a week    Frequency of Social Gatherings with Friends and Family: More than three times a week    Attends Religious Services: 1 to 4 times per year    Active Member of [employer] or Organizations: Yes    Attends Archivist Meetings: 1 to 4 times per year    Marital Status: Widowed  Intimate Partner Violence: Not At Risk (05/01/2022)   Humiliation, Afraid, Rape, and Kick questionnaire    Fear of Current or Ex-Partner: No    Emotionally Abused: No    Physically Abused: No    Sexually Abused: No    Review of Systems  Genitourinary:  Positive for dysuria, frequency and urgency.    PHYSICAL EXAMINATION:    BP 110/70   Temp 98.7 F (37.1 C)   Wt 151 lb (68.5 kg)   SpO2 98%   BMI 27.18 kg/m     General appearance: alert, cooperative and appears stated age CVA: not tender Abdomen: soft, minimally tender in the SP region; non distended, no masses,  no organomegaly  1. Cystitis - Urinalysis, Complete - Urine Culture -Her GFR was 50 at last check.  - nitrofurantoin, macrocrystal-monohydrate, (MACROBID) 100 MG capsule; Take 1 capsule (100 mg total) by mouth 2 (two) times daily.  Dispense: 10 capsule; Refill: 0 -She can take  OTC azo for discomfort

## 2022-07-24 LAB — URINE CULTURE
MICRO NUMBER:: 13905643
SPECIMEN QUALITY:: ADEQUATE

## 2022-07-30 ENCOUNTER — Encounter: Payer: Self-pay | Admitting: Obstetrics and Gynecology

## 2022-07-30 ENCOUNTER — Ambulatory Visit (INDEPENDENT_AMBULATORY_CARE_PROVIDER_SITE_OTHER): Payer: Medicare Other | Admitting: Obstetrics and Gynecology

## 2022-07-30 DIAGNOSIS — R300 Dysuria: Secondary | ICD-10-CM

## 2022-07-30 DIAGNOSIS — N3090 Cystitis, unspecified without hematuria: Secondary | ICD-10-CM

## 2022-07-30 DIAGNOSIS — N760 Acute vaginitis: Secondary | ICD-10-CM

## 2022-07-30 DIAGNOSIS — N762 Acute vulvitis: Secondary | ICD-10-CM

## 2022-07-30 LAB — URINALYSIS, COMPLETE
Bilirubin Urine: NEGATIVE
Glucose, UA: NEGATIVE
Hyaline Cast: NONE SEEN /LPF
Ketones, ur: NEGATIVE
Nitrite: NEGATIVE
Specific Gravity, Urine: 1.02 (ref 1.001–1.035)
WBC, UA: >=60 /HPF — AB (ref 0–5)
pH: 7 (ref 5.0–8.0)

## 2022-07-30 LAB — WET PREP FOR TRICH, YEAST, CLUE

## 2022-07-30 MED ORDER — AMOXICILLIN-POT CLAVULANATE 500-125 MG PO TABS: ORAL_TABLET | ORAL | 0 refills | Status: DC

## 2022-07-30 MED ORDER — METRONIDAZOLE 500 MG PO TABS: 500.0000 mg | ORAL_TABLET | Freq: Two times a day (BID) | ORAL | 0 refills | Status: DC

## 2022-07-30 NOTE — Progress Notes (Signed)
GYNECOLOGY  VISIT   HPI: 87 y.o.   Widowed Black or [place] American Not Hispanic or Latino  female   [phone] with No LMP recorded. Patient has had a hysterectomy.   here for burning with urination. She states that she finished her antibiotic for a UTI on Sunday (the culture grew out Klebsiella). She felt better with the medication, but she started to feel burning with urination again on Monday. She is starting to have external dysuria, some pain at the end of urination. Slight frequency and urgency. Some vulvar irritation.   No fever, no flank pain.   Recent BMP with Creatinine of 1.1 and eGFR 50.  GYNECOLOGIC HISTORY: No LMP recorded. Patient has had a hysterectomy. Contraception:pmp Menopausal hormone therapy: none         OB History     Gravida  5   Para  4   Term  4   Preterm      AB  1   Living  4      SAB  1   IAB      Ectopic      Multiple      Live Births  4              Patient Active Problem List   Diagnosis Date Noted   Otalgia of both ears 07/16/2022   Atypical chest pain 04/21/2022   Blurred vision 01/28/2022   Vertigo 01/28/2022   Palpitations 01/28/2022   Ear pain 08/29/2021   Allergic rhinitis due to pollen 07/30/2021   Neck swelling 12/23/2020   Laryngopharyngeal reflux 10/28/2020   Vaginal prolapse 10/18/2020   Vitamin D deficiency 08/20/2020   Tingling 08/20/2020   Myalgia 08/20/2020   Bone pain 08/20/2020   Bilateral thoracic back pain 08/20/2020   Dysphagia 01/04/2020   Chronic bilateral thoracic back pain 09/28/2019   Scalp cyst 09/28/2019   Allergic rhinitis 09/28/2019   Constipation 08/25/2018   Elevated blood pressure reading in office without diagnosis of hypertension 08/25/2018   Anxiety 02/04/2018   Fatigue 12/15/2016   Degenerative disc disease, lumbar 09/16/2015   Routine general medical examination at a health care facility 07/02/2013   H/O supraventricular tachycardia 08/25/2012   Bilateral sensorineural  hearing loss 07/28/2012   OAB (overactive bladder) 05/25/2012   Osteopenia 10/09/2009   Cochlear implant in place 12/30/2007   GERD (gastroesophageal reflux disease) 12/30/2007    Past Medical History:  Diagnosis Date   Allergy    Arthritis    Asthma    Cataract    Chronic allergic rhinitis    with asthmatic component   Diverticulosis of colon (without mention of hemorrhage)    GERD (gastroesophageal reflux disease)    Hearing impairment    Hiatal hernia    History of bursitis    right shoulder   History of cochlear implant    Cant have an MRI   History of colon polyps    History of pneumonia    History of PSVT (paroxysmal supraventricular tachycardia)    Hormone replacement therapy (postmenopausal)    IBS (irritable bowel syndrome)    Peptic stricture of esophagus    Watermelon stomach     Past Surgical History:  Procedure Laterality Date   ABDOMINAL HYSTERECTOMY     APPENDECTOMY     CHOLECYSTECTOMY     COCHLEAR IMPLANT Left Oct '14   Done at [facility] - great results / Serial No: [id number] / Model: CI422   ESOPHAGOGASTRODUODENOSCOPY  EYE SURGERY     POLYPECTOMY     TUBAL LIGATION     urethral dilatations     VENTRICULAR ABLATION SURGERY      Current Outpatient Medications  Medication Sig Dispense Refill   acetaminophen (TYLENOL) 650 MG CR tablet Take 650 mg by mouth every 8 (eight) hours as needed for pain.     aspirin EC 81 MG tablet Take 81 mg by mouth daily. Swallow whole.     azelastine (ASTELIN) 0.1 % nasal spray Place 2 sprays into both nostrils 2 (two) times daily. 30 mL 0   escitalopram (LEXAPRO) 10 MG tablet Take 1 tablet (10 mg total) by mouth daily. 90 tablet 1   fluticasone (FLONASE) 50 MCG/ACT nasal spray Place 2 sprays into both nostrils daily. 1 g 0   lansoprazole (PREVACID) 30 MG capsule Take 1 capsule (30 mg total) by mouth daily at 12 noon. 90 capsule 1   loratadine (CLARITIN) 10 MG tablet Take 10 mg by mouth daily.      magnesium hydroxide (MILK OF MAGNESIA) 400 MG/5ML suspension Take 30 mLs by mouth daily as needed for mild constipation.     metoprolol tartrate (LOPRESSOR) 50 MG tablet Take one tablet 2 hours prior to CT test 1 tablet 0   montelukast (SINGULAIR) 10 MG tablet Take 10 mg by mouth daily.     multivitamin-iron-minerals-folic acid (CENTRUM) chewable tablet Chew 1 tablet by mouth daily.     nitrofurantoin, macrocrystal-monohydrate, (MACROBID) 100 MG capsule Take 1 capsule (100 mg total) by mouth 2 (two) times daily. 10 capsule 0   Sennosides (SENOKOT EXTRA STRENGTH) 17.2 MG TABS Take 1 tablet (17.2 mg total) by mouth daily. 30 tablet 0   traMADol (ULTRAM) 50 MG tablet TAKE 1/2 TO 1 TABLET BY MOUTH TWICE DAILY 30 tablet 3   No current facility-administered medications for this visit.     ALLERGIES: Sulfamethoxazole-trimethoprim  Family History  Problem Relation Age of Onset   Diabetes Mother    Uterine cancer Mother    Coronary artery disease Father        CABG x 5 in 1985   Hypertension Father    Cancer Sister    Cancer Brother    Cancer Maternal Aunt    Colon cancer Neg Hx     Social History   Socioeconomic History   Marital status: Widowed    Spouse name: Not on file   Number of children: 4   Years of education: Not on file   Highest education level: Not on file  Occupational History   Occupation: retired    Fish farm manager: [employer]  Tobacco Use   Smoking status: Never   Smokeless tobacco: Never  Vaping Use   Vaping Use: Never used  Substance and Sexual Activity   Alcohol use: No   Drug use: No   Sexual activity: Not Currently  Other Topics Concern   Not on file  Social History Narrative   Married '58- 39 years, divorced; married '68- widowed '721 son '66, 3 daughters '59, '60, '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 Retired '76Live- aloneOcc caffeine       07/2021 Hard of hearing - using new closed caption device    Social Determinants of Health    Financial Resource Strain: Low Risk  (05/01/2022)   Overall Financial Resource Strain (CARDIA)    Difficulty of Paying Living Expenses: Not hard at all  Food Insecurity: No Food Insecurity (05/01/2022)   Hunger Vital Sign  Worried About Charity fundraiser in the Last Year: Never true    [place] in the Last Year: Never true  Transportation Needs: No Transportation Needs (05/01/2022)   PRAPARE - Hydrologist (Medical): No    Lack of Transportation (Non-Medical): No  Physical Activity: Sufficiently Active (05/01/2022)   Exercise Vital Sign    Days of Exercise per Week: 5 days    Minutes of Exercise per Session: 30 min  Stress: Stress Concern Present (05/01/2022)   [place]    Feeling of Stress : To some extent  Social Connections: Moderately Integrated (05/01/2022)   Social Connection and Isolation Panel [NHANES]    Frequency of Communication with Friends and Family: More than three times a week    Frequency of Social Gatherings with Friends and Family: More than three times a week    Attends Religious Services: 1 to 4 times per year    Active Member of [employer] or Organizations: Yes    Attends Archivist Meetings: 1 to 4 times per year    Marital Status: Widowed  Intimate Partner Violence: Not At Risk (05/01/2022)   Humiliation, Afraid, Rape, and Kick questionnaire    Fear of Current or Ex-Partner: No    Emotionally Abused: No    Physically Abused: No    Sexually Abused: No    Review of Systems  Genitourinary:  Positive for dysuria.  All other systems reviewed and are negative.   PHYSICAL EXAMINATION:    There were no vitals taken for this visit.    General appearance: alert, cooperative and appears stated age CVA: not tender Abdomen: soft, non-tender; non distended, no masses,  no organomegaly  Pelvic: External genitalia:  no lesions              Urethra:   normal appearing urethra with no masses, tenderness or lesions              Bartholins and Skenes: normal                 Vagina:atropic appearing vagina with a slight increase in creamy, white vaginal discharge.               Cervix: absent               Chaperone was present for exam.  1. Dysuria Finished antibiotics for a UTI over the weekend, symptoms have started back, more mild - Urinalysis, Complete - Urine Culture  2. Acute vulvitis - WET PREP FOR TRICH, YEAST, CLUE  3. Bacterial vaginitis - metroNIDAZOLE (FLAGYL) 500 MG tablet; Take 1 tablet (500 mg total) by mouth 2 (two) times daily.  Dispense: 14 tablet; Refill: 0  4. Cystitis She will take AZO as well - amoxicillin-clavulanate (AUGMENTIN) 500-125 MG tablet; Take one tablet po BID x 7 days  Dispense: 14 tablet; Refill: 0

## 2022-07-30 NOTE — Patient Instructions (Signed)
Bacterial Vaginosis  Bacterial vaginosis is an infection that occurs when the normal balance of bacteria in the vagina changes. This change is caused by an overgrowth of certain bacteria in the vagina. Bacterial vaginosis is the most common vaginal infection among females aged 85 to 44 years. This condition increases the risk of sexually transmitted infections (STIs). Treatment can help reduce this risk. Treatment is very important for pregnant women because this condition can cause babies to be born early (prematurely) or at a low birth weight. What are the causes? This condition is caused by an increase in harmful bacteria that are normally present in small amounts in the vagina. However, the exact reason this condition develops is not known. You cannot get bacterial vaginosis from toilet seats, bedding, swimming pools, or contact with objects around you. What increases the risk? The following factors may make you more likely to develop this condition: Having a new sexual partner or multiple sexual partners, or having unprotected sex. Douching. Having an intrauterine device (IUD). Smoking. Abusing drugs and alcohol. This may lead to riskier sexual behavior. Taking certain antibiotic medicines. Being pregnant. What are the signs or symptoms? Some women with this condition have no symptoms. Symptoms may include: Gray or white vaginal discharge. The discharge can be watery or foamy. A fish-like odor with discharge, especially after sex or during menstruation. Itching in and around the vagina. Burning or pain with urination. How is this diagnosed? This condition is diagnosed based on: Your medical history. A physical exam of the vagina. Checking a sample of vaginal fluid for harmful bacteria or abnormal cells. How is this treated? This condition is treated with antibiotic medicines. These may be given as a pill, a vaginal cream, or a medicine that is put into the vagina (suppository). If  the condition comes back after treatment, a second round of antibiotics may be needed. Follow these instructions at home: Medicines Take or apply over-the-counter and prescription medicines only as told by your health care provider. Take or apply your antibiotic medicine as told by your health care provider. Do not stop using the antibiotic even if you start to feel better. General instructions If you have a female sexual partner, tell her that you have a vaginal infection. She should follow up with her health care provider. If you have a female sexual partner, he does not need treatment. Avoid sexual activity until you finish treatment. Drink enough fluid to keep your urine pale yellow. Keep the area around your vagina and rectum clean. Wash the area daily with warm water. Wipe yourself from front to back after using the toilet. If you are breastfeeding, talk to your health care provider about continuing breastfeeding during treatment. Keep all follow-up visits. This is important. How is this prevented? Self-care Do not douche. Wash the outside of your vagina with warm water only. Wear cotton or cotton-lined underwear. Avoid wearing tight pants and pantyhose, especially during the summer. Safe sex Use protection when having sex. This includes: Using condoms. Using dental dams. This is a thin layer of a material made of latex or polyurethane that protects the mouth during oral sex. Limit the number of sexual partners. To help prevent bacterial vaginosis, it is best to have sex with just one partner (monogamous relationship). Make sure you and your sexual partner are tested for STIs. Drugs and alcohol Do not use any products that contain nicotine or tobacco. These products include cigarettes, chewing tobacco, and vaping devices, such as e-cigarettes. If you need help   quitting, ask your health care provider. Do not use drugs. Do not drink alcohol if: Your health care provider tells you not  to do this. You are pregnant, may be pregnant, or are planning to become pregnant. If you drink alcohol: Limit how much you have to 0-1 drink a day. Be aware of how much alcohol is in your drink. In the [place] one drink equals one 12 oz bottle of beer (355 mL), one 5 oz glass of wine (148 mL), or one 1 oz glass of hard liquor (44 mL). Where to find more information Centers for Disease Control and Prevention: http://www.wolf.info/ American Sexual Health Association (ASHA): www.ashastd.org U.S. Department of Health and Financial controller, Office on Women's Health: [place] Contact a health care provider if: Your symptoms do not improve, even after treatment. You have more discharge or pain when urinating. You have a fever or chills. You have pain in your abdomen or pelvis. You have pain during sex. You have vaginal bleeding between menstrual periods. Summary Bacterial vaginosis is a vaginal infection that occurs when the normal balance of bacteria in the vagina changes. It results from an overgrowth of certain bacteria. This condition increases the risk of sexually transmitted infections (STIs). Getting treated can help reduce this risk. Treatment is very important for pregnant women because this condition can cause babies to be born early (prematurely) or at low birth weight. This condition is treated with antibiotic medicines. These may be given as a pill, a vaginal cream, or a medicine that is put into the vagina (suppository). This information is not intended to replace advice given to you by your health care provider. Make sure you discuss any questions you have with your health care provider. Document Revised: 04/25/2020 Document Reviewed: 04/25/2020 Elsevier Patient Education  [place]. Urinary Tract Infection, Adult  A urinary tract infection (UTI) is an infection of any part of the urinary tract. The urinary tract includes the kidneys, ureters, bladder, and urethra. These  organs make, store, and get rid of urine in the body. An upper UTI affects the ureters and kidneys. A lower UTI affects the bladder and urethra. What are the causes? Most urinary tract infections are caused by bacteria in your genital area around your urethra, where urine leaves your body. These bacteria grow and cause inflammation of your urinary tract. What increases the risk? You are more likely to develop this condition if: You have a urinary catheter that stays in place. You are not able to control when you urinate or have a bowel movement (incontinence). You are female and you: Use a spermicide or diaphragm for birth control. Have low estrogen levels. Are pregnant. You have certain genes that increase your risk. You are sexually active. You take antibiotic medicines. You have a condition that causes your flow of urine to slow down, such as: An enlarged prostate, if you are female. Blockage in your urethra. A kidney stone. A nerve condition that affects your bladder control (neurogenic bladder). Not getting enough to drink, or not urinating often. You have certain medical conditions, such as: Diabetes. A weak disease-fighting system (immunesystem). Sickle cell disease. Gout. Spinal cord injury. What are the signs or symptoms? Symptoms of this condition include: Needing to urinate right away (urgency). Frequent urination. This may include small amounts of urine each time you urinate. Pain or burning with urination. Blood in the urine. Urine that smells bad or unusual. Trouble urinating. Cloudy urine. Vaginal discharge, if you are female. Pain in the abdomen  or the lower back. You may also have: Vomiting or a decreased appetite. Confusion. Irritability or tiredness. A fever or chills. Diarrhea. The first symptom in older adults may be confusion. In some cases, they may not have any symptoms until the infection has worsened. How is this diagnosed? This condition is  diagnosed based on your medical history and a physical exam. You may also have other tests, including: Urine tests. Blood tests. Tests for STIs (sexually transmitted infections). If you have had more than one UTI, a cystoscopy or imaging studies may be done to determine the cause of the infections. How is this treated? Treatment for this condition includes: Antibiotic medicine. Over-the-counter medicines to treat discomfort. Drinking enough water to stay hydrated. If you have frequent infections or have other conditions such as a kidney stone, you may need to see a health care provider who specializes in the urinary tract (urologist). In rare cases, urinary tract infections can cause sepsis. Sepsis is a life-threatening condition that occurs when the body responds to an infection. Sepsis is treated in the hospital with IV antibiotics, fluids, and other medicines. Follow these instructions at home:  Medicines Take over-the-counter and prescription medicines only as told by your health care provider. If you were prescribed an antibiotic medicine, take it as told by your health care provider. Do not stop using the antibiotic even if you start to feel better. General instructions Make sure you: Empty your bladder often and completely. Do not hold urine for long periods of time. Empty your bladder after sex. Wipe from front to back after urinating or having a bowel movement if you are female. Use each tissue only one time when you wipe. Drink enough fluid to keep your urine pale yellow. Keep all follow-up visits. This is important. Contact a health care provider if: Your symptoms do not get better after 1-2 days. Your symptoms go away and then return. Get help right away if: You have severe pain in your back or your lower abdomen. You have a fever or chills. You have nausea or vomiting. Summary A urinary tract infection (UTI) is an infection of any part of the urinary tract, which includes  the kidneys, ureters, bladder, and urethra. Most urinary tract infections are caused by bacteria in your genital area. Treatment for this condition often includes antibiotic medicines. If you were prescribed an antibiotic medicine, take it as told by your health care provider. Do not stop using the antibiotic even if you start to feel better. Keep all follow-up visits. This is important. This information is not intended to replace advice given to you by your health care provider. Make sure you discuss any questions you have with your health care provider. Document Revised: 06/07/2020 Document Reviewed: 06/07/2020 Elsevier Patient Education  [place].

## 2022-07-31 LAB — URINE CULTURE
MICRO NUMBER:: 13949849
SPECIMEN QUALITY:: ADEQUATE

## 2022-08-04 ENCOUNTER — Ambulatory Visit: Payer: Self-pay | Admitting: Licensed Clinical Social Worker

## 2022-08-04 NOTE — Patient Outreach (Signed)
  Care Coordination   Follow Up Visit Note   08/04/2022 Name: Emily Bentley MRN: [id number] DOB: 10-07-1935  ADAMARY SAVARY is a 87 y.o. year old female who sees [name], MD for primary care. I spoke with  [name] / and granddaugher of client, [name], by phone today.  What matters to the patients health and wellness today? Client wants to maintain current activity level. Client wants to complete daily ADLs. Client wants to manage anxiety issues    Goals Addressed             This Visit's Progress    Patient wants to maintain current actiities. Client wants to complete daily ADLs. Client wants to manage anxiety issues.       Care Coordination Interventions:  Active listening / Reflection utilized  Discussed client needs with client and with her granddaughter, [name] Discussed Care Coordination program support with [name], granddaughter of client Client is now taking Lexapro as prescribed. Client gets anxious occasionally. [name] is supportive. Client drives to most appointments. Client eats several small meals daily.  Client is sleeping well. Client has prescribed medications Reviewed mood of client. [name] said client is still having grief issues over death of her sister.  Reviewed ambulation needs of client. [name] said client is walking well.  Discussed hearing needs of client Encouraged client or granddaughter of client to call LCSW as needed for support at [phone].     SDOH assessments and interventions completed:  Yes    Care Coordination Interventions Activated:  Yes  Care Coordination Interventions:  Yes, provided   Follow up plan: Follow up call scheduled for 09/08/22 at 2:00 PM    Encounter Outcome:  Pt. Visit Completed

## 2022-08-04 NOTE — Patient Instructions (Addendum)
Visit Information  Thank you for taking time to visit with me today. Please don't hesitate to contact me if I can be of assistance to you before our next scheduled telephone appointment.  Following are the goals we discussed today:   Our next appointment is by telephone on 09/08/22 at 2:00 PM   Please call the care guide team at [phone] if you need to cancel or reschedule your appointment.   If you are experiencing a Mental Health or [place] or need someone to talk to, please go to [facility] [place] [phone])   Following is a copy of your full plan of care:   Care Coordination Interventions:  Active listening / Reflection utilized  Discussed client needs with client and with her granddaughter, [name] Discussed Care Coordination program support with [name], granddaughter of client Client is now taking Lexapro as prescribed. Client gets anxious occasionally. [name] is supportive. Client drives to most appointments. Client eats several small meals daily.  Client is sleeping well. Client has prescribed medications Reviewed mood of client. [name] said client is still having grief issues over death of her sister.  Reviewed ambulation needs of client. [name] said client is walking well.  Discussed hearing needs of client Encouraged client or granddaughter of client to call LCSW as needed for support at [phone].   Ms. Alcaide was given information about Care Management services by the embedded care coordination team including:  Care Management services include personalized support from designated clinical staff supervised by her physician, including individualized plan of care and coordination with other care providers 24/7 contact phone numbers for assistance for urgent and routine care needs. The patient may stop CCM services at any time (effective at the end of the month) by phone call to the office  staff.  Patient agreed to services and verbal consent obtained.   Norva Riffle.Lache Dagher MSW, [place] Holiday representative [facility] Care Management [phone]

## 2022-08-17 DIAGNOSIS — Z45321 Encounter for adjustment and management of cochlear device: Secondary | ICD-10-CM | POA: Diagnosis not present

## 2022-08-26 ENCOUNTER — Telehealth: Payer: Self-pay | Admitting: *Deleted

## 2022-08-26 NOTE — Telephone Encounter (Signed)
Cardiac CT scheduler has been unable to reach patient. Letter has been sent to patient by scheduler asking patient to call to schedule CT.  Patient called and reported she needed more time before scheduling test.

## 2022-09-08 ENCOUNTER — Ambulatory Visit: Payer: Self-pay | Admitting: Licensed Clinical Social Worker

## 2022-09-08 NOTE — Patient Outreach (Signed)
  Care Coordination   Follow Up Visit Note   09/08/2022 Name: Emily Bentley MRN: [id number] DOB: 16-Dec-1934  Emily Bentley is a 87 y.o. year old female who sees [name], MD for primary care. I spoke with  [name] by phone today.  What matters to the patients health and wellness today?  Client wants to live independently and receive support if needed     Goals Addressed             This Visit's Progress    Patient wants to live independently and receive support if needed       Interventions: Reviewed program support Discussed client needs. Client said she drives to appointments , drives to grocery store to receive needed food items, and is doing well with ADLs Discussed hearing needs of client Discussed client support from her granddaughter, [name] Reviewed sleeping needs of client     SDOH assessments and interventions completed:  Yes  SDOH Interventions Today    Flowsheet Row Most Recent Value  SDOH Interventions   Depression Interventions/Treatment  Medication, Counseling  Stress Interventions Provide Counseling  [client has some stress in managing medical needs]        Care Coordination Interventions Activated:  Yes  Care Coordination Interventions:  Yes, provided   Follow up plan: Follow up call scheduled for 09/29/22 at 10:00 AM    Encounter Outcome:  Pt. Visit Completed   Norva Riffle.Jeffre Enriques MSW, [place] Holiday representative [facility] Care Management [phone]

## 2022-09-08 NOTE — Patient Instructions (Addendum)
Visit Information  Thank you for taking time to visit with me today. Please don't hesitate to contact me if I can be of assistance to you before our next scheduled telephone appointment.  Following are the goals we discussed today:   Our next appointment is by telephone on 09/29/22 at 10:00 AM   Please call the care guide team at [phone] if you need to cancel or reschedule your appointment.   If you are experiencing a Mental Health or [place] or need someone to talk to, please go to [facility] [place] [phone])   Following is a copy of your full plan of care:   Interventions: Reviewed program support Discussed client needs. Client said she drives to appointments , drives to grocery store to receive needed food items, and is doing well with ADLs Discussed hearing needs of client Discussed client support from her granddaughter, [name] Reviewed sleeping needs of client  Ms. Wieland was given information about Care Management services by the embedded care coordination team including:  Care Management services include personalized support from designated clinical staff supervised by her physician, including individualized plan of care and coordination with other care providers 24/7 contact phone numbers for assistance for urgent and routine care needs. The patient may stop CCM services at any time (effective at the end of the month) by phone call to the office staff.  Patient agreed to services and verbal consent obtained.   Norva Riffle.Lelar Farewell MSW, [place] Holiday representative [facility] Care Management [phone]

## 2022-09-09 ENCOUNTER — Telehealth (HOSPITAL_COMMUNITY): Payer: Self-pay | Admitting: *Deleted

## 2022-09-09 NOTE — Telephone Encounter (Signed)
Reaching out to patient to offer assistance regarding upcoming cardiac imaging study; pt's granddaughter verbalizes understanding of appt date/time, parking situation and where to check in, medications ordered, and verified current allergies; name and call back number provided for further questions should they arise  Gordy Clement RN Navigator Cardiac Imaging [facility] [phone] office [phone] cell   Patient to take '50mg'$  metoprolol tartrate two hours prior to her cardiac CT scan. Her granddaughter is aware the patient is to arrive at 3:30pm.

## 2022-09-10 ENCOUNTER — Ambulatory Visit (HOSPITAL_COMMUNITY)
Admission: RE | Admit: 2022-09-10 | Discharge: 2022-09-10 | Disposition: A | Discharge status: Home / Self Care | Payer: Medicare Other | Source: Ambulatory Visit | Attending: Interventional Cardiology | Admitting: Interventional Cardiology

## 2022-09-10 DIAGNOSIS — I700 Atherosclerosis of aorta: Secondary | ICD-10-CM

## 2022-09-10 DIAGNOSIS — R072 Precordial pain: Secondary | ICD-10-CM | POA: Diagnosis not present

## 2022-09-10 MED ORDER — IOHEXOL 350 MG/ML SOLN
100.0000 mL | Freq: Once | INTRAVENOUS | Status: AC | PRN
  Administered 2022-09-10: 100 mL via INTRAVENOUS

## 2022-09-10 MED ORDER — NITROGLYCERIN 0.4 MG SL SUBL
0.8000 mg | SUBLINGUAL_TABLET | Freq: Once | SUBLINGUAL | Status: AC
  Administered 2022-09-10: 0.8 mg via SUBLINGUAL

## 2022-09-10 MED ORDER — NITROGLYCERIN 0.4 MG SL SUBL
SUBLINGUAL_TABLET | SUBLINGUAL | Status: AC
  Filled 2022-09-10: qty 2

## 2022-09-15 ENCOUNTER — Telehealth: Payer: Self-pay | Admitting: *Deleted

## 2022-09-15 DIAGNOSIS — Z79899 Other long term (current) drug therapy: Secondary | ICD-10-CM

## 2022-09-15 DIAGNOSIS — I700 Atherosclerosis of aorta: Secondary | ICD-10-CM

## 2022-09-15 MED ORDER — ROSUVASTATIN CALCIUM 10 MG PO TABS: 10.0000 mg | ORAL_TABLET | Freq: Every day | ORAL | 3 refills | Status: AC

## 2022-09-15 NOTE — Telephone Encounter (Signed)
No number listed in chart for patient.  I called number on DPR for patient.  Patient requests I call her grand daughter([name]) to discuss results.  I spoke with [name] and reviewed results with her.  Prescription sent to [facility] on [address].  Patient will come in for fasting lab work on 12/16/22.  [name] reports patient does not snore, feels rested most days, sleeps well, does not dose off during conversations. No apnea events noted during sleep. Patient will follow up with PCP regarding noncalcified pulmonary nodule to determine if follow up needed.   Will plan one year follow up with Dr Irish Lack unless he feels patient needs to be seen sooner.

## 2022-09-15 NOTE — Telephone Encounter (Signed)
-----   Message from Jettie Booze, MD sent at 09/15/2022  1:09 PM EST ----- Increased calcium score.  Nonobstructive CAD.  Possible pulm HTN- would ask about symptoms of sleep apnea. Would add rosuvastatin 10 mg daily.  Liver and lipids in 3 months.

## 2022-09-17 NOTE — Telephone Encounter (Signed)
OK for 1 year f/u

## 2022-09-29 ENCOUNTER — Ambulatory Visit: Payer: Self-pay | Admitting: Licensed Clinical Social Worker

## 2022-09-29 NOTE — Patient Outreach (Signed)
  Care Coordination   09/29/2022 Name: Emily Bentley MRN: [id number] DOB: 01/23/1935   Care Coordination Outreach Attempts:  An unsuccessful telephone outreach was attempted today to offer the patient information about available care coordination services as a benefit of their health plan.   Follow Up Plan:  Additional outreach attempts will be made to offer the patient care coordination information and services.   Encounter Outcome:  No Answer  Care Coordination Interventions Activated:  No   Care Coordination Interventions:  No, not indicated    Norva Riffle.Altheia Shafran MSW, [place] Holiday representative [facility] Care Management [phone]

## 2022-10-20 ENCOUNTER — Ambulatory Visit: Payer: Self-pay | Admitting: Licensed Clinical Social Worker

## 2022-10-20 ENCOUNTER — Encounter: Payer: Self-pay | Admitting: Internal Medicine

## 2022-10-20 NOTE — Patient Instructions (Signed)
Visit Information  Thank you for taking time to visit with me today. Please don't hesitate to contact me if I can be of assistance to you before our next scheduled telephone appointment.  Following are the goals we discussed today:   Our next appointment is by telephone on 11/30/22 at 10:00 AM   Please call the care guide team at [phone] if you need to cancel or reschedule your appointment.   If you are experiencing a Mental Health or [place] or need someone to talk to, please go to [facility] [place] [phone])   Following is a copy of your full plan of care:   Interventions:  Spoke with client today via phone about her current needs Spoke about hearing needs of client Discussed support client receives from granddaughter, [name] Discussed transport needs. Client said she usually drives herself to her scheduled appointments. She drives to grocery store to obtain food items.   Reviewed ADLs completion for client.  Reviewed sleeping issues of client. She said she is sleeping well. She said she has her prescribed medications.  Reviewed mood of client. She said she does get anxious occasionally. She likes to watch TV to help her relax. Discussed support of PCP, Dr. Pricilla Holm Informed client of RN support through program. Informed client of Pharmacy support in program.  Client did not feel that she needed RN or Pharmacist to call her at this time Encouraged client to call LCSW as needed at [phone] to discuss SW needs of client  Ms. Goosby was given information about Care Management services by the embedded care coordination team including:  Care Management services include personalized support from designated clinical staff supervised by her physician, including individualized plan of care and coordination with other care providers 24/7 contact phone numbers for assistance for urgent and  routine care needs. The patient may stop CCM services at any time (effective at the end of the month) by phone call to the office staff.  Patient agreed to services and verbal consent obtained.   Norva Riffle.Micheala Morissette MSW, [place] Holiday representative [facility] Care Management [phone]

## 2022-10-20 NOTE — Patient Outreach (Signed)
  Care Coordination   Follow Up Visit Note   10/20/2022 Name: Emily WOHLFARTH MRN: [id number] DOB: December 19, 1934  Emily Bentley is a 87 y.o. year old female who sees [name], MD for primary care. I spoke with  [name] by phone today.  What matters to the patients health and wellness today? Wants to live independently and receive support if needed    Goals Addressed             This Visit's Progress    Patient wants to live independently and receive support if needed       Interventions:  Spoke with client today via phone about her current needs Spoke about hearing needs of client Discussed support client receives from granddaughter, [name] Discussed transport needs. Client said she usually drives herself to her scheduled appointments. She drives to grocery store to obtain food items.   Reviewed ADLs completion for client.  Reviewed sleeping issues of client. She said she is sleeping well. She said she has her prescribed medications.  Reviewed mood of client. She said she does get anxious occasionally. She likes to watch TV to help her relax. Discussed support of PCP, Dr. Pricilla Holm Informed client of RN support through program. Informed client of Pharmacy support in program.  Client did not feel that she needed RN or Pharmacist to call her at this time Encouraged client to call LCSW as needed at [phone] to discuss SW needs of client     SDOH assessments and interventions completed:  Yes  SDOH Interventions Today    Flowsheet Row Most Recent Value  SDOH Interventions   Depression Interventions/Treatment  Counseling  Stress Interventions Provide Counseling  [client has some stress in managing medical needs. Has anxiety issues]        Care Coordination Interventions:  Yes, provided   Follow up plan: Follow up call scheduled for 11/30/22 at 10:00 AM    Encounter Outcome:  Pt. Visit Completed   Norva Riffle.Yavier Snider MSW,  [place] Holiday representative [facility] Care Management [phone]

## 2022-10-21 ENCOUNTER — Other Ambulatory Visit: Payer: Self-pay

## 2022-10-21 DIAGNOSIS — Z961 Presence of intraocular lens: Secondary | ICD-10-CM | POA: Diagnosis not present

## 2022-10-21 DIAGNOSIS — H26491 Other secondary cataract, right eye: Secondary | ICD-10-CM | POA: Diagnosis not present

## 2022-10-21 DIAGNOSIS — H353131 Nonexudative age-related macular degeneration, bilateral, early dry stage: Secondary | ICD-10-CM | POA: Diagnosis not present

## 2022-10-21 MED ORDER — ESCITALOPRAM OXALATE 10 MG PO TABS: 10.0000 mg | ORAL_TABLET | Freq: Every day | ORAL | 1 refills | Status: DC

## 2022-11-19 ENCOUNTER — Ambulatory Visit (INDEPENDENT_AMBULATORY_CARE_PROVIDER_SITE_OTHER): Payer: Medicare Other | Admitting: Internal Medicine

## 2022-11-19 ENCOUNTER — Encounter: Payer: Self-pay | Admitting: Internal Medicine

## 2022-11-19 VITALS — BP 160/84 | HR 95 | Temp 98.7°F | Ht 62.5 in | Wt 159.0 lb

## 2022-11-19 DIAGNOSIS — R21 Rash and other nonspecific skin eruption: Secondary | ICD-10-CM | POA: Diagnosis not present

## 2022-11-19 DIAGNOSIS — Z23 Encounter for immunization: Secondary | ICD-10-CM | POA: Diagnosis not present

## 2022-11-19 MED ORDER — CLOTRIMAZOLE-BETAMETHASONE 1-0.05 % EX CREA: 1.0000 | TOPICAL_CREAM | Freq: Every day | CUTANEOUS | 0 refills | Status: DC

## 2022-11-19 NOTE — Assessment & Plan Note (Signed)
Rx clotrimazole/betamethasone to help with likely candidal rash.

## 2022-11-19 NOTE — Progress Notes (Signed)
   Subjective:   Patient ID: Emily Bentley, female    DOB: 10/13/1935, 88 y.o.   MRN: [id number]  HPI The patient is an 88 YO female coming in for rash on stomach.  Review of Systems  Constitutional: Negative.   HENT: Negative.    Eyes: Negative.   Respiratory:  Negative for cough, chest tightness and shortness of breath.   Cardiovascular:  Negative for chest pain, palpitations and leg swelling.  Gastrointestinal:  Negative for abdominal distention, abdominal pain, constipation, diarrhea, nausea and vomiting.  Musculoskeletal: Negative.   Skin:  Positive for rash.  Neurological: Negative.   Psychiatric/Behavioral: Negative.      Objective:  Physical Exam Constitutional:      Appearance: She is well-developed.  HENT:     Head: Normocephalic and atraumatic.  Cardiovascular:     Rate and Rhythm: Normal rate and regular rhythm.  Pulmonary:     Effort: Pulmonary effort is normal. No respiratory distress.     Breath sounds: Normal breath sounds. No wheezing or rales.  Abdominal:     General: Bowel sounds are normal. There is no distension.     Palpations: Abdomen is soft.     Tenderness: There is no abdominal tenderness. There is no rebound.  Musculoskeletal:     Cervical back: Normal range of motion.  Skin:    General: Skin is warm and dry.     Findings: Rash present.     Comments: Rash on stomach consistent with candidal  Neurological:     Mental Status: She is alert and oriented to person, place, and time.     Coordination: Coordination normal.     Vitals:   11/19/22 0957 11/19/22 1002  BP: (!) 160/84 (!) 160/84  Pulse: 95   Temp: 98.7 F (37.1 C)   TempSrc: Oral   SpO2: 96%   Weight: 159 lb (72.1 kg)   Height: 5' 2.5" (1.588 m)     Assessment & Plan:  Flu shot given at visit

## 2022-11-19 NOTE — Patient Instructions (Addendum)
We have sent in cream to use daily to help.  Try adding colace to help you go to the bathroom easier.

## 2022-11-30 ENCOUNTER — Ambulatory Visit: Payer: Self-pay | Admitting: Licensed Clinical Social Worker

## 2022-11-30 NOTE — Patient Instructions (Signed)
Visit Information  Thank you for taking time to visit with me today. Please don't hesitate to contact me if I can be of assistance to you before our next scheduled telephone appointment.  Following are the goals we discussed today:   Our next appointment is by telephone on 01/04/23 at 10:00 AM  Please call the care guide team at [phone] if you need to cancel or reschedule your appointment.   If you are experiencing a Mental Health or [place] or need someone to talk to, please go to [facility] [place] [phone])   Following is a copy of your full plan of care:   Care Coordination Interventions:  Active listening / Reflection utilized  Discussed client needs with client  Discussed Care Coordination program support with client  Client gets anxious occasionally. [name] is supportive. Client drives to most appointments. Client eats several small meals daily.  Client is sleeping well. Client has prescribed medications Reviewed mood of client.Client said her mood was stable. She felt her mood was doing well. She is taking medications as prescribed.  Discussed hearing needs of client. Client uses hearing aid to help her hear adequately Discussed pain issues of client Provided counseling support for client Discussed RN support and Pharmacy support with program. Reviewed appetite of client. Client said she has a friend who checks on her several times a day to ensure client is eating adequately.  Client said she cooks some of her own meals. Encouraged client to call LCSW as needed for support at [phone].    Ms. Coscia was given information about Care Management services by the embedded care coordination team including:  Care Management services include personalized support from designated clinical staff supervised by her physician, including individualized plan of care and coordination with other care  providers 24/7 contact phone numbers for assistance for urgent and routine care needs. The patient may stop CCM services at any time (effective at the end of the month) by phone call to the office staff.  Patient agreed to services and verbal consent obtained.   Norva Riffle.Daymion Nazaire MSW, [place] Holiday representative [facility] Care Management [phone]

## 2022-11-30 NOTE — Patient Outreach (Signed)
  Care Coordination   Follow Up Visit Note   11/30/2022 Name: Emily Bentley MRN: [id number] DOB: Sep 04, 1935  Emily Bentley is a 88 y.o. year old female who sees Emily Koch, MD for primary care. I spoke with  Emily Bentley by phone today.  What matters to the patients health and wellness today? Patient wants to maintain current activity level. Patient wants to complete ADLs daily. Client is trying to manage anxiety issues of client    Goals Addressed             This Visit's Progress    Patient wants to maintain current actiities. Client wants to complete daily ADLs. Client wants to manage anxiety issues.       Care Coordination Interventions:  Active listening / Reflection utilized  Discussed client needs with client  Discussed Care Coordination program support with client  Client gets anxious occasionally. Emily Bentley is supportive. Client drives to most appointments. Client eats several small meals daily.  Client is sleeping well. Client has prescribed medications Reviewed mood of client.Client said her mood was stable. She felt her mood was doing well. She is taking medications as prescribed.  Discussed hearing needs of client. Client uses hearing aid to help her hear adequately Discussed pain issues of client Provided counseling support for client Discussed RN support and Pharmacy support with program. Reviewed appetite of client. Client said she has a friend who checks on her several times a day to ensure client is eating adequately.  Client said she cooks some of her own meals. Encouraged client to call LCSW as needed for support at [phone].       SDOH assessments and interventions completed:  Yes  SDOH Interventions Today    Flowsheet Row Most Recent Value  SDOH Interventions   Depression Interventions/Treatment  Counseling  Stress Interventions Provide Counseling  [client has anxiety issues occasionally]        Care Coordination Interventions:   Yes, provided   Follow up plan: Follow up call scheduled for 01/04/23 at 10:00 AM    Encounter Outcome:  Pt. Visit Completed   Emily Bentley.Emily Bentley MSW, [place] Holiday representative [facility] Care Management [phone]

## 2022-12-16 ENCOUNTER — Other Ambulatory Visit: Payer: Medicare Other

## 2022-12-22 ENCOUNTER — Ambulatory Visit: Payer: Medicare Other | Attending: Interventional Cardiology

## 2022-12-22 DIAGNOSIS — Z79899 Other long term (current) drug therapy: Secondary | ICD-10-CM

## 2022-12-22 DIAGNOSIS — I700 Atherosclerosis of aorta: Secondary | ICD-10-CM | POA: Diagnosis not present

## 2022-12-22 LAB — HEPATIC FUNCTION PANEL
ALT: 12 IU/L (ref 0–32)
AST: 26 IU/L (ref 0–40)
Albumin: 4 g/dL (ref 3.7–4.7)
Alkaline Phosphatase: 73 IU/L (ref 44–121)
Bilirubin Total: 0.9 mg/dL (ref 0.0–1.2)
Bilirubin, Direct: 0.24 mg/dL (ref 0.00–0.40)
Total Protein: 6.9 g/dL (ref 6.0–8.5)

## 2022-12-22 LAB — LIPID PANEL
Chol/HDL Ratio: 1.9 ratio (ref 0.0–4.4)
Cholesterol, Total: 129 mg/dL (ref 100–199)
HDL: 67 mg/dL (ref >39)
LDL Chol Calc (NIH): 45 mg/dL (ref 0–99)
Triglycerides: 87 mg/dL (ref 0–149)
VLDL Cholesterol Cal: 17 mg/dL (ref 5–40)

## 2023-01-04 ENCOUNTER — Ambulatory Visit: Payer: Self-pay | Admitting: Licensed Clinical Social Worker

## 2023-01-04 NOTE — Patient Outreach (Signed)
  Care Coordination   Follow Up Visit Note   01/04/2023 Name: Emily Bentley MRN: [id number] DOB: [birth year]  Emily Bentley is a 88 y.o. year old female who sees Emily Koch, MD for primary care. I spoke with  Emily Bentley by phone today.  What matters to the patients health and wellness today? Patient wants to maintain activity level. Client wants to complete daily ADLs. Client wants to manage anxiety issues    Goals Addressed             This Visit's Progress    Patient wants to maintain current actiities. Client wants to complete daily ADLs. Client wants to manage anxiety issues.       Care Coordination Interventions:  LCSW spoke via phone today with client about client needs. Active listening / Reflection utilized  Discussed Care Coordination program support with client  Client said that her brother passed away recently; this is difficult for client to manage. She said her anxiety has been harder to manage since her brother passed away. She said she does have prescribed medications and is taking medications as prescribed Client drives to most appointments. Client eats several small meals daily.  Client is sleeping well.   Discussed hearing needs of client. Client uses hearing aid to help her hear adequately Discussed pain issues of client. She spoke of pain from arthritis. Provided counseling support for client Encouraged client to call LCSW as needed for support at [phone]. Discussed grief management. She said her family is coming in soon and will be present for funeral service for her brother. Discussed coping with grief of client; discussed her managing anxiety issues Client appreciative of call from LCSW today LCSW to call client on 02/03/23 at 11:00 AM       SDOH assessments and interventions completed:  Yes  SDOH Interventions Today    Flowsheet Row Most Recent Value  SDOH Interventions   Depression Interventions/Treatment  Medication,  Counseling  Stress Interventions Provide Counseling  [client has stress since her brother recently passed away. client also has stress in managing her medical needs]        Care Coordination Interventions:  Yes, provided   Interventions Today    Flowsheet Row Most Recent Value  Chronic Disease   Chronic disease during today's visit Other  [discussion of grief issues faced]  General Interventions   General Interventions Discussed/Reviewed General Interventions Discussed  [discussed program support. Discussed her managing anxiety issues and grief issues]  Mental Health Interventions   Mental Health Discussed/Reviewed Anxiety, Coping Strategies  [discussed family support at this time. Discussed her managing grief issues]  Nutrition Interventions   Nutrition Discussed/Reviewed Nutrition Discussed  [she eats several small meal daily]        Follow up plan: Follow up call scheduled for 02/03/23 at 11:00 AM    Encounter Outcome:  Pt. Visit Completed   Norva Riffle.Noraa Pickeral MSW, [place] Holiday representative [facility] Care Management [phone]

## 2023-01-04 NOTE — Patient Instructions (Signed)
Visit Information  Thank you for taking time to visit with me today. Please don't hesitate to contact me if I can be of assistance to you before our next scheduled telephone appointment.  Following are the goals we discussed today:    Our next appointment is by telephone on 02/03/23 at 11:00 AM   Please call the care guide team at [phone] if you need to cancel or reschedule your appointment.   If you are experiencing a Mental Health or [place] or need someone to talk to, please go to [facility] [place] [phone])   Following is a copy of your full plan of care:   Care Coordination Interventions:  LCSW spoke via phone today with client about client needs. Active listening / Reflection utilized  Discussed Care Coordination program support with client  Client said that her brother passed away recently; this is difficult for client to manage. She said her anxiety has been harder to manage since her brother passed away. She said she does have prescribed medications and is taking medications as prescribed Client drives to most appointments. Client eats several small meals daily.  Client is sleeping well.   Discussed hearing needs of client. Client uses hearing aid to help her hear adequately Discussed pain issues of client. She spoke of pain from arthritis. Provided counseling support for client Encouraged client to call LCSW as needed for support at [phone]. Discussed grief management. She said her family is coming in soon and will be present for funeral service for her brother. Discussed coping with grief of client; discussed her managing anxiety issues Client appreciative of call from LCSW today LCSW to call client on 02/03/23 at 11:00 AM  Ms. Clure was given information about Care Management services by the embedded care coordination team including:  Care Management services include personalized  support from designated clinical staff supervised by her physician, including individualized plan of care and coordination with other care providers 24/7 contact phone numbers for assistance for urgent and routine care needs. The patient may stop CCM services at any time (effective at the end of the month) by phone call to the office staff.  Patient agreed to services and verbal consent obtained.   Norva Riffle.Daylee Delahoz MSW, [place] Holiday representative [facility] Care Management [phone]

## 2023-02-03 ENCOUNTER — Ambulatory Visit: Payer: Self-pay | Admitting: Licensed Clinical Social Worker

## 2023-02-03 NOTE — Patient Instructions (Signed)
Visit Information  Thank you for taking time to visit with me today. Please don't hesitate to contact me if I can be of assistance to you.   Following are the goals we discussed today:   Goals Addressed             This Visit's Progress    Patient has hearing loss issues; uses a hearing aid. She said she is still grieving over recent death of her brother       Interventions: LCSW spoke via phone with client today about her needs Discussed grief issues of client. She has grief over recent death of her brother. She is talking with family and friends and this helps. She has support from granddaughter, [name]. She is trying to involve in activities to help her relax Provided counseling support Reviewed transport needs. Reviewed medication procurement. Reviewed sleeping issues Reminded client of program support with RN, Pharmacist and LCSW Discussed mood of client. She said that support of her granddaughter, [name], is very helpful to client Encouraged client to call LCSW as needed for SW needs at [phone]       Our next appointment is by telephone on 03/23/23 at 2:00 PM   Please call the care guide team at [phone] if you need to cancel or reschedule your appointment.   If you are experiencing a Mental Health or [place] or need someone to talk to, please go to [facility] [place] [phone])   The patient verbalized understanding of instructions, educational materials, and care plan provided today and DECLINED offer to receive copy of patient instructions, educational materials, and care plan.   The patient has been provided with contact information for the care management team and has been advised to call with any health related questions or concerns.   Norva Riffle.Roczen Waymire MSW, [place] Holiday representative [facility] Care Management [phone]

## 2023-02-03 NOTE — Patient Outreach (Signed)
  Care Coordination   Follow Up Visit Note   02/03/2023 Name: Emily Bentley MRN: [id number] DOB: 01-04-1935  Emily Bentley is a 88 y.o. year old female who sees [name], MD for primary care. I spoke with  [name] by phone today.  What matters to the patients health and wellness today? Client has hearing loss; she is still grieving over  recent death of her brother    Goals Addressed             This Visit's Progress    Patient has hearing loss issues; uses a hearing aid. She said she is still grieving over recent death of her brother       Interventions: LCSW spoke via phone with client today about her needs Discussed grief issues of client. She has grief over recent death of her brother. She is talking with family and friends and this helps. She has support from granddaughter, Emily Bentley. She is trying to involve in activities to help her relax Provided counseling support Reviewed transport needs. Reviewed medication procurement. Reviewed sleeping issues Reminded client of program support with RN, Pharmacist and LCSW Discussed mood of client. She said that support of her granddaughter, Emily Bentley, is very helpful to client Encouraged client to call LCSW as needed for SW needs at [phone]        SDOH assessments and interventions completed:  Yes  SDOH Interventions Today    Flowsheet Row Most Recent Value  SDOH Interventions   Depression Interventions/Treatment  Counseling  Stress Interventions Provide Counseling  [some stress in managing medical needs]        Care Coordination Interventions:  Yes, provided   Interventions Today    Flowsheet Row Most Recent Value  Chronic Disease   Chronic disease during today's visit Other  [talked with client about her needs]  General Interventions   General Interventions Discussed/Reviewed General Interventions Discussed, [employer]  [discussed program support]  Exercise Interventions   Exercise  Discussed/Reviewed Physical Activity  [employer] ok]  Education Interventions   Education Provided Provided Education  Provided Verbal Education On Mental Health/Coping with Illness  Mental Health Interventions   Mental Health Discussed/Reviewed Anxiety, Coping Strategies  [discussed relaxation techniques: likes bowling, music, uses IPAD , likes to do art drawing ,reads]  Safety Interventions   Safety Discussed/Reviewed Fall Risk        Follow up plan: Follow up call scheduled for 03/23/23 at 2:00 PM     Encounter Outcome:  Pt. Visit Completed   Norva Riffle.Jeriah Corkum MSW, [place] Holiday representative [facility] Care Management [phone]

## 2023-03-23 ENCOUNTER — Ambulatory Visit: Payer: Self-pay | Admitting: Licensed Clinical Social Worker

## 2023-03-23 ENCOUNTER — Encounter: Payer: Medicare Other | Admitting: Licensed Clinical Social Worker

## 2023-03-23 NOTE — Patient Outreach (Signed)
  Care Coordination   Follow Up Visit Note   03/23/2023 Name: Emily Bentley MRN: [id number] DOB: Jun 04, 1935  Emily Bentley is a 88 y.o. year old female who sees Myrlene Broker, MD for primary care. I spoke with  [name] by phone today.  What matters to the patients health and wellness today? Patient has hearing loss issues  She is having grief issues related to death of her brother in 01/16/23    Goals Addressed             This Visit's Progress    Patient has hearing loss issues; uses a hearing aid. She said she is still grieving over recent death of her brother       Interventions:  Spoke via phone today with client about client needs Spoke with client about grief issues related to death of her brother in Jan 16, 2023 Provided counseling support to client regarding loss of several of her family members Regarding transportation, client drives her car short distances as needed Reminded client of program support with RN, Pharmacist and LCSW Discussed mood of client.  Jesikah feels that her mood is stable  She has support from her granddaughter and from other family members Client said she has 3 brothers living.   Discussed client involvement in activities Encouraged client to call LCSW as needed for SW needs at [phone]        SDOH assessments and interventions completed:  Yes  SDOH Interventions Today    Flowsheet Row Most Recent Value  SDOH Interventions   Depression Interventions/Treatment  Counseling  Stress Interventions Provide Counseling  [some stress occasionally related to health needs ,  has a cane to use to help her walk]        Care Coordination Interventions:  Yes, provided   Interventions Today    Flowsheet Row Most Recent Value  Chronic Disease   Chronic disease during today's visit Other  [talked with client about her needs]  General Interventions   General Interventions Discussed/Reviewed General Interventions  Discussed, [employer]  [discussed program support]  Exercise Interventions   Exercise Discussed/Reviewed Physical Activity  Education Interventions   Education Provided Provided Education  Provided Verbal Education On [employer]  Mental Health Interventions   Mental Health Discussed/Reviewed Anxiety, Coping Strategies  [client feels that her mood is stable]  Nutrition Interventions   Nutrition Discussed/Reviewed Nutrition Discussed  Pharmacy Interventions   Pharmacy Dicussed/Reviewed Pharmacy Topics Discussed        Follow up plan: Follow up call scheduled for 05/05/23 at 9:30 AM     Encounter Outcome:  Pt. Visit Completed   Kelton Pillar.Maryiah Olvey MSW, LCSW Licensed Visual merchandiser [facility] Care Management [phone]

## 2023-03-23 NOTE — Patient Instructions (Signed)
Visit Information  Thank you for taking time to visit with me today. Please don't hesitate to contact me if I can be of assistance to you.   Following are the goals we discussed today:   Goals Addressed             This Visit's Progress    Patient has hearing loss issues; uses a hearing aid. She said she is still grieving over recent death of her brother       Interventions:  Spoke via phone today with client about client needs Spoke with client about grief issues related to death of her brother in 2023-01-16 Provided counseling support to client regarding loss of several of her family members Regarding transportation, client drives her car short distances as needed Reminded client of program support with RN, Pharmacist and LCSW Discussed mood of client.  Emily Bentley feels that her mood is stable  She has support from her granddaughter and from other family members Client said she has 3 brothers living.   Discussed client involvement in activities Encouraged client to call LCSW as needed for SW needs at [phone]        Our next appointment is by telephone on 05/05/23 at 9:30 AM  Please call the care guide team at [phone] if you need to cancel or reschedule your appointment.   If you are experiencing a Mental Health or Behavioral Health Crisis or need someone to talk to, please go to [facility] [address] [place] [phone])   The patient verbalized understanding of instructions, educational materials, and care plan provided today and DECLINED offer to receive copy of patient instructions, educational materials, and care plan.   The patient has been provided with contact information for the care management team and has been advised to call with any health related questions or concerns.   Kelton Pillar.Anelle Parlow MSW, LCSW Licensed Visual merchandiser [facility] Care Management [phone]

## 2023-03-25 ENCOUNTER — Encounter: Payer: Self-pay | Admitting: Internal Medicine

## 2023-03-25 ENCOUNTER — Other Ambulatory Visit: Payer: Self-pay

## 2023-03-25 MED ORDER — ESCITALOPRAM OXALATE 10 MG PO TABS: 10.0000 mg | ORAL_TABLET | Freq: Every day | ORAL | 1 refills | Status: DC

## 2023-03-31 ENCOUNTER — Encounter: Payer: Self-pay | Admitting: Internal Medicine

## 2023-03-31 ENCOUNTER — Ambulatory Visit (INDEPENDENT_AMBULATORY_CARE_PROVIDER_SITE_OTHER): Payer: Medicare Other | Admitting: Internal Medicine

## 2023-03-31 VITALS — BP 162/100 | HR 89 | Temp 98.6°F | Ht 62.5 in | Wt 161.0 lb

## 2023-03-31 DIAGNOSIS — R030 Elevated blood-pressure reading, without diagnosis of hypertension: Secondary | ICD-10-CM | POA: Diagnosis not present

## 2023-03-31 DIAGNOSIS — F419 Anxiety disorder, unspecified: Secondary | ICD-10-CM

## 2023-03-31 DIAGNOSIS — H9203 Otalgia, bilateral: Secondary | ICD-10-CM

## 2023-03-31 DIAGNOSIS — J301 Allergic rhinitis due to pollen: Secondary | ICD-10-CM | POA: Diagnosis not present

## 2023-03-31 MED ORDER — AMOXICILLIN-POT CLAVULANATE 875-125 MG PO TABS: 1.0000 | ORAL_TABLET | Freq: Two times a day (BID) | ORAL | 0 refills | Status: AC

## 2023-03-31 MED ORDER — AZELASTINE HCL 0.1 % NA SOLN
2.0000 | Freq: Two times a day (BID) | NASAL | 0 refills | Status: AC
Start: 2023-03-31 — End: ?

## 2023-03-31 MED ORDER — HYDROXYZINE HCL 10 MG PO TABS: 5.0000 mg | ORAL_TABLET | Freq: Two times a day (BID) | ORAL | 0 refills | Status: AC | PRN

## 2023-03-31 MED ORDER — FLUTICASONE PROPIONATE 50 MCG/ACT NA SUSP: 2.0000 | Freq: Every day | NASAL | 3 refills | Status: AC

## 2023-03-31 NOTE — Assessment & Plan Note (Signed)
Worsening and refilled azelastine and flonase. We are treating for infection currently as well and she will let us know if not improving.

## 2023-03-31 NOTE — Patient Instructions (Addendum)
Try zyrtec-d instead of claritin-d for allergies.   Keep taking the whole pill of the escitalopram (lexapro) for anxiety to help.  We have sent in hydroxyzine to use for anxiety and you can take 1/2 to 1 pill up to twice a day.  We have sent in augmentin to take 1 pill twice a day for 1 week for the sinuses.

## 2023-03-31 NOTE — Assessment & Plan Note (Signed)
Taking claritin-d lately and asked to switch to medicine without decongestant. Previous reading was normal off otc meds.

## 2023-03-31 NOTE — Assessment & Plan Note (Signed)
Worsening with taking 5 mg lexapro daily. She did increase to 10 mg daily and not enough time to see improvement. Discussed 2-3 weeks minimum for improvement and encouraged to keep taking lexapro 10 mg daily. Adding hydroxyzine 5-10 mg BID prn for anxiety or panic episodes. She is working on finding a Veterinary surgeon and has not found one yet but wishes to.

## 2023-03-31 NOTE — Progress Notes (Signed)
   Subjective:   Patient ID: Emily Bentley, female    DOB: [birth year], 88 y.o.   MRN: [id number]  HPI The patient is an 88 YO female coming in for ongoing dizziness and anxiety. Was taking 1/2 pill lexapro and in last week increased back to 1 pill daily.   Review of Systems  Constitutional:  Positive for appetite change.  HENT:  Positive for congestion and hearing loss.   Eyes: Negative.   Respiratory:  Negative for cough, chest tightness and shortness of breath.   Cardiovascular:  Negative for chest pain, palpitations and leg swelling.  Gastrointestinal:  Negative for abdominal distention, abdominal pain, constipation, diarrhea, nausea and vomiting.  Musculoskeletal: Negative.   Skin: Negative.   Neurological:  Positive for dizziness and headaches.  Psychiatric/Behavioral:  Positive for dysphoric mood. The patient is nervous/anxious.     Objective:  Physical Exam Constitutional:      Appearance: She is well-developed.  HENT:     Head: Normocephalic and atraumatic.     Ears:     Comments: Bilateral ear canals with redness and TM bulging, bilateral cochlear implants Cardiovascular:     Rate and Rhythm: Normal rate and regular rhythm.  Pulmonary:     Effort: Pulmonary effort is normal. No respiratory distress.     Breath sounds: Normal breath sounds. No wheezing or rales.  Abdominal:     General: Bowel sounds are normal. There is no distension.     Palpations: Abdomen is soft.     Tenderness: There is no abdominal tenderness. There is no rebound.  Musculoskeletal:     Cervical back: Normal range of motion.  Skin:    General: Skin is warm and dry.  Neurological:     Mental Status: She is alert and oriented to person, place, and time.     Coordination: Coordination normal.     Vitals:   03/31/23 0858 03/31/23 0902  BP: (!) 162/100 (!) 162/100  Pulse: 89   Temp: 98.6 F (37 C)   TempSrc: Oral   SpO2: 99%   Weight: 161 lb (73 kg)   Height: 5' 2.5" (1.588 m)      Assessment & Plan:

## 2023-03-31 NOTE — Assessment & Plan Note (Signed)
With signs of infection and given cochlear implant needs early treatment. Rx augmentin 1 week supply. She is having worsening sinus symptoms, more tinnitus, pressure headaches for weeks now.

## 2023-04-05 ENCOUNTER — Other Ambulatory Visit (HOSPITAL_COMMUNITY): Payer: Self-pay

## 2023-04-05 ENCOUNTER — Telehealth: Payer: Self-pay

## 2023-04-05 NOTE — Telephone Encounter (Signed)
Patient Advocate Encounters  Prior authorization for hydrOXYzine HCl 10MG  tablets submitted and APPROVED through [employer].  Test billing returns $0.29 copay for 30 day supply.  Key Ronita Hipps Effective: 04-05-2023 - 04-04-2024

## 2023-04-06 NOTE — Telephone Encounter (Signed)
Called patient and informed that her prescription has been approved

## 2023-04-28 DIAGNOSIS — H353131 Nonexudative age-related macular degeneration, bilateral, early dry stage: Secondary | ICD-10-CM | POA: Diagnosis not present

## 2023-04-30 ENCOUNTER — Encounter: Payer: Self-pay | Admitting: Internal Medicine

## 2023-05-03 ENCOUNTER — Ambulatory Visit (INDEPENDENT_AMBULATORY_CARE_PROVIDER_SITE_OTHER): Payer: Medicare Other

## 2023-05-03 VITALS — BP 122/60 | HR 65 | Temp 98.4°F | Resp 16 | Ht 62.5 in | Wt 168.0 lb

## 2023-05-03 DIAGNOSIS — Z0000 Encounter for general adult medical examination without abnormal findings: Secondary | ICD-10-CM

## 2023-05-03 MED ORDER — TRAMADOL HCL 50 MG PO TABS: 25.0000 mg | ORAL_TABLET | Freq: Two times a day (BID) | ORAL | 3 refills | Status: DC | PRN

## 2023-05-03 NOTE — Patient Instructions (Addendum)
Emily Bentley , Thank you for taking time to come for your Medicare Wellness Visit. I appreciate your ongoing commitment to your health goals. Please review the following plan we discussed and let me know if I can assist you in the future.   These are the goals we discussed:  Goals       Patient Stated she feels depressed and would like to talk about counseling support (pt-stated)      Care Coordination Interventions:   Depression screen reviewed  PHQ 2/9 completed: GAD 7 completed Active Listening Used Emotional Support Provided. Provided some counseling support related to client needs Spoke of Care Coordination Support Program services Discussed counseling resources       Patient wants to live independently and receive support if needed      Interventions:  Spoke with client today via phone about her current needs Spoke about hearing needs of client Discussed support client receives from granddaughter, Leverne Humbles Discussed transport needs. Client said she usually drives herself to her scheduled appointments. She drives to grocery store to obtain food items.   Reviewed ADLs completion for client.  Reviewed sleeping issues of client. She said she is sleeping well. She said she has her prescribed medications.  Reviewed mood of client. She said she does get anxious occasionally. She likes to watch TV to help her relax. Discussed support of PCP, Dr. Hillard Danker Informed client of RN support through program. Informed client of Pharmacy support in program.  Client did not feel that she needed RN or Pharmacist to call her at this time Encouraged client to call LCSW as needed at [phone] to discuss SW needs of client      Patient wants to maintain current actiities. Client wants to complete daily ADLs. Client wants to manage anxiety issues.      Care Coordination Interventions:  LCSW spoke via phone today with client about client needs. Active listening / Reflection utilized   Discussed Care Coordination program support with client  Client said that her brother passed away recently; this is difficult for client to manage. She said her anxiety has been harder to manage since her brother passed away. She said she does have prescribed medications and is taking medications as prescribed Client drives to most appointments. Client eats several small meals daily.  Client is sleeping well.   Discussed hearing needs of client. Client uses hearing aid to help her hear adequately Discussed pain issues of client. She spoke of pain from arthritis. Provided counseling support for client Encouraged client to call LCSW as needed for support at [phone]. Discussed grief management. She said her family is coming in soon and will be present for funeral service for her brother. Discussed coping with grief of client; discussed her managing anxiety issues Client appreciative of call from LCSW today LCSW to call client on 02/03/23 at 11:00 AM        This is a list of the screening recommended for you and due dates:  Health Maintenance  Topic Date Due   COVID-19 Vaccine (6 - 2023-24 season) 07/10/2022   Mammogram  12/23/2022   Flu Shot  06/10/2023   Medicare Annual Wellness Visit  05/02/2024   DTaP/Tdap/Td vaccine (3 - Td or Tdap) 02/04/2028   Pneumonia Vaccine  Completed   DEXA scan (bone density measurement)  Completed   Zoster (Shingles) Vaccine  Completed   HPV Vaccine  Aged Out    Advanced directives: YES  Conditions/risks identified: YES  Next appointment: Follow  up in one year for your annual wellness visit.   Preventive Care 58 Years and Older, Female Preventive care refers to lifestyle choices and visits with your health care provider that can promote health and wellness. What does preventive care include? A yearly physical exam. This is also called an annual well check. Dental exams once or twice a year. Routine eye exams. Ask your health care provider how  often you should have your eyes checked. Personal lifestyle choices, including: Daily care of your teeth and gums. Regular physical activity. Eating a healthy diet. Avoiding tobacco and drug use. Limiting alcohol use. Practicing safe sex. Taking low-dose aspirin every day. Taking vitamin and mineral supplements as recommended by your health care provider. What happens during an annual well check? The services and screenings done by your health care provider during your annual well check will depend on your age, overall health, lifestyle risk factors, and family history of disease. Counseling  Your health care provider may ask you questions about your: Alcohol use. Tobacco use. Drug use. Emotional well-being. Home and relationship well-being. Sexual activity. Eating habits. History of falls. Memory and ability to understand (cognition). Work and work Astronomer. Reproductive health. Screening  You may have the following tests or measurements: Height, weight, and BMI. Blood pressure. Lipid and cholesterol levels. These may be checked every 5 years, or more frequently if you are over 62 years old. Skin check. Lung cancer screening. You may have this screening every year starting at age 89 if you have a 30-pack-year history of smoking and currently smoke or have quit within the past 15 years. Fecal occult blood test (FOBT) of the stool. You may have this test every year starting at age 67. Flexible sigmoidoscopy or colonoscopy. You may have a sigmoidoscopy every 5 years or a colonoscopy every 10 years starting at age 63. Hepatitis C blood test. Hepatitis B blood test. Sexually transmitted disease (STD) testing. Diabetes screening. This is done by checking your blood sugar (glucose) after you have not eaten for a while (fasting). You may have this done every 1-3 years. Bone density scan. This is done to screen for osteoporosis. You may have this done starting at age 71. Mammogram.  This may be done every 1-2 years. Talk to your health care provider about how often you should have regular mammograms. Talk with your health care provider about your test results, treatment options, and if necessary, the need for more tests. Vaccines  Your health care provider may recommend certain vaccines, such as: Influenza vaccine. This is recommended every year. Tetanus, diphtheria, and acellular pertussis (Tdap, Td) vaccine. You may need a Td booster every 10 years. Zoster vaccine. You may need this after age 91. Pneumococcal 13-valent conjugate (PCV13) vaccine. One dose is recommended after age 33. Pneumococcal polysaccharide (PPSV23) vaccine. One dose is recommended after age 81. Talk to your health care provider about which screenings and vaccines you need and how often you need them. This information is not intended to replace advice given to you by your health care provider. Make sure you discuss any questions you have with your health care provider. Document Released: 11/22/2015 Document Revised: 07/15/2016 Document Reviewed: 08/27/2015 Elsevier Interactive Patient Education  2017 [employer].  Fall Prevention in the Home Falls can cause injuries. They can happen to people of all ages. There are many things you can do to make your home safe and to help prevent falls. What can I do on the outside of my home? Regularly fix  the edges of walkways and driveways and fix any cracks. Remove anything that might make you trip as you walk through a door, such as a raised step or threshold. Trim any bushes or trees on the path to your home. Use bright outdoor lighting. Clear any walking paths of anything that might make someone trip, such as rocks or tools. Regularly check to see if handrails are loose or broken. Make sure that both sides of any steps have handrails. Any raised decks and porches should have guardrails on the edges. Have any leaves, snow, or ice cleared regularly. Use sand  or salt on walking paths during winter. Clean up any spills in your garage right away. This includes oil or grease spills. What can I do in the bathroom? Use night lights. Install grab bars by the toilet and in the tub and shower. Do not use towel bars as grab bars. Use non-skid mats or decals in the tub or shower. If you need to sit down in the shower, use a plastic, non-slip stool. Keep the floor dry. Clean up any water that spills on the floor as soon as it happens. Remove soap buildup in the tub or shower regularly. Attach bath mats securely with double-sided non-slip rug tape. Do not have throw rugs and other things on the floor that can make you trip. What can I do in the bedroom? Use night lights. Make sure that you have a light by your bed that is easy to reach. Do not use any sheets or blankets that are too big for your bed. They should not hang down onto the floor. Have a firm chair that has side arms. You can use this for support while you get dressed. Do not have throw rugs and other things on the floor that can make you trip. What can I do in the kitchen? Clean up any spills right away. Avoid walking on wet floors. Keep items that you use a lot in easy-to-reach places. If you need to reach something above you, use a strong step stool that has a grab bar. Keep electrical cords out of the way. Do not use floor polish or wax that makes floors slippery. If you must use wax, use non-skid floor wax. Do not have throw rugs and other things on the floor that can make you trip. What can I do with my stairs? Do not leave any items on the stairs. Make sure that there are handrails on both sides of the stairs and use them. Fix handrails that are broken or loose. Make sure that handrails are as long as the stairways. Check any carpeting to make sure that it is firmly attached to the stairs. Fix any carpet that is loose or worn. Avoid having throw rugs at the top or bottom of the stairs.  If you do have throw rugs, attach them to the floor with carpet tape. Make sure that you have a light switch at the top of the stairs and the bottom of the stairs. If you do not have them, ask someone to add them for you. What else can I do to help prevent falls? Wear shoes that: Do not have high heels. Have rubber bottoms. Are comfortable and fit you well. Are closed at the toe. Do not wear sandals. If you use a stepladder: Make sure that it is fully opened. Do not climb a closed stepladder. Make sure that both sides of the stepladder are locked into place. Ask someone to hold it  for you, if possible. Clearly mark and make sure that you can see: Any grab bars or handrails. First and last steps. Where the edge of each step is. Use tools that help you move around (mobility aids) if they are needed. These include: Canes. Walkers. Scooters. Crutches. Turn on the lights when you go into a dark area. Replace any light bulbs as soon as they burn out. Set up your furniture so you have a clear path. Avoid moving your furniture around. If any of your floors are uneven, fix them. If there are any pets around you, be aware of where they are. Review your medicines with your doctor. Some medicines can make you feel dizzy. This can increase your chance of falling. Ask your doctor what other things that you can do to help prevent falls. This information is not intended to replace advice given to you by your health care provider. Make sure you discuss any questions you have with your health care provider. Document Released: 08/22/2009 Document Revised: 04/02/2016 Document Reviewed: 11/30/2014 Elsevier Interactive Patient Education  2017 [employer].

## 2023-05-03 NOTE — Progress Notes (Signed)
Subjective:   Emily Bentley is a 88 y.o. female who presents for Medicare Annual (Subsequent) preventive examination.  Visit Complete: In person  Review of Systems     Cardiac Risk Factors include: advanced age (>73men, >69 women);family history of premature cardiovascular disease;dyslipidemia     Objective:    Today's Vitals   05/03/23 1113  BP: 122/60  Pulse: 65  Resp: 16  Temp: 98.4 F (36.9 C)  TempSrc: Temporal  SpO2: 95%  Weight: 168 lb (76.2 kg)  Height: 5' 2.5" (1.588 m)  PainSc: 0-No pain   Body mass index is 30.24 kg/m.     05/03/2023   11:26 AM 05/01/2022    1:31 PM 08/07/2021    6:41 PM 05/07/2020    7:53 PM 09/29/2016    8:54 AM  Advanced Directives  Does Patient Have a Medical Advance Directive? Yes No No No No  Type of Estate agent of [place];Living will      Copy of Healthcare Power of Attorney in Chart? No - copy requested      Would patient like information on creating a medical advance directive? No - Patient declined No - Patient declined No - Patient declined No - Patient declined No - patient declined information    Current Medications (verified) Outpatient Encounter Medications as of 05/03/2023  Medication Sig   acetaminophen (TYLENOL) 650 MG CR tablet Take 650 mg by mouth every 8 (eight) hours as needed for pain.   aspirin EC 81 MG tablet Take 81 mg by mouth daily. Swallow whole.   azelastine (ASTELIN) 0.1 % nasal spray Place 2 sprays into both nostrils 2 (two) times daily.   clotrimazole-betamethasone (LOTRISONE) cream Apply 1 Application topically daily.   escitalopram (LEXAPRO) 10 MG tablet Take 1 tablet (10 mg total) by mouth daily.   fluticasone (FLONASE) 50 MCG/ACT nasal spray Place 2 sprays into both nostrils daily.   hydrOXYzine (ATARAX) 10 MG tablet Take 0.5-1 tablets (5-10 mg total) by mouth 2 (two) times daily as needed for anxiety.   loratadine (CLARITIN) 10 MG tablet Take 10 mg by mouth daily.    magnesium hydroxide (MILK OF MAGNESIA) 400 MG/5ML suspension Take 30 mLs by mouth daily as needed for mild constipation.   multivitamin-iron-minerals-folic acid (CENTRUM) chewable tablet Chew 1 tablet by mouth daily.   rosuvastatin (CRESTOR) 10 MG tablet Take 1 tablet (10 mg total) by mouth daily.   Sennosides (SENOKOT EXTRA STRENGTH) 17.2 MG TABS Take 1 tablet (17.2 mg total) by mouth daily.   traMADol (ULTRAM) 50 MG tablet Take 0.5-1 tablets (25-50 mg total) by mouth every 12 (twelve) hours as needed.   No facility-administered encounter medications on file as of 05/03/2023.    Allergies (verified) Sulfamethoxazole-trimethoprim   History: Past Medical History:  Diagnosis Date   Allergy    Arthritis    Asthma    Cataract    Chronic allergic rhinitis    with asthmatic component   Diverticulosis of colon (without mention of hemorrhage)    GERD (gastroesophageal reflux disease)    Hearing impairment    Hiatal hernia    History of bursitis    right shoulder   History of cochlear implant    Cant have an MRI   History of colon polyps    History of pneumonia    History of PSVT (paroxysmal supraventricular tachycardia)    Hormone replacement therapy (postmenopausal)    IBS (irritable bowel syndrome)    Peptic stricture of esophagus  Watermelon stomach    Past Surgical History:  Procedure Laterality Date   ABDOMINAL HYSTERECTOMY     APPENDECTOMY     CHOLECYSTECTOMY     COCHLEAR IMPLANT Left Oct '14   Done at [facility] - great results / Serial No: [id number] / Model: [id number]   ESOPHAGOGASTRODUODENOSCOPY     EYE SURGERY     POLYPECTOMY     TUBAL LIGATION     urethral dilatations     VENTRICULAR ABLATION SURGERY     Family History  Problem Relation Age of Onset   Diabetes Mother    Uterine cancer Mother    Coronary artery disease Father        CABG x 5 in 1985   Hypertension Father    Cancer Sister    Cancer Brother    Cancer Maternal Aunt    Colon cancer  Neg Hx    Social History   Socioeconomic History   Marital status: Widowed    Spouse name: Not on file   Number of children: 4   Years of education: Not on file   Highest education level: 12th grade  Occupational History   Occupation: retired    Associate Professor: [employer]  Tobacco Use   Smoking status: Never   Smokeless tobacco: Never  Vaping Use   Vaping Use: Never used  Substance and Sexual Activity   Alcohol use: No   Drug use: No   Sexual activity: Not Currently  Other Topics Concern   Not on file  Social History Narrative   Married '58- 9 years, divorced; married '68- widowed '721 son '66, 3 daughters '59, '60, '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 Retired '76Live- aloneOcc caffeine       07/2021 Hard of hearing - using new closed caption device    Social Determinants of Health   Financial Resource Strain: Low Risk  (05/03/2023)   Overall Financial Resource Strain (CARDIA)    Difficulty of Paying Living Expenses: Not hard at all  Food Insecurity: No Food Insecurity (05/03/2023)   Hunger Vital Sign    Worried About Running Out of Food in the Last Year: Never true    Ran Out of Food in the Last Year: Never true  Transportation Needs: No Transportation Needs (05/03/2023)   PRAPARE - Administrator, Civil Service (Medical): No    Lack of Transportation (Non-Medical): No  Physical Activity: Sufficiently Active (05/03/2023)   Exercise Vital Sign    Days of Exercise per Week: 5 days    Minutes of Exercise per Session: 30 min  Recent Concern: Physical Activity - Insufficiently Active (03/31/2023)   Exercise Vital Sign    Days of Exercise per Week: 1 day    Minutes of Exercise per Session: 30 min  Stress: Stress Concern Present (05/03/2023)   [employer] of Occupational Health - Occupational Stress Questionnaire    Feeling of Stress : Very much  Social Connections: Socially Isolated (05/03/2023)   Social Connection and Isolation Panel  [NHANES]    Frequency of Communication with Friends and Family: More than three times a week    Frequency of Social Gatherings with Friends and Family: Three times a week    Attends Religious Services: Never    Active Member of Clubs or Organizations: No    Attends Banker Meetings: Never    Marital Status: Widowed    Tobacco Counseling Counseling given: Not Answered   Clinical Intake:  Pre-visit preparation completed:  Yes  Pain : No/denies pain Pain Score: 0-No pain     Nutritional Risks: None Diabetes: No  How often do you need to have someone help you when you read instructions, pamphlets, or other written materials from your doctor or pharmacy?: 1 - Never What is the last grade level you completed in school?: HSG  Interpreter Needed?: No  Information entered by :: Gimena Buick N. Jaksen Fiorella, LPN.   Activities of Daily Living    05/03/2023   11:32 AM  In your present state of health, do you have any difficulty performing the following activities:  Hearing? 1  Comment COCHLEAR IMPLANTS  Vision? 0  Difficulty concentrating or making decisions? 1  Walking or climbing stairs? 0  Dressing or bathing? 0  Doing errands, shopping? 0  Preparing Food and eating ? N  Using the Toilet? N  In the past six months, have you accidently leaked urine? Y  Do you have problems with loss of bowel control? N  Managing your Medications? N  Managing your Finances? N  Housekeeping or managing your Housekeeping? N    Patient Care Team: Myrlene Broker, MD as PCP - General (Internal Medicine) Corky Crafts, MD as PCP - Cardiology (Cardiology) Hilarie Fredrickson, MD as Consulting Physician (Gastroenterology) Randa Spike Kelton Pillar, LCSW as [facility] Care Management (Licensed Clinical Social Worker)  Indicate any recent Medical Services you may have received from other than [facility] providers in the past year (date may be approximate).     Assessment:   This  is a routine wellness examination for Emily Bentley.  Hearing/Vision screen Hearing Screening - Comments:: PATIENT HAS COCHLEAR IMPLANTS. Vision Screening - Comments:: PATIENTS WEARS OTC READERS.  EYE EXAM DONE BY Velna Ochs, MD.  Dietary issues and exercise activities discussed:     Goals Addressed               This Visit's Progress     Patient Stated she feels depressed and would like to talk about counseling support (pt-stated)        Care Coordination Interventions:   Depression screen reviewed  PHQ 2/9 completed: GAD 7 completed Active Listening Used Emotional Support Provided. Provided some counseling support related to client needs Spoke of Care Coordination Support Program services Discussed counseling resources       Patient wants to live independently and receive support if needed        Interventions:  Spoke with client today via phone about her current needs Spoke about hearing needs of client Discussed support client receives from granddaughter, Leverne Humbles Discussed transport needs. Client said she usually drives herself to her scheduled appointments. She drives to grocery store to obtain food items.   Reviewed ADLs completion for client.  Reviewed sleeping issues of client. She said she is sleeping well. She said she has her prescribed medications.  Reviewed mood of client. She said she does get anxious occasionally. She likes to watch TV to help her relax. Discussed support of PCP, Dr. Hillard Danker Informed client of RN support through program. Informed client of Pharmacy support in program.  Client did not feel that she needed RN or Pharmacist to call her at this time Encouraged client to call LCSW as needed at [phone] to discuss SW needs of client      Patient wants to maintain current actiities. Client wants to complete daily ADLs. Client wants to manage anxiety issues.        Care Coordination Interventions:  LCSW spoke  via phone today with  client about client needs. Active listening / Reflection utilized  Discussed Care Coordination program support with client  Client said that her brother passed away recently; this is difficult for client to manage. She said her anxiety has been harder to manage since her brother passed away. She said she does have prescribed medications and is taking medications as prescribed Client drives to most appointments. Client eats several small meals daily.  Client is sleeping well.   Discussed hearing needs of client. Client uses hearing aid to help her hear adequately Discussed pain issues of client. She spoke of pain from arthritis. Provided counseling support for client Encouraged client to call LCSW as needed for support at [phone]. Discussed grief management. She said her family is coming in soon and will be present for funeral service for her brother. Discussed coping with grief of client; discussed her managing anxiety issues Client appreciative of call from LCSW today LCSW to call client on 02/03/23 at 11:00 AM      Depression Screen    05/03/2023   11:54 AM 03/31/2023    9:03 AM 03/23/2023    3:38 PM 02/03/2023   11:19 AM 01/04/2023   10:18 AM 11/30/2022   10:42 AM 11/19/2022   10:03 AM  PHQ 2/9 Scores  PHQ - 2 Score 3 3 2 2 2 2  0  PHQ- 9 Score 5 7 8 7 8 7  0    Fall Risk    05/03/2023   11:29 AM 03/31/2023    9:03 AM 11/19/2022   10:03 AM 05/01/2022    1:33 PM 04/21/2022    9:59 AM  Fall Risk   Falls in the past year? 1 0 0 1 1  Number falls in past yr: 1 0 0 0 0  Injury with Fall? 0 0 0 1 1  Comment    sprained ankle   Risk for fall due to :   No Fall Risks    Follow up Falls prevention discussed Falls evaluation completed Falls evaluation completed Falls prevention discussed     MEDICARE RISK AT HOME:  Medicare Risk at Home - 05/03/23 1127     Any stairs in or around the home? No    If so, are there any without handrails? No    Home free of loose throw rugs in  walkways, pet beds, electrical cords, etc? Yes    Adequate lighting in your home to reduce risk of falls? Yes    Life alert? Yes    Use of a cane, walker or w/c? No    Grab bars in the bathroom? Yes    Shower chair or bench in shower? Yes    Elevated toilet seat or a handicapped toilet? Yes             TIMED UP AND GO:  Was the test performed?  Yes  Length of time to ambulate 10 feet: 8 sec Gait steady and fast without use of assistive device    Cognitive Function:        05/03/2023   11:53 AM 05/01/2022    1:35 PM  6CIT Screen  What Year? 0 points 0 points  What month? 0 points 0 points  What time? 0 points 0 points  Count back from 20 0 points 0 points  Months in reverse 0 points 0 points  Repeat phrase 0 points 0 points  Total Score 0 points 0 points    Immunizations Immunization History  Administered  Date(s) Administered   Fluad Quad(high Dose 65+) 09/27/2019, 10/16/2020, 11/19/2022   Influenza Split 09/16/2012   Influenza, High Dose Seasonal PF 09/27/2018, 01/10/2021   Influenza,inj,Quad PF,6+ Mos 08/07/2014, 08/29/2015, 08/05/2016   Influenza-Unspecified 08/23/2017, 09/27/2018   PFIZER(Purple Top)SARS-COV-2 Vaccination 11/24/2019, 12/15/2019, 09/13/2020, 01/10/2021, 07/23/2021   Pneumococcal Conjugate-13 04/25/2015   Pneumococcal Polysaccharide-23 09/16/2012, 01/10/2021   Td 01/27/2007   Tdap 02/03/2018   Zoster Recombinat (Shingrix) 02/04/2018, 04/19/2018   Zoster, Live 06/29/2013    TDAP status: Up to date  Flu Vaccine status: Up to date  Pneumococcal vaccine status: Up to date  Covid-19 vaccine status: Completed vaccines  Qualifies for Shingles Vaccine? Yes   Zostavax completed Yes   Shingrix Completed?: Yes  Screening Tests Health Maintenance  Topic Date Due   COVID-19 Vaccine (6 - 2023-24 season) 07/10/2022   MAMMOGRAM  12/23/2022   INFLUENZA VACCINE  06/10/2023   Medicare Annual Wellness (AWV)  05/02/2024   DTaP/Tdap/Td (3 - Td or  Tdap) 02/04/2028   Pneumonia Vaccine 33+ Years old  Completed   DEXA SCAN  Completed   Zoster Vaccines- Shingrix  Completed   HPV VACCINES  Aged Out    Health Maintenance  Health Maintenance Due  Topic Date Due   COVID-19 Vaccine (6 - 2023-24 season) 07/10/2022   MAMMOGRAM  12/23/2022    Colorectal cancer screening: No longer required.   Mammogram status: Completed 12/23/2021. Repeat every year  Bone Density status: Completed 10/16/2020. Results reflect: Bone density results: NORMAL. Repeat every 5 years.  Lung Cancer Screening: (Low Dose CT Chest recommended if Age 63-80 years, 20 pack-year currently smoking OR have quit w/in 15years.) does not qualify.   Lung Cancer Screening Referral: NO  Additional Screening:  Hepatitis C Screening: does not qualify; Completed: NO  Vision Screening: Recommended annual ophthalmology exams for early detection of glaucoma and other disorders of the eye. Is the patient up to date with their annual eye exam?  Yes  Who is the provider or what is the name of the office in which the patient attends annual eye exams? Velna Ochs, MD. If pt is not established with a provider, would they like to be referred to a provider to establish care? No .   Dental Screening: Recommended annual dental exams for proper oral hygiene  Diabetic Foot Exam: N/A  Community Resource Referral / Chronic Care Management: CRR required this visit?  No   CCM required this visit?  No     Plan:     I have personally reviewed and noted the following in the patient's chart:   Medical and social history Use of alcohol, tobacco or illicit drugs  Current medications and supplements including opioid prescriptions. Patient is not currently taking opioid prescriptions. Functional ability and status Nutritional status Physical activity Advanced directives List of other physicians Hospitalizations, surgeries, and ER visits in previous 12 months Vitals Screenings to  include cognitive, depression, and falls Referrals and appointments  In addition, I have reviewed and discussed with patient certain preventive protocols, quality metrics, and best practice recommendations. A written personalized care plan for preventive services as well as general preventive health recommendations were provided to patient.     Mickeal Needy, LPN   02/15/8118   After Visit Summary: PRINTED FOR PATIENT IN-OFFICE VISIT  Nurse Notes: Normal cognitive status assessed by direct observation by this Nurse Health Advisor. No abnormalities found.

## 2023-05-05 ENCOUNTER — Ambulatory Visit: Payer: Self-pay | Admitting: Licensed Clinical Social Worker

## 2023-05-05 NOTE — Patient Instructions (Signed)
Visit Information  Thank you for taking time to visit with me today. Please don't hesitate to contact me if I can be of assistance to you.   Following are the goals we discussed today:   Goals Addressed             This Visit's Progress    Patient wants to live independently and receive support if needed       Interventions:  Spoke with client today via phone about her current status needs. She has support from her granddaughter, Emily Bentley. Spoke about hearing needs of client. She wears one hearing aid Discussed support client receives from granddaughter, Emily Bentley Discussed transport needs. Client said she usually drives herself to her scheduled appointments. She drives to grocery store to obtain food items.   Reviewed sleeping issues of client. She said she is sleeping well. She said she has her prescribed medications.  Reviewed mood of client. She said she feels that her mood is stable at this point Discussed support of PCP, Dr. Hillard Danker. Client said she does not have appointment set up at present with PCP. Discussed walking of client. She has a cane to use as needed to walk Provided counseling support Encouraged client to call LCSW as needed for SW support at [phone]          Our next appointment is by telephone on 07/07/23 at 9:30 AM  Please call the care guide team at [phone] if you need to cancel or reschedule your appointment.   If you are experiencing a Mental Health or Behavioral Health Crisis or need someone to talk to, please go to [facility] [address] [place] [phone])   The patient verbalized understanding of instructions, educational materials, and care plan provided today and DECLINED offer to receive copy of patient instructions, educational materials, and care plan.   The patient has been provided with contact information for the care management team and has been advised to call with any  health related questions or concerns.   Kelton Pillar.Deigo Alonso MSW, LCSW Licensed Clinical Social Worker [facility] Care Management [phone]

## 2023-05-05 NOTE — Patient Outreach (Signed)
  Care Coordination   Follow Up Visit Note   05/05/2023 Name: Emily Bentley MRN: [id number] DOB: 25-Jul-1935  Emily Bentley is a 88 y.o. year old female who sees Myrlene Broker, MD for primary care. I spoke with  [name] by phone today.  What matters to the patients health and wellness today? Wants to live independently and receive  support as needed.    Goals Addressed             This Visit's Progress    Patient wants to live independently and receive support if needed       Interventions:  Spoke with client today via phone about her current status needs. She has support from her granddaughter, Francena Hanly. Spoke about hearing needs of client. She wears one hearing aid Discussed support client receives from granddaughter, Leverne Humbles Discussed transport needs. Client said she usually drives herself to her scheduled appointments. She drives to grocery store to obtain food items.   Reviewed sleeping issues of client. She said she is sleeping well. She said she has her prescribed medications.  Reviewed mood of client. She said she feels that her mood is stable at this point Discussed support of PCP, Dr. Hillard Danker. Client said she does not have appointment set up at present with PCP. Discussed walking of client. She has a cane to use as needed to walk Provided counseling support Encouraged client to call LCSW as needed for SW support at [phone]          SDOH assessments and interventions completed:  Yes  SDOH Interventions Today    Flowsheet Row Most Recent Value  SDOH Interventions   Depression Interventions/Treatment  Counseling  Physical Activity Interventions Other (Comments)  [some walking challenges]  Stress Interventions Provide Counseling  [stress related to managing medical needs]        Care Coordination Interventions:  Yes, provided   Interventions Today    Flowsheet Row Most Recent Value  Chronic Disease   Chronic  disease during today's visit Other  [spoke with client about client needs]  General Interventions   General Interventions Discussed/Reviewed General Interventions Discussed, Community Resources  [spoke with client about program support]  Exercise Interventions   Exercise Discussed/Reviewed Physical Activity  [has a cane to use as neede]  Physical Activity Discussed/Reviewed Physical Activity Discussed  Education Interventions   Education Provided Provided Education  Provided Verbal Education On Community Resources  Mental Health Interventions   Mental Health Discussed/Reviewed Anxiety  [client feels that her mood is stable]  Nutrition Interventions   Nutrition Discussed/Reviewed Nutrition Discussed  Pharmacy Interventions   Pharmacy Dicussed/Reviewed Pharmacy Topics Discussed        Follow up plan: Follow up call scheduled for 07/07/23 at 9:30 AM    Encounter Outcome:  Pt. Visit Completed   Kelton Pillar.Chyan Carnero MSW, LCSW Licensed Visual merchandiser [facility] Care Management [phone]

## 2023-05-10 ENCOUNTER — Other Ambulatory Visit: Payer: Self-pay | Admitting: Internal Medicine

## 2023-05-19 ENCOUNTER — Encounter: Payer: Self-pay | Admitting: Internal Medicine

## 2023-05-24 ENCOUNTER — Encounter: Payer: Self-pay | Admitting: Family Medicine

## 2023-05-24 ENCOUNTER — Ambulatory Visit: Payer: Medicare Other | Admitting: Family Medicine

## 2023-05-24 VITALS — BP 130/80 | HR 68 | Temp 97.7°F | Resp 20 | Ht 62.5 in | Wt 164.0 lb

## 2023-05-24 DIAGNOSIS — R42 Dizziness and giddiness: Secondary | ICD-10-CM

## 2023-05-24 DIAGNOSIS — F419 Anxiety disorder, unspecified: Secondary | ICD-10-CM | POA: Diagnosis not present

## 2023-05-24 DIAGNOSIS — I951 Orthostatic hypotension: Secondary | ICD-10-CM | POA: Diagnosis not present

## 2023-05-24 LAB — CBC WITH DIFFERENTIAL/PLATELET
Basophils Absolute: 0 10*3/uL (ref 0.0–0.1)
Basophils Relative: 0.6 % (ref 0.0–3.0)
Eosinophils Absolute: 0.2 10*3/uL (ref 0.0–0.7)
Eosinophils Relative: 2.2 % (ref 0.0–5.0)
HCT: 42.5 % (ref 36.0–46.0)
Hemoglobin: 13.7 g/dL (ref 12.0–15.0)
Lymphocytes Relative: 26.4 % (ref 12.0–46.0)
Lymphs Abs: 2.1 10*3/uL (ref 0.7–4.0)
MCHC: 32.2 g/dL (ref 30.0–36.0)
MCV: 82.3 fl (ref 78.0–100.0)
Monocytes Absolute: 0.7 10*3/uL (ref 0.1–1.0)
Monocytes Relative: 8.1 % (ref 3.0–12.0)
Neutro Abs: 5.1 10*3/uL (ref 1.4–7.7)
Neutrophils Relative %: 62.7 % (ref 43.0–77.0)
Platelets: 207 10*3/uL (ref 150.0–400.0)
RBC: 5.17 Mil/uL — ABNORMAL HIGH (ref 3.87–5.11)
RDW: 13.9 % (ref 11.5–15.5)
WBC: 8.1 10*3/uL (ref 4.0–10.5)

## 2023-05-24 LAB — COMPREHENSIVE METABOLIC PANEL
ALT: 8 U/L (ref 0–35)
AST: 21 U/L (ref 0–37)
Albumin: 3.7 g/dL (ref 3.5–5.2)
Alkaline Phosphatase: 66 U/L (ref 39–117)
BUN: 14 mg/dL (ref 6–23)
CO2: 29 mEq/L (ref 19–32)
Calcium: 9.7 mg/dL (ref 8.4–10.5)
Chloride: 104 mEq/L (ref 96–112)
Creatinine, Ser: 1.18 mg/dL (ref 0.40–1.20)
GFR: 42.1 mL/min — ABNORMAL LOW (ref >60.00)
Glucose, Bld: 93 mg/dL (ref 70–99)
Potassium: 4.6 mEq/L (ref 3.5–5.1)
Sodium: 138 mEq/L (ref 135–145)
Total Bilirubin: 1 mg/dL (ref 0.2–1.2)
Total Protein: 7.2 g/dL (ref 6.0–8.3)

## 2023-05-24 LAB — TSH: TSH: 3.49 u[IU]/mL (ref 0.35–5.50)

## 2023-05-24 NOTE — Progress Notes (Signed)
Called and spoke with Francena Hanly (Granddaughter on [place] ) about todays visit at the request of the patient. She is aware of dizziness and the orthostatic BP numbers. She already has the cardio apt for 8/2 and is aware to let us or them know if her symptoms worsen before the apt date.  She was also aware that patient it was discussed with patient to take movements very slow. Ie: laying to sitting and sitting to standing. She was advised to hold onto something and not make sudden movements.

## 2023-05-24 NOTE — Progress Notes (Signed)
Assessment & Plan:  1. Orthostatic hypotension Education provided on orthostatic hypotension. Encouraged to change positions slowly. - Ambulatory referral to Cardiology  2. Dizziness - CBC with Differential/Platelet - Comprehensive metabolic panel  3. Anxiety - TSH   Follow up plan: Return if symptoms worsen or fail to improve.  Emily Boston, MSN, APRN, FNP-C  Subjective:  HPI: Emily Bentley is a 88 y.o. female presenting on 05/24/2023 for Dizziness (Blackout episode about 3 weeks ago and had a fall. /Head and face itching / back of neck and ears hurt some. /Lips felt funny and eyes were matted. //These s/s are off and on )  Patient reports dizzy episodes that started three weeks ago. It occurs intermittently, but mostly upon standing. States it seems like somebody pulls the shade down on her vision and then raises it back up; it lasts <1 minute. She does feel like it takes her energy level down a knotch when it occurs. Denies chest pain, shortness of breath, palpitations, and heart rhythm irregularities.    ROS: Negative unless specifically indicated above in HPI.   Relevant past medical history reviewed and updated as indicated.   Allergies and medications reviewed and updated.   Current Outpatient Medications:    acetaminophen (TYLENOL) 650 MG CR tablet, Take 650 mg by mouth every 8 (eight) hours as needed for pain., Disp: , Rfl:    aspirin EC 81 MG tablet, Take 81 mg by mouth daily. Swallow whole., Disp: , Rfl:    azelastine (ASTELIN) 0.1 % nasal spray, Place 2 sprays into both nostrils 2 (two) times daily., Disp: 30 mL, Rfl: 0   escitalopram (LEXAPRO) 10 MG tablet, Take 1 tablet (10 mg total) by mouth daily., Disp: 90 tablet, Rfl: 1   fluticasone (FLONASE) 50 MCG/ACT nasal spray, Place 2 sprays into both nostrils daily., Disp: 48 g, Rfl: 3   hydrOXYzine (ATARAX) 10 MG tablet, Take 0.5-1 tablets (5-10 mg total) by mouth 2 (two) times daily as needed for anxiety., Disp:  30 tablet, Rfl: 0   loratadine (CLARITIN) 10 MG tablet, Take 10 mg by mouth daily., Disp: , Rfl:    multivitamin-iron-minerals-folic acid (CENTRUM) chewable tablet, Chew 1 tablet by mouth daily., Disp: , Rfl:    rosuvastatin (CRESTOR) 10 MG tablet, Take 1 tablet (10 mg total) by mouth daily., Disp: 90 tablet, Rfl: 3   traMADol (ULTRAM) 50 MG tablet, Take 0.5-1 tablets (25-50 mg total) by mouth every 12 (twelve) hours as needed., Disp: 30 tablet, Rfl: 3   clotrimazole-betamethasone (LOTRISONE) cream, Apply 1 Application topically daily. (Patient not taking: Reported on 05/24/2023), Disp: 30 g, Rfl: 0   magnesium hydroxide (MILK OF MAGNESIA) 400 MG/5ML suspension, Take 30 mLs by mouth daily as needed for mild constipation. (Patient not taking: Reported on 05/24/2023), Disp: , Rfl:    Sennosides (SENOKOT EXTRA STRENGTH) 17.2 MG TABS, Take 1 tablet (17.2 mg total) by mouth daily. (Patient not taking: Reported on 05/24/2023), Disp: 30 tablet, Rfl: 0  Allergies  Allergen Reactions   Sulfamethoxazole-Trimethoprim     Other reaction(s): Unknown    Objective:   BP 130/80   Pulse 68   Temp 97.7 F (36.5 C)   Resp 20   Ht 5' 2.5" (1.588 m)   Wt 164 lb (74.4 kg)   BMI 29.52 kg/m    Orthostatic Vitals for the past 48 hrs (Last 6 readings):  Patient Position Orthostatic BP Orthostatic Pulse BP Pulse BP Location Cuff Size  05/24/23 0935 -- -- --  130/80 68 -- --  05/24/23 1008 Supine 126/70 60 -- -- Right Arm Normal  05/24/23 1009 Supine 110/70 62 -- -- Right Arm Normal  05/24/23 1010 Supine (!) 82/60 58 -- -- Right Arm Normal    Physical Exam Vitals reviewed.  Constitutional:      General: She is not in acute distress.    Appearance: Normal appearance. She is not ill-appearing, toxic-appearing or diaphoretic.  HENT:     Head: Normocephalic and atraumatic.     Right Ear: Tympanic membrane, ear canal and external ear normal. There is no impacted cerumen.     Left Ear: Tympanic membrane, ear  canal and external ear normal. There is no impacted cerumen.  Eyes:     General: No scleral icterus.       Right eye: No discharge.        Left eye: No discharge.     Conjunctiva/sclera: Conjunctivae normal.  Cardiovascular:     Rate and Rhythm: Normal rate and regular rhythm.     Heart sounds: Normal heart sounds. No murmur heard.    No friction rub. No gallop.  Pulmonary:     Effort: Pulmonary effort is normal. No respiratory distress.     Breath sounds: Normal breath sounds. No stridor. No wheezing, rhonchi or rales.  Musculoskeletal:        General: Normal range of motion.     Cervical back: Normal range of motion.  Skin:    General: Skin is warm and dry.     Capillary Refill: Capillary refill takes less than 2 seconds.  Neurological:     General: No focal deficit present.     Mental Status: She is alert and oriented to person, place, and time. Mental status is at baseline.  Psychiatric:        Mood and Affect: Mood normal.        Behavior: Behavior normal.        Thought Content: Thought content normal.        Judgment: Judgment normal.

## 2023-06-03 NOTE — Progress Notes (Signed)
Cardiology Office Note:  .   Date:  06/11/2023  ID:  Emily Bentley, DOB [birth year], MRN [id number] PCP: Myrlene Broker, MD  [facility] Providers Cardiologist:  Lance Muss, MD    Patient Profile: .      PMH: Atypical chest pain CAD Coronary CTA 09/11/22 with coronary calcium score 940 (93rd percentile), mild CAD in mid left circumflex, first diagonal and posterior lateral artery, 25 to 49% stenosis, CADRADS2 Anxiety Palpitations SVT s/p ablation 2005 Aortic atherosclerosis Pulmonary artery dilation 43 mm, thickened pulmonary valve  Referred to cardiology for evaluation of atypical chest pain and seen by Dr. Eldridge Dace on 05/13/22. Records from PCP show "worsening anxiety and chest pains lasting 30-60 minutes resolve with rest or tramadol in the side and into neck area. Prior SVT s/p ablation but does not feel similar.  She reported worsening anxiety and an episode of palpitations during a dental cleaning.  Coronary CT was ordered for mapping of coronary anatomy and was completed 09/11/22. CT revealed CAC score 940, 93rd percentile, mild CAD in the mid left circumflex, first diagonal, and posterolateral arteries, 25 to 49% stenoses, severe dilation of the main pulmonary artery, 43 mm, suggesting elevated pulmonary pressures with thickened pulmonary valve, right ventricular enlargement.  She was asked about symptoms of sleep apnea and denied.  Was advised to start rosuvastatin 10 mg daily and return for follow-up in 1 year.        History of Present Illness: .   Emily Bentley is a very pleasant 88 y.o. female who is here today for evaluation of presyncope. No syncope. She is accompanied by a friend, is afraid to drive because of low blood pressure. Was found to have orthostatic hypotension at PCP visit 05/24/23 with decrease in BP from 126/70 lying to 82/60 standing. She reports feeling like she is going to pass out, most noticeably when going from sitting to standing in the  sunlight. Initially noticed this about 3 years ago but it has gotten worse. States it "feels like a shade comes down over her eyes, and then rises up." Reports good hydration with water, admits to drinking soda frequently but consumes about 40+ ounces water daily. Is using a syringe of warm water to help move her bowels.  Admits she can go long periods without eating, does not get very hungry. Feels that she has developed confusion associated with low BP. Had her eyes checked, no problems with vision.  She denies chest pain, shortness of breath, palpitations, orthopnea, PND. Reports leg weakness that is consistent, is not associated only with episodes of orthostasis.   ROS: See HPI       Studies Reviewed: Marland Kitchen   EKG Interpretation Date/Time:  Friday June 11 2023 09:45:44 EDT Ventricular Rate:  80 PR Interval:  184 QRS Duration:  72 QT Interval:  372 QTC Calculation: 429 R Axis:   24  Text Interpretation: Normal sinus rhythm Normal ECG When compared with ECG of 11-Feb-2010 02:47, No significant change was found Confirmed by Eligha Bridegroom [phone]) on 06/11/2023 1:19:34 PM     Risk Assessment/Calculations:             Physical Exam:   VS:  BP 98/64   Pulse 80   Ht 5' 2.5" (1.588 m)   Wt 165 lb 3.2 oz (74.9 kg)   SpO2 99%   BMI 29.73 kg/m    Wt Readings from Last 3 Encounters:  06/11/23 165 lb 3.2 oz (74.9 kg)  05/24/23 164  lb (74.4 kg)  05/03/23 168 lb (76.2 kg)    GEN: Well nourished, well developed in no acute distress NECK: No JVD; No carotid bruits CARDIAC: RRR, no murmurs, rubs, gallops RESPIRATORY:  Clear to auscultation without rales, wheezing or rhonchi  ABDOMEN: Soft, non-tender, non-distended EXTREMITIES:  No edema; No deformity     ASSESSMENT AND PLAN: .    Presyncope/Orthostatic hypotension: Frequent episodes of orthostatic hypotension/presyncope over the past few weeks.  These are also associated with episodes of confusion. Found to have orthostatic hypotension  at PCP on 7/15, labs at that visit (TSH, CMET, CBC) unremarkable. No evidence of bleeding. BP during exam 100/70 sitting, 78/60 standing. She did not become symptomatic during the assessment.  We will start midodrine 5 mg 3 times daily.  I have asked her to monitor BP for goal SBP > 110 with midodrine. Advised her to call if BP does not improve. Advised her to eat small meals that are high in fiber and protein every 2-3 hours to avoid prolonged periods of fasting.   Leg weakness: Reports bilateral lower extremity weakness.  This is not associated only with times of hypotension. Her friend states she needs someone to help her with exercises. I will refer her to Physical Therapy for functional assessment.   CAD without angina: Mild nonobstructive CAD in LCx, first diagonal, and posterolateral artery on CCTA 09/11/22. She denies chest pain, dyspnea, or other symptoms concerning for angina.  No indication for further ischemic evaluation at this time.  She had previously been taking aspirin but stopped it.  Advised her she does not need to resume aspirin. Continue rosuvastatin.   Constipation: She is using a syringe of warm water to help move her bowels.  I have encouraged her to increase dietary fiber and to try MiraLAX twice daily.  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 She denies shortness of breath. Not specifically addressed today.  Hyperlipidemia/Aortic atherosclerosis: LDL 45 on 12/22/22.  Well-controlled.  Continue rosuvastatin.       Dispo: 2 months with me  Signed, Eligha Bridegroom, NP-C

## 2023-06-11 ENCOUNTER — Encounter: Payer: Self-pay | Admitting: Nurse Practitioner

## 2023-06-11 ENCOUNTER — Ambulatory Visit: Payer: Medicare Other | Attending: Nurse Practitioner | Admitting: Nurse Practitioner

## 2023-06-11 VITALS — BP 98/64 | HR 80 | Ht 62.5 in | Wt 165.2 lb

## 2023-06-11 DIAGNOSIS — E785 Hyperlipidemia, unspecified: Secondary | ICD-10-CM

## 2023-06-11 DIAGNOSIS — I951 Orthostatic hypotension: Secondary | ICD-10-CM | POA: Diagnosis not present

## 2023-06-11 DIAGNOSIS — R55 Syncope and collapse: Secondary | ICD-10-CM | POA: Insufficient documentation

## 2023-06-11 DIAGNOSIS — K5900 Constipation, unspecified: Secondary | ICD-10-CM

## 2023-06-11 DIAGNOSIS — I272 Pulmonary hypertension, unspecified: Secondary | ICD-10-CM | POA: Diagnosis not present

## 2023-06-11 DIAGNOSIS — R29898 Other symptoms and signs involving the musculoskeletal system: Secondary | ICD-10-CM | POA: Diagnosis not present

## 2023-06-11 DIAGNOSIS — I700 Atherosclerosis of aorta: Secondary | ICD-10-CM | POA: Diagnosis not present

## 2023-06-11 DIAGNOSIS — I2510 Atherosclerotic heart disease of native coronary artery without angina pectoris: Secondary | ICD-10-CM | POA: Insufficient documentation

## 2023-06-11 DIAGNOSIS — Z139 Encounter for screening, unspecified: Secondary | ICD-10-CM

## 2023-06-11 MED ORDER — MIDODRINE HCL 5 MG PO TABS: 5.0000 mg | ORAL_TABLET | Freq: Three times a day (TID) | ORAL | 3 refills | Status: DC

## 2023-06-11 NOTE — Patient Instructions (Addendum)
Medication Instructions:   START Midodrine one (1) tablet by mouth ( 5 mg) three times daily.   You can try Miralax for Constipation.  *If you need a refill on your cardiac medications before your next appointment, please call your pharmacy*   Lab Work:  None ordered.  If you have labs (blood work) drawn today and your tests are completely normal, you will receive your results only by: MyChart Message (if you have MyChart) OR A paper copy in the mail If you have any lab test that is abnormal or we need to change your treatment, we will call you to review the results.   Testing/Procedures:  None ordered.   Follow-Up: At [facility], you and your health needs are our priority.  As part of our continuing mission to provide you with exceptional heart care, we have created designated Provider Care Teams.  These Care Teams include your primary Cardiologist (physician) and Advanced Practice Providers (APPs -  Physician Assistants and Nurse Practitioners) who all work together to provide you with the care you need, when you need it.  We recommend signing up for the patient portal called "MyChart".  Sign up information is provided on this After Visit Summary.  MyChart is used to connect with patients for Virtual Visits (Telemedicine).  Patients are able to view lab/test results, encounter notes, upcoming appointments, etc.  Non-urgent messages can be sent to your provider as well.   To learn more about what you can do with MyChart, go to [email or web address].    Your next appointment:   2 month(s)  Provider:   Eligha Bridegroom, NP         Other Instructions  You have been referred to Physical Therapy.  This referral goes in a work que and the office will call you to schedule a consult for leg weakness and Function assessment.       Mediterranean Diet  Why follow it? Research shows. Those who follow the Mediterranean diet have a reduced risk of heart disease  The diet  is associated with a reduced incidence of Parkinson's and Alzheimer's diseases People following the diet may have longer life expectancies and lower rates of chronic diseases  The Dietary Guidelines for Americans recommends the Mediterranean diet as an eating plan to promote health and prevent disease  What Is the Mediterranean Diet?  Healthy eating plan based on typical foods and recipes of Mediterranean-style cooking The diet is primarily a plant based diet; these foods should make up a majority of meals   Starches - Plant based foods should make up a majority of meals - They are an important sources of vitamins, minerals, energy, antioxidants, and fiber - Choose whole grains, foods high in fiber and minimally processed items  - Typical grain sources include wheat, oats, barley, corn, brown rice, bulgar, farro, millet, polenta, couscous  - Various types of beans include chickpeas, lentils, fava beans, black beans, white beans   Fruits  Veggies - Large quantities of antioxidant rich fruits & veggies; 6 or more servings  - Vegetables can be eaten raw or lightly drizzled with oil and cooked  - Vegetables common to the traditional Mediterranean Diet include: artichokes, arugula, beets, broccoli, brussel sprouts, cabbage, carrots, celery, collard greens, cucumbers, eggplant, kale, leeks, lemons, lettuce, mushrooms, okra, onions, peas, peppers, potatoes, pumpkin, radishes, rutabaga, shallots, spinach, sweet potatoes, turnips, zucchini - Fruits common to the Mediterranean Diet include: apples, apricots, avocados, cherries, clementines, dates, figs, grapefruits, grapes, melons, nectarines, oranges, peaches, pears,  pomegranates, strawberries, tangerines  Fats - Replace butter and margarine with healthy oils, such as olive oil, canola oil, and tahini  - Limit nuts to no more than a handful a day  - Nuts include walnuts, almonds, pecans, pistachios, pine nuts  - Limit or avoid candied, honey roasted or  heavily salted nuts - Olives are central to the Mediterranean diet - can be eaten whole or used in a variety of dishes   Meats Protein - Limiting red meat: no more than a few times a month - When eating red meat: choose lean cuts and keep the portion to the size of deck of cards - Eggs: approx. 0 to 4 times a week  - Fish and lean poultry: at least 2 a week  - Healthy protein sources include, chicken, [place], lean beef, lamb - Increase intake of seafood such as tuna, salmon, trout, mackerel, shrimp, scallops - Avoid or limit high fat processed meats such as sausage and bacon  Dairy - Include moderate amounts of low fat dairy products  - Focus on healthy dairy such as fat free yogurt, skim milk, low or reduced fat cheese - Limit dairy products higher in fat such as whole or 2% milk, cheese, ice cream  Alcohol - Moderate amounts of red wine is ok  - No more than 5 oz daily for women (all ages) and men older than age 76  - No more than 10 oz of wine daily for men younger than 54  Other - Limit sweets and other desserts  - Use herbs and spices instead of salt to flavor foods  - Herbs and spices common to the traditional Mediterranean Diet include: basil, bay leaves, chives, cloves, cumin, fennel, garlic, lavender, marjoram, mint, oregano, parsley, pepper, rosemary, sage, savory, sumac, tarragon, thyme   It's not just a diet, it's a lifestyle:  The Mediterranean diet includes lifestyle factors typical of those in the region  Foods, drinks and meals are best eaten with others and savored Daily physical activity is important for overall good health This could be strenuous exercise like running and aerobics This could also be more leisurely activities such as walking, housework, yard-work, or taking the stairs Moderation is the key; a balanced and healthy diet accommodates most foods and drinks Consider portion sizes and frequency of consumption of certain foods   Meal Ideas & Options:   Breakfast:  Whole wheat toast or whole wheat English muffins with peanut butter & hard boiled egg Steel cut oats topped with apples & cinnamon and skim milk  Fresh fruit: banana, strawberries, melon, berries, peaches  Smoothies: strawberries, bananas, greek yogurt, peanut butter Low fat greek yogurt with blueberries and granola  Egg white omelet with spinach and mushrooms Breakfast couscous: whole wheat couscous, apricots, skim milk, cranberries  Sandwiches:  Hummus and grilled vegetables (peppers, zucchini, squash) on whole wheat bread   Grilled chicken on whole wheat pita with lettuce, tomatoes, cucumbers or tzatziki  [place] salad on whole wheat bread: tuna salad made with greek yogurt, olives, red peppers, capers, green onions Garlic rosemary lamb pita: lamb sauted with garlic, rosemary, salt & pepper; add lettuce, cucumber, greek yogurt to pita - flavor with lemon juice and black pepper  Seafood:  Mediterranean grilled salmon, seasoned with garlic, basil, parsley, lemon juice and black pepper Shrimp, lemon, and spinach whole-grain pasta salad made with low fat greek yogurt  Seared scallops with lemon orzo  Seared tuna steaks seasoned salt, pepper, coriander topped with tomato mixture of  olives, tomatoes, olive oil, minced garlic, parsley, green onions and cappers  Meats:  Herbed greek chicken salad with kalamata olives, cucumber, feta  Red bell peppers stuffed with spinach, bulgur, lean ground beef (or lentils) & topped with feta   Kebabs: skewers of chicken, tomatoes, onions, zucchini, squash  [place] burgers: made with red onions, mint, dill, lemon juice, feta cheese topped with roasted red peppers Vegetarian Cucumber salad: cucumbers, artichoke hearts, celery, red onion, feta cheese, tossed in olive oil & lemon juice  Hummus and whole grain pita points with a greek salad (lettuce, tomato, feta, olives, cucumbers, red onion) Lentil soup with celery, carrots made with vegetable broth,  garlic, salt and pepper  Tabouli salad: parsley, bulgur, mint, scallions, cucumbers, tomato, radishes, lemon juice, olive oil, salt and pepper.    High-Fiber Eating Plan Fiber, also called dietary fiber, is found in foods such as fruits, vegetables, whole grains, and beans. A high-fiber diet can be good for your health. Your health care provider may recommend a high-fiber diet to help: Prevent trouble pooping (constipation). Lower your cholesterol. Treat the following conditions: Hemorrhoids. This is inflammation of veins in the anus. Inflammation of specific areas of the digestive tract. Irritable bowel syndrome (IBS). This is a problem of the large intestine, also called the colon, that sometimes causes belly pain and bloating. Prevent overeating as part of a weight-loss plan. Lower the risk of heart disease, type 2 diabetes, and certain cancers. What are tips for following this plan? Reading food labels  Check the nutrition facts label on foods for the amount of dietary fiber. Choose foods that have 4 grams of fiber or more per serving. The recommended goals for how much fiber you should eat each day include: Males 63 years old or younger: 30-34 g. Males over 51 years old: 28-34 g. Females 91 years old or younger: 25-28 g. Females over 44 years old: 22-25 g. Your daily fiber goal is _____________ g. Shopping Choose whole fruits and vegetables instead of processed. For example, choose apples instead of apple juice or applesauce. Choose a variety of high-fiber foods such as avocados, lentils, oats, and pinto beans. Read the nutrition facts label on foods. Check for foods with added fiber. These foods often have high sugar and salt (sodium) amounts per serving. Cooking Use whole-grain flour for baking and cooking. Cook with brown rice instead of white rice. Make meals that have a lot of beans and vegetables in them, such as chili or vegetable-based soups. Meal planning Start the day  with a breakfast that is high in fiber, such as a cereal that has 5 g of fiber or more per serving. Eat breads and cereals that are made with whole-grain flour instead of refined flour or white flour. Eat brown rice, bulgur wheat, or millet instead of white rice. Use beans in place of meat in soups, salads, and pasta dishes. Be sure that half of the grains you eat each day are whole grains. General information You can get the recommended amount of dietary fiber by: Eating a variety of fruits, vegetables, grains, nuts, and beans. Taking a fiber supplement if you aren't able to eat enough fiber. It's better to get fiber through food than from a supplement. Slowly increase how much fiber you eat. If you increase the amount of fiber you eat too quickly, you may have bloating, cramping, or gas. Drink plenty of water to help you digest fiber. Choose high-fiber snacks, such as berries, raw vegetables, nuts, and popcorn. What  foods should I eat? Fruits Berries. Pears. Apples. Oranges. Avocado. Prunes and raisins. Dried figs. Vegetables Sweet potatoes. Spinach. Kale. Artichokes. Cabbage. Broccoli. Cauliflower. Green peas. Carrots. Squash. Grains Whole-grain breads. Multigrain cereal. Oats and oatmeal. Brown rice. Barley. Bulgur wheat. Millet. Quinoa. Bran muffins. Popcorn. Rye wafer crackers. Meats and other proteins Navy beans, kidney beans, and pinto beans. Soybeans. Split peas. Lentils. Nuts and seeds. Dairy Fiber-fortified yogurt. Fortified means that fiber has been added to the product. Beverages Fiber-fortified soy milk. Fiber-fortified orange juice. Other foods Fiber bars. The items listed above may not be all the foods and drinks you can have. Talk to a dietitian to learn more. What foods should I avoid? Fruits Fruit juice. Cooked, strained fruit. Vegetables Fried potatoes. Canned vegetables. Well-cooked vegetables. Grains White bread. Pasta made with refined flour. White  rice. Meats and other proteins Fatty meat. Fried chicken or fried fish. Dairy Milk. Cream cheese. Sour cream. Fats and oils Butters. Beverages Soft drinks. Other foods Cakes and pastries. The items listed above may not be all the foods and drinks you should avoid. Talk to a dietitian to learn more. This information is not intended to replace advice given to you by your health care provider. Make sure you discuss any questions you have with your health care provider. Document Revised: 01/18/2023 Document Reviewed: 01/18/2023 Elsevier Patient Education  2024 [employer].

## 2023-06-29 ENCOUNTER — Ambulatory Visit: Payer: Medicare Other

## 2023-07-07 ENCOUNTER — Ambulatory Visit: Payer: Self-pay | Admitting: Licensed Clinical Social Worker

## 2023-07-07 NOTE — Patient Instructions (Signed)
Visit Information  Thank you for taking time to visit with me today. Please don't hesitate to contact me if I can be of assistance to you.   Following are the goals we discussed today:   Goals Addressed             This Visit's Progress    Patient wants to live independently and receive support if needed       Interventions:  Spoke with client via phone today about client needs. Leverne Humbles, granddaughter of client, was also on phone call and she also talked with LCSW about client needs. Leverne Humbles is contact for client Discussed pain issues of client. Discussed ambulation of client. Client said she sometimes is dizzy when walking Client said she has decreased energy from time to time Discussed support client receives from granddaughter, Leverne Humbles Discussed transport needs. Client said she has transport help from family members. No transport issues at this time.  Reviewed sleeping issues of client. She said she is sleeping more recently. Discussed support of PCP, Dr. Hillard Danker. Encouraged client to call PCP office and talk with nurse at that office about current symptoms of client. Client had said her blood pressure has been running low and she is more fatigued. She is sleeping more and really tired in afternoons. Again, encouraged ciient to call nurse at Dr. Frutoso Chase office to discuss her symptoms and perhaps discuss blood pressure log client is keeping Discussed walking of client. She has a cane to use as needed to walk Provided counseling support Encouraged client or Leverne Humbles to call LCSW as needed for SW support  for client at [phone]          Our next appointment is by telephone on 08/09/23 at 2:30 PM   Please call the care guide team at [phone] if you need to cancel or reschedule your appointment.   If you are experiencing a Mental Health or Behavioral Health Crisis or need someone to talk to, please go to [facility] [address], [place] [phone])   The patient verbalized understanding of instructions, educational materials, and care plan provided today and DECLINED offer to receive copy of patient instructions, educational materials, and care plan.   The patient has been provided with contact information for the care management team and has been advised to call with any health related questions or concerns.   Kelton Pillar.Kyira Volkert MSW, LCSW Licensed Visual merchandiser [facility] Care Management [phone]

## 2023-07-07 NOTE — Patient Outreach (Signed)
  Care Coordination   Follow Up Visit Note   07/07/2023 Name: KRISHAUNA RAMON MRN: [id number] DOB: Nov 03, 1935  MARCITA MALCOLM is a 88 y.o. year old female who sees Myrlene Broker, MD for primary care. I spoke with  [name] / Leverne Humbles, granddaughter of client via phone today.  What matters to the patients health and wellness today?  Patient wants to live independently and receive support if needed.    Goals Addressed             This Visit's Progress    Patient wants to live independently and receive support if needed       Interventions:  Spoke with client via phone today about client needs. Leverne Humbles, granddaughter of client, was also on phone call and she also talked with LCSW about client needs. Leverne Humbles is contact for client Discussed pain issues of client. Discussed ambulation of client. Client said she sometimes is dizzy when walking Client said she has decreased energy from time to time Discussed support client receives from granddaughter, Leverne Humbles Discussed transport needs. Client said she has transport help from family members. No transport issues at this time. R. Reviewed sleeping issues of client. She said she is sleeping more recently. Discussed support of PCP, Dr. Hillard Danker. Encouraged client to call PCP office and talk with nurse at that office about current symptoms of client. Client had said her blood pressure has been running low and she is more fatigued. She is sleeping more and really tired in afternoons. Again, encouraged ciient to call nurse at Dr. Frutoso Chase office to discuss her symptoms and perhaps discuss blood pressure log client is keeping Discussed walking of client. She has a cane to use as needed to walk Provided counseling support Encouraged client or Leverne Humbles to call LCSW as needed for SW support  for client at [phone]          SDOH assessments and interventions completed:  Yes  SDOH  Interventions Today    Flowsheet Row Most Recent Value  SDOH Interventions   Depression Interventions/Treatment  Counseling  Physical Activity Interventions Other (Comments)  [mobility challenges]  Stress Interventions Provide Counseling  [has stress in managing daily needs. At risk for falls]        Care Coordination Interventions:  Yes, provided   Interventions Today    Flowsheet Row Most Recent Value  Chronic Disease   Chronic disease during today's visit Other  [spoke with client about client needs]  General Interventions   General Interventions Discussed/Reviewed General Interventions Discussed, Community Resources  [reviewed program support]  Exercise Interventions   Exercise Discussed/Reviewed Physical Activity  Physical Activity Discussed/Reviewed Physical Activity Discussed  Education Interventions   Education Provided Provided Education  Provided Verbal Education On Community Resources  Mental Health Interventions   Mental Health Discussed/Reviewed Coping Strategies  [no mood issues noted]  Pharmacy Interventions   Pharmacy Dicussed/Reviewed Pharmacy Topics Discussed  Safety Interventions   Safety Discussed/Reviewed Fall Risk        Follow up plan: Follow up call scheduled for 08/09/23 at 2:30 PM     Encounter Outcome:  Pt. Visit Completed   Kelton Pillar.Rand Etchison MSW, LCSW Licensed Visual merchandiser [facility] Care Management [phone]

## 2023-07-13 NOTE — Therapy (Signed)
OUTPATIENT PHYSICAL THERAPY LOWER EXTREMITY EVALUATION   Patient Name: Emily Bentley MRN: [id number] DOB:09/11/1935, 88 y.o., female Today's Date: 07/17/2023  END OF SESSION:  PT End of Session - 07/17/23 1529     Visit Number 1    Number of Visits 4    Date for PT Re-Evaluation 08/20/23    Authorization Type MEDICARE PART A AND B; BCBS/[employer] PPO    PT Start Time 1235    PT Stop Time 1320    PT Time Calculation (min) 45 min    Activity Tolerance Patient tolerated treatment well    Behavior During Therapy WFL for tasks assessed/performed             Past Medical History:  Diagnosis Date   Allergy    Arthritis    Asthma    Cataract    Chronic allergic rhinitis    with asthmatic component   Diverticulosis of colon (without mention of hemorrhage)    GERD (gastroesophageal reflux disease)    Hearing impairment    Hiatal hernia    History of bursitis    right shoulder   History of cochlear implant    Cant have an MRI   History of colon polyps    History of pneumonia    History of PSVT (paroxysmal supraventricular tachycardia)    Hormone replacement therapy (postmenopausal)    IBS (irritable bowel syndrome)    Peptic stricture of esophagus    Watermelon stomach    Past Surgical History:  Procedure Laterality Date   ABDOMINAL HYSTERECTOMY     APPENDECTOMY     CHOLECYSTECTOMY     COCHLEAR IMPLANT Left Oct '14   Done at [facility] - great results / Serial No: [id number] / Model: [id number]   ESOPHAGOGASTRODUODENOSCOPY     EYE SURGERY     POLYPECTOMY     TUBAL LIGATION     urethral dilatations     VENTRICULAR ABLATION SURGERY     Patient Active Problem List   Diagnosis Date Noted   Rash 11/19/2022   Otalgia of both ears 07/16/2022   Atypical chest pain 04/21/2022   Vertigo 01/28/2022   Palpitations 01/28/2022   Allergic rhinitis due to pollen 07/30/2021   Neck swelling 12/23/2020   Laryngopharyngeal reflux 10/28/2020   Vaginal prolapse  10/18/2020   Vitamin D deficiency 08/20/2020   Tingling 08/20/2020   Myalgia 08/20/2020   Bone pain 08/20/2020   Bilateral thoracic back pain 08/20/2020   Dysphagia 01/04/2020   Chronic bilateral thoracic back pain 09/28/2019   Scalp cyst 09/28/2019   Allergic rhinitis 09/28/2019   Constipation 08/25/2018   Elevated blood pressure reading in office without diagnosis of hypertension 08/25/2018   Anxiety 02/04/2018   Fatigue 12/15/2016   Degenerative disc disease, lumbar 09/16/2015   Routine general medical examination at a health care facility 07/02/2013   H/O supraventricular tachycardia 08/25/2012   Bilateral sensorineural hearing loss 07/28/2012   OAB (overactive bladder) 05/25/2012   Osteopenia 10/09/2009   Cochlear implant in place 12/30/2007   GERD (gastroesophageal reflux disease) 12/30/2007    PCP: Myrlene Broker, MD  REFERRING PROVIDER: Levi Aland, NP  REFERRING DIAG:  I70.0 (ICD-10-CM) - Aortic atherosclerosis (HCC) R29.898 (ICD-10-CM) - Weakness of both lower extremities Z13.9 (ICD-10-CM) - Encounter for risk and functional assessment of patient  THERAPY DIAG:  Muscle weakness (generalized)  Difficulty in walking, not elsewhere classified  Rationale for Evaluation and Treatment: Rehabilitation  ONSET DATE: Chronic  SUBJECTIVE:   SUBJECTIVE  STATEMENT: Pt reports in the past 3 months a hx of 2 falls due to her becoming light headed after standing up from sitting. She has also experienced another fall related to tripping on a raise edge of a sidewalk. Additionally, she notes having LE weakness and when she has anxiety her they start to shake. Pt states having a hx LBP which bothers with prolonged standing and walking. Pt takes tylenol and tramadol for low back pain  PERTINENT HISTORY: History of cochlear implant   PAIN:  Are you having pain?  Yes: NPRS scale: 0/10; 7/10 with prolonged standing Pain location: low back Pain description:  ache Aggravating factors: prolonged standing and walking Relieving factors: Siting, lying down, tramadol, tylenol  PRECAUTIONS: Fall  RED FLAGS: None   WEIGHT BEARING RESTRICTIONS: No  FALLS:  Has patient fallen in last 6 months? Yes. Number of falls 3 2 related to syncope and 1 due to tripping  LIVING ENVIRONMENT: Lives with: lives alone Lives in: House/apartment Stairs: Yes: External: 1 steps; uses door frame Has following equipment at home: Single point cane does not use  OCCUPATION: Retired  PLOF: Independent  PATIENT GOALS: Bowling, walking - not since issues with light headedness  NEXT MD VISIT: 08/13/23  OBJECTIVE:   DIAGNOSTIC FINDINGS: NA  PATIENT SURVEYS:  FOTO: 42% function; 56% predicted  COGNITION: Overall cognitive status: Within functional limits for tasks assessed     SENSATION: WFL  EDEMA:  None noted  MUSCLE LENGTH: Hamstrings: Right NT ; Left NT Thomas test: Right NT ; Left NT  POSTURE: rounded shoulders, forward head, and flexed trunk   PALPATION: NT  LOWER EXTREMITY ROM:  Grossly WNLs Active ROM Right eval Left eval  Hip flexion    Hip extension    Hip abduction    Hip adduction    Hip internal rotation    Hip external rotation    Knee flexion    Knee extension    Ankle dorsiflexion    Ankle plantarflexion    Ankle inversion    Ankle eversion     (Blank rows = not tested)  LOWER EXTREMITY MMT:  MMT Right eval Left eval  Hip flexion 4 4  Hip extension    Hip abduction 4 4  Hip adduction    Hip internal rotation    Hip external rotation 4 4  Knee flexion    Knee extension    Ankle dorsiflexion    Ankle plantarflexion    Ankle inversion    Ankle eversion     (Blank rows = not tested)  LOWER EXTREMITY SPECIAL TESTS:  NT  Orthostatic Hypotension Assessment:  Sitting: BP L arm 110/66  Standing BP L arm 88/50. Pt reported not experiencing light headedness  FUNCTIONAL TESTS:  Single leg stand= L 1", R  1"  Standing EC and feet apart= 20+"  Standing feet together= 1+ min GAIT: Distance walked: 213ft Assistive device utilized: None Level of assistance: Complete Independence Comments: WNLs   TREATMENT: [facility] Adult PT Treatment:                                                DATE: 06/29/2023 Self care: Fall prevention for orthostatic hypotension  PATIENT EDUCATION:  Education details: eval findings, FOTO, HEP, POC Person educated: Patient Education method: Explanation, Demonstration, and Handouts Education comprehension: verbalized understanding and returned demonstration  HOME EXERCISE PROGRAM: To be developed   ASSESSMENT:  CLINICAL IMPRESSION: Patient is a 88 y.o. F who was seen today for physical therapy evaluation and treatment for I70.0 (ICD-10-CM) - Aortic atherosclerosis (HCC) R29.898 (ICD-10-CM) - Weakness of both lower extremities Z13.9 (ICD-10-CM) - Encounter for risk and functional assessment of patient Pt's presents with a Hx of recent falls with signs and symptoms most consistent with orthostatic hypotension. Upon standing from sitting, the pt's BP did drop, but pt did not experience light headedness today. Additionally, pt demonstrates decreased LE strength and single leg standing balance. Recommended a PT program of 2w6 to improve LE strength and balance, however pt preferred to come in for a few sessions, 3x over 5 weeks, to develop a HEP to address these areas and for her to continue on her own.   OBJECTIVE IMPAIRMENTS: decreased activity tolerance, decreased balance, difficulty walking, decreased strength, and pain.   ACTIVITY LIMITATIONS: standing, bathing, dressing, locomotion level, and caring for others  PARTICIPATION LIMITATIONS: meal prep, cleaning, laundry, shopping, and community activity  PERSONAL FACTORS: Age, Past/current experiences, and Time since onset of injury/illness/exacerbation are also affecting patient's functional outcome.   REHAB POTENTIAL:  Good  CLINICAL DECISION MAKING: Evolving/moderate complexity  EVALUATION COMPLEXITY: Moderate   GOALS: Goals reviewed with patient? No  SHORT TERM GOALS: Target date: 08/06/2023   Pt will be compliant and knowledgeable with initial HEP for improved comfort and carryover Baseline: To be developed Goal status: INITIAL  LONG TERM GOALS: Target date: 08/20/23  Pt will be Ind in a final HEP to maintain achieved LOF Baseline: To be developed Goal status: INITIAL  2.  Increase bilat LE strength to 4+/5 to assist with improved balance Baseline: 4/5 Goal status: INITIAL  3.  Increase single leg standing time bilat to 8' or greater for improved balance Baseline: Bilat 1" Goal status: INITIAL  4.  Pt will be able to recall 3 measures to prevent future falls Baseline: Reviewed orthostatic hypotension precautions Goal status: INITIAL  5.  Pt's FOTO score will improved to the predicted value of 56% as indication of improved function  Baseline: 42% Goal status: INITIAL   PLAN:  PT FREQUENCY:  3 visits  PT DURATION: other: over 5 weeks  PLANNED INTERVENTIONS: Therapeutic exercises, Therapeutic activity, Neuromuscular re-education, Balance training, Gait training, Patient/Family education, Self Care, Stair training, Manual therapy, and Re-evaluation  PLAN FOR NEXT SESSION: Review FOTO; assess response to HEP; progress therex as indicated; use of modalities, manual therapy.Freida Busman Teshara Moree MS, PT 07/17/23 5:50 PM

## 2023-07-16 ENCOUNTER — Ambulatory Visit: Payer: Medicare Other | Attending: Nurse Practitioner

## 2023-07-16 ENCOUNTER — Other Ambulatory Visit: Payer: Self-pay

## 2023-07-16 DIAGNOSIS — M6281 Muscle weakness (generalized): Secondary | ICD-10-CM | POA: Diagnosis not present

## 2023-07-16 DIAGNOSIS — I700 Atherosclerosis of aorta: Secondary | ICD-10-CM | POA: Insufficient documentation

## 2023-07-16 DIAGNOSIS — R29898 Other symptoms and signs involving the musculoskeletal system: Secondary | ICD-10-CM | POA: Diagnosis not present

## 2023-07-16 DIAGNOSIS — Z139 Encounter for screening, unspecified: Secondary | ICD-10-CM | POA: Insufficient documentation

## 2023-07-16 DIAGNOSIS — R262 Difficulty in walking, not elsewhere classified: Secondary | ICD-10-CM | POA: Insufficient documentation

## 2023-07-27 NOTE — Therapy (Signed)
OUTPATIENT PHYSICAL THERAPY TREATMENT   Patient Name: Emily Bentley MRN: [id number] DOB:1935/10/10, 88 y.o., female Today's Date: 07/28/2023  END OF SESSION:  PT End of Session - 07/28/23 1311     Visit Number 2    Number of Visits 4    Date for PT Re-Evaluation 08/20/23    Authorization Type MEDICARE PART A AND B; [employer] PPO    PT Start Time 1314    PT Stop Time 1401    PT Time Calculation (min) 47 min    Activity Tolerance Patient tolerated treatment well    Behavior During Therapy WFL for tasks assessed/performed              Past Medical History:  Diagnosis Date   Allergy    Arthritis    Asthma    Cataract    Chronic allergic rhinitis    with asthmatic component   Diverticulosis of colon (without mention of hemorrhage)    GERD (gastroesophageal reflux disease)    Hearing impairment    Hiatal hernia    History of bursitis    right shoulder   History of cochlear implant    Cant have an MRI   History of colon polyps    History of pneumonia    History of PSVT (paroxysmal supraventricular tachycardia)    Hormone replacement therapy (postmenopausal)    IBS (irritable bowel syndrome)    Peptic stricture of esophagus    Watermelon stomach    Past Surgical History:  Procedure Laterality Date   ABDOMINAL HYSTERECTOMY     APPENDECTOMY     CHOLECYSTECTOMY     COCHLEAR IMPLANT Left Oct '14   Done at [facility] - great results / Serial No: [id number] / Model: [id number]   ESOPHAGOGASTRODUODENOSCOPY     EYE SURGERY     POLYPECTOMY     TUBAL LIGATION     urethral dilatations     VENTRICULAR ABLATION SURGERY     Patient Active Problem List   Diagnosis Date Noted   Rash 11/19/2022   Otalgia of both ears 07/16/2022   Atypical chest pain 04/21/2022   Vertigo 01/28/2022   Palpitations 01/28/2022   Allergic rhinitis due to pollen 07/30/2021   Neck swelling 12/23/2020   Laryngopharyngeal reflux 10/28/2020   Vaginal prolapse 10/18/2020    Vitamin D deficiency 08/20/2020   Tingling 08/20/2020   Myalgia 08/20/2020   Bone pain 08/20/2020   Bilateral thoracic back pain 08/20/2020   Dysphagia 01/04/2020   Chronic bilateral thoracic back pain 09/28/2019   Scalp cyst 09/28/2019   Allergic rhinitis 09/28/2019   Constipation 08/25/2018   Elevated blood pressure reading in office without diagnosis of hypertension 08/25/2018   Anxiety 02/04/2018   Fatigue 12/15/2016   Degenerative disc disease, lumbar 09/16/2015   Routine general medical examination at a health care facility 07/02/2013   H/O supraventricular tachycardia 08/25/2012   Bilateral sensorineural hearing loss 07/28/2012   OAB (overactive bladder) 05/25/2012   Osteopenia 10/09/2009   Cochlear implant in place 12/30/2007   GERD (gastroesophageal reflux disease) 12/30/2007    PCP: Myrlene Broker, MD  REFERRING PROVIDER: Levi Aland, NP  REFERRING DIAG:  I70.0 (ICD-10-CM) - Aortic atherosclerosis (HCC) R29.898 (ICD-10-CM) - Weakness of both lower extremities Z13.9 (ICD-10-CM) - Encounter for risk and functional assessment of patient  THERAPY DIAG:  Muscle weakness (generalized)  Difficulty in walking, not elsewhere classified  Rationale for Evaluation and Treatment: Rehabilitation  ONSET DATE: Chronic  SUBJECTIVE:   Per eval -  Pt reports in the past 3 months a hx of 2 falls due to her becoming light headed after standing up from sitting. She has also experienced another fall related to tripping on a raise edge of a sidewalk. Additionally, she notes having LE weakness and when she has anxiety her they start to shake. Pt states having a hx LBP which bothers with prolonged standing and walking. Pt takes tylenol and tramadol for low back pain  SUBJECTIVE STATEMENT: 07/28/2023 Pt continues to endorse feeling about the same overall - activity tolerance limited by fatigue, blood pressure. She does note that her doctor gave her medication for her  orthostatic hypotension but she has had difficulty with adhering to it, as it makes her scalp itch.    PERTINENT HISTORY: History of cochlear implant  Hx SVT, osteopenia  PAIN:  Are you having pain?  Yes: NPRS scale: 0/10; 7/10 with prolonged standing Pain location: low back Pain description: ache Aggravating factors: prolonged standing and walking Relieving factors: Siting, lying down, tramadol, tylenol  PRECAUTIONS: Fall  RED FLAGS: None   WEIGHT BEARING RESTRICTIONS: No  FALLS:  Has patient fallen in last 6 months? Yes. Number of falls 3 2 related to syncope and 1 due to tripping  LIVING ENVIRONMENT: Lives with: lives alone Lives in: House/apartment Stairs: Yes: External: 1 steps; uses door frame Has following equipment at home: Single point cane does not use  OCCUPATION: Retired  PLOF: Independent  PATIENT GOALS: Bowling, walking - not since issues with light headedness  NEXT MD VISIT: 08/13/23  OBJECTIVE: (objective measures completed at initial evaluation unless otherwise dated)   DIAGNOSTIC FINDINGS: NA  PATIENT SURVEYS:  FOTO: 42% function; 56% predicted  COGNITION: Overall cognitive status: Within functional limits for tasks assessed     SENSATION: WFL  EDEMA:  None noted  MUSCLE LENGTH: Hamstrings: Right NT ; Left NT Thomas test: Right NT ; Left NT  POSTURE: rounded shoulders, forward head, and flexed trunk   PALPATION: NT  LOWER EXTREMITY ROM:  Grossly WNLs Active ROM Right eval Left eval  Hip flexion    Hip extension    Hip abduction    Hip adduction    Hip internal rotation    Hip external rotation    Knee flexion    Knee extension    Ankle dorsiflexion    Ankle plantarflexion    Ankle inversion    Ankle eversion     (Blank rows = not tested)  LOWER EXTREMITY MMT:  MMT Right eval Left eval  Hip flexion 4 4  Hip extension    Hip abduction 4 4  Hip adduction    Hip internal rotation    Hip external rotation 4 4   Knee flexion    Knee extension    Ankle dorsiflexion    Ankle plantarflexion    Ankle inversion    Ankle eversion     (Blank rows = not tested)  LOWER EXTREMITY SPECIAL TESTS:  NT  Orthostatic Hypotension Assessment:  Sitting: BP L arm 110/66  Standing BP L arm 88/50. Pt reported not experiencing light headedness  FUNCTIONAL TESTS:  Single leg stand= L 1", R 1"  Standing EC and feet apart= 20+"  Standing feet together= 1+ min GAIT: Distance walked: 263ft Assistive device utilized: None Level of assistance: Complete Independence Comments: WNLs   TREATMENT: [facility] Adult PT Treatment:  DATE: 07/28/23 Therapeutic Exercise: Seated marches x10 BIL cues for pacing Seated LAQ x10 BIL cues for pacing Seated heel/toe raises x10 BIL HEP establishment, handout, education on appropriate performance  Therapeutic Activity: Education on strategies to reduce fall risk such as pacing of activities, monitoring BP at home, HEP; also advised pt to reach out to provider re: BP readings and reported difficulty with medication adherence Orthostatics education/training with vitals monitoring (asymptomatic throughout) Sitting 111/81 HR 69bpm; standing 76/59, standing ~22min 79/56, return to sitting 125/79 Sitting 125/79, standing 108/90 after heel raises (x10), returns to 120/89 sitting Sitting 120/89, standing 86/60 (after therex program above), back to 119/76 in sitting  Standing 79/56, after walking 45ft 70/47 HR 88bpm, recovers to 118/75 HR 74bpm   [facility] Adult PT Treatment:                                                DATE: 06/29/2023 Self care: Fall prevention for orthostatic hypotension  PATIENT EDUCATION:  Education details: rationale for interventions, HEP, monitoring vitals, fall risk reduction strategies, communication w/ provider  Person educated: Patient Education method: Explanation, Demonstration, and Handouts Education comprehension:  verbalized understanding and returned demonstration  HOME EXERCISE PROGRAM: Access Code: [email or web address] URL: [email or web address] Date: 07/28/2023 Prepared by: Fransisco Hertz  Exercises - Seated March  - 2-3 x daily - 7 x weekly - 1 sets - 10 reps - Seated Long Arc Quad  - 2-3 x daily - 7 x weekly - 1 sets - 10 reps - Seated Heel Toe Raises  - 2-3 x daily - 7 x weekly - 1 sets - 10 reps  ASSESSMENT:  CLINICAL IMPRESSION: 07/28/2023 Pt arrived w/ report of continued fatigue and difficulty with blood pressure. Today we spent majority of session assessing response of BP to various activities, with attempts to incorporate strategies to mitigate orthostatic drop (see interventions section above). Her orthostatic drop was somewhat mitigated by seated exercise prior to standing, most positively affected by heel raises in standing. She did report some mild muscular fatigue in quads with LAQ, anticipate would benefit from strengthening in seated positions. However, given consistently low BP readings in standing and walking, it does seem that her orthostatic hypotension could be affecting her functional tolerance. Her BP consistently recovered quickly in sitting and she remained asymptomatic throughout entirety of session. Given her report of having difficulty adhering to prescribed medication (reports scalp itching that is very uncomfortable), advised pt to reach out to her cardiologist to discuss; she stated she will do so this afternoon. No adverse events, pt did not endorse any pain during session. HEP as above. Recommend continuing along current POC in order to address relevant deficits and improve functional tolerance. Pt departs today's session in no acute distress, all voiced questions/concerns addressed appropriately from PT perspective.     Per eval - Patient is a 88 y.o. F who was seen today for physical therapy evaluation and treatment for I70.0 (ICD-10-CM) - Aortic atherosclerosis  (HCC) R29.898 (ICD-10-CM) - Weakness of both lower extremities Z13.9 (ICD-10-CM) - Encounter for risk and functional assessment of patient Pt's presents with a Hx of recent falls with signs and symptoms most consistent with orthostatic hypotension. Upon standing from sitting, the pt's BP did drop, but pt did not experience light headedness today. Additionally, pt demonstrates decreased LE strength and single leg standing balance. Recommended a PT program  of 2w6 to improve LE strength and balance, however pt preferred to come in for a few sessions, 3x over 5 weeks, to develop a HEP to address these areas and for her to continue on her own.   OBJECTIVE IMPAIRMENTS: decreased activity tolerance, decreased balance, difficulty walking, decreased strength, and pain.   ACTIVITY LIMITATIONS: standing, bathing, dressing, locomotion level, and caring for others  PARTICIPATION LIMITATIONS: meal prep, cleaning, laundry, shopping, and community activity  PERSONAL FACTORS: Age, Past/current experiences, and Time since onset of injury/illness/exacerbation are also affecting patient's functional outcome.   REHAB POTENTIAL: Good  CLINICAL DECISION MAKING: Evolving/moderate complexity  EVALUATION COMPLEXITY: Moderate   GOALS: Goals reviewed with patient? No  SHORT TERM GOALS: Target date: 08/06/2023   Pt will be compliant and knowledgeable with initial HEP for improved comfort and carryover Baseline: To be developed Goal status: INITIAL  LONG TERM GOALS: Target date: 08/20/23  Pt will be Ind in a final HEP to maintain achieved LOF Baseline: To be developed Goal status: INITIAL  2.  Increase bilat LE strength to 4+/5 to assist with improved balance Baseline: 4/5 Goal status: INITIAL  3.  Increase single leg standing time bilat to 8' or greater for improved balance Baseline: Bilat 1" Goal status: INITIAL  4.  Pt will be able to recall 3 measures to prevent future falls Baseline: Reviewed  orthostatic hypotension precautions Goal status: INITIAL  5.  Pt's FOTO score will improved to the predicted value of 56% as indication of improved function  Baseline: 42% Goal status: INITIAL   PLAN:  PT FREQUENCY:  3 visits  PT DURATION: other: over 5 weeks  PLANNED INTERVENTIONS: Therapeutic exercises, Therapeutic activity, Neuromuscular re-education, Balance training, Gait training, Patient/Family education, Self Care, Stair training, Manual therapy, and Re-evaluation  PLAN FOR NEXT SESSION: seated strengthening for BIL LE; close monitoring of vitals when standing/mobilizing, education on fall risk reduction strategies particularly in context of orthostatics.    Ashley Murrain PT, DPT 07/28/2023 2:28 PM

## 2023-07-28 ENCOUNTER — Encounter: Payer: Self-pay | Admitting: Physical Therapy

## 2023-07-28 ENCOUNTER — Ambulatory Visit: Payer: Medicare Other | Admitting: Physical Therapy

## 2023-07-28 DIAGNOSIS — R29898 Other symptoms and signs involving the musculoskeletal system: Secondary | ICD-10-CM | POA: Diagnosis not present

## 2023-07-28 DIAGNOSIS — R262 Difficulty in walking, not elsewhere classified: Secondary | ICD-10-CM | POA: Diagnosis not present

## 2023-07-28 DIAGNOSIS — I700 Atherosclerosis of aorta: Secondary | ICD-10-CM | POA: Diagnosis not present

## 2023-07-28 DIAGNOSIS — M6281 Muscle weakness (generalized): Secondary | ICD-10-CM | POA: Diagnosis not present

## 2023-07-28 DIAGNOSIS — Z139 Encounter for screening, unspecified: Secondary | ICD-10-CM | POA: Diagnosis not present

## 2023-07-31 ENCOUNTER — Encounter: Payer: Self-pay | Admitting: Internal Medicine

## 2023-08-06 ENCOUNTER — Ambulatory Visit: Payer: Medicare Other | Admitting: Physical Therapy

## 2023-08-09 ENCOUNTER — Ambulatory Visit: Payer: Self-pay | Admitting: Licensed Clinical Social Worker

## 2023-08-09 NOTE — Patient Outreach (Signed)
Care Coordination   Follow Up Visit Note   08/09/2023 Name: Emily Bentley MRN: [id number] DOB: [birth year]  Emily Bentley is a 88 y.o. year old female who sees Myrlene Broker, MD for primary care. I spoke with  [name] by phone today.  What matters to the patients health and wellness today? Patient wants to live independently and receive support if needed     Goals Addressed             This Visit's Progress    Patient wants to live independently and receive support if needed       Interventions:  Spoke with client via phone today about client needs.  Client has ongoing support from her granddaughter, Leverne Humbles. Discussed walking of client. Client gets dizzy occasionally. Some difficulty in walking. She said she may buy a cane to help her with walking needs Discussed energy level. Client said she has decreased energy from time to time Discussed support client receives from granddaughter, Leverne Humbles Discussed transport needs. Client said she has transport help from family members. No transport issues at this time. R. Reviewed sleeping issues of client. She said she is sleeping more recently. Discussed support of PCP, Dr. Hillard Danker. Client spoke of an upcoming appointment of client with Dr. Okey Dupre Client is concerned about her blood pressure readings.  She said when she is sitting , her blood pressure is more in normal range. But, when she stands up, her BP can vary. LCSW has previously encouraged client to talk with PCP or nurse of PCP about her BP symptoms and about her feelings of dizziness  Provided counseling support for client Spoke about client physical therapy sessions. She has been receiving physical therapy sessions as scheduled each week. She thinks these physical therapy sessions have been helpful to her She said she is scheduled to have appointment with cardiologist next week Thanked client for phone call today with LCSW Encouraged  client or Leverne Humbles to call LCSW as needed for SW support  for client at [phone]          SDOH assessments and interventions completed:  Yes  SDOH Interventions Today    Flowsheet Row Most Recent Value  SDOH Interventions   Depression Interventions/Treatment  Counseling  Physical Activity Interventions Other (Comments)  [receiving physical therapy sessions as scheduled each week]  Stress Interventions Provide Counseling  [has stress in managing blood pressure issues.]        Care Coordination Interventions:  Yes, provided   Interventions Today    Flowsheet Row Most Recent Value  Chronic Disease   Chronic disease during today's visit Other  [spoke with client about client needs]  General Interventions   General Interventions Discussed/Reviewed General Interventions Discussed, Community Resources  Exercise Interventions   Exercise Discussed/Reviewed Physical Activity  [she said she gets dizzy sometimes. She said she was planning on buying a cane to help her with walking]  Physical Activity Discussed/Reviewed Physical Activity Discussed  [fall risk,  gets dizzy sometimes]  Education Interventions   Education Provided Provided Education  Provided Verbal Education On Community Resources  Mental Health Interventions   Mental Health Discussed/Reviewed Coping Strategies  [has some stress in managing medical needs]  Nutrition Interventions   Nutrition Discussed/Reviewed Nutrition Discussed  Pharmacy Interventions   Pharmacy Dicussed/Reviewed Pharmacy Topics Discussed  Safety Interventions   Safety Discussed/Reviewed Fall Risk        Follow up plan: Follow up call scheduled for 09/29/23 at 10:00  AM    Encounter Outcome:  Patient Visit Completed   Kelton Pillar.Tylar Amborn MSW, LCSW Licensed Visual merchandiser [facility] Care Management [phone]

## 2023-08-09 NOTE — Patient Instructions (Signed)
Visit Information  Thank you for taking time to visit with me today. Please don't hesitate to contact me if I can be of assistance to you.   Following are the goals we discussed today:   Goals Addressed             This Visit's Progress    Patient wants to live independently and receive support if needed       Interventions:  Spoke with client via phone today about client needs.  Client has ongoing support from her granddaughter, Emily Bentley. Discussed walking of client. Client gets dizzy occasionally. Some difficulty in walking. She said she may buy a cane to help her with walking needs Discussed energy level. Client said she has decreased energy from time to time Discussed support client receives from granddaughter, Emily Bentley Discussed transport needs. Client said she has transport help from family members. No transport issues at this time.  Reviewed sleeping issues of client. She said she is sleeping more recently. Discussed support of PCP, Dr. Hillard Danker. Client spoke of an upcoming appointment of client with Dr. Okey Dupre Client is concerned about her blood pressure readings.  She said when she is sitting , her blood pressure is more in normal range. But, when she stands up, her BP can vary. LCSW has previously encouraged client to talk with PCP or nurse of PCP about her BP symptoms and about her feelings of dizziness  Provided counseling support for client Spoke about client physical therapy sessions. She has been receiving physical therapy sessions as scheduled each week. She thinks these physical therapy sessions have been helpful to her She said she is scheduled to have appointment with cardiologist next week Thanked client for phone call today with LCSW Encouraged client or Emily Bentley to call LCSW as needed for SW support  for client at [phone]          Our next appointment is by telephone on 09/29/23 at 10:00 AM   Please call the care guide  team at [phone] if you need to cancel or reschedule your appointment.   If you are experiencing a Mental Health or Behavioral Health Crisis or need someone to talk to, please go to [facility] [address] [place] [phone])   The patient verbalized understanding of instructions, educational materials, and care plan provided today and DECLINED offer to receive copy of patient instructions, educational materials, and care plan.   The patient has been provided with contact information for the care management team and has been advised to call with any health related questions or concerns.   Emily Bentley MSW, LCSW Licensed Visual merchandiser [facility] Care Management [phone]

## 2023-08-12 NOTE — Progress Notes (Signed)
Cardiology Office Note:  .   Date:  08/13/2023  ID:  Emily Bentley, DOB 1934-12-14, MRN [id number] PCP: Myrlene Broker, MD  [facility] Providers Cardiologist:  Lance Muss, MD    Patient Profile: .      PMH: Atypical chest pain CAD Coronary CTA 09/11/22 with coronary calcium score 940 (93rd percentile), mild CAD in mid left circumflex, first diagonal and posterior lateral artery, 25 to 49% stenosis, CADRADS2 Anxiety Palpitations SVT s/p ablation 2005 Aortic atherosclerosis Pulmonary artery dilation 43 mm, thickened pulmonary valve  Referred to cardiology for evaluation of atypical chest pain and seen by Dr. Eldridge Dace on 05/13/22. Records from PCP show "worsening anxiety and chest pains lasting 30-60 minutes resolve with rest or tramadol in the side and into neck area. Prior SVT s/p ablation but does not feel similar.  She reported worsening anxiety and an episode of palpitations during a dental cleaning.  Coronary CT was ordered for mapping of coronary anatomy and was completed 09/11/22. CT revealed CAC score 940, 93rd percentile, mild CAD in the mid left circumflex, first diagonal, and posterolateral arteries, 25 to 49% stenoses, severe dilation of the main pulmonary artery, 43 mm, suggesting elevated pulmonary pressures with thickened pulmonary valve, right ventricular enlargement.  She was asked about symptoms of sleep apnea and denied.  Was advised to start rosuvastatin 10 mg daily and return for follow-up in 1 year.   Seen by me on 06/11/23 for presyncope. She has not experienced syncope. A friend drove her today because of low blood pressure. Was found to have orthostatic hypotension at PCP visit 05/24/23 with decrease in BP from 126/70 lying to 82/60 standing. Was feeling like she was going to pass out, most noticeably when going from sitting to standing in the sunlight. Initially noticed this about 3 years ago but it has gotten worse. States it "feels like a shade comes  down over her eyes, and then rises up." Reports good hydration with water, admits to drinking soda frequently but consumes about 40+ ounces water daily. Is using a syringe of warm water to help move her bowels.  Admits she can go long periods without eating, does not get very hungry. Feels that she has developed confusion associated with low BP. No visual abnormalities per ophthalmology. No chest pain, shortness of breath, palpitations, orthopnea, PND. Reports leg weakness that is consistent, is not associated only with episodes of orthostasis. No abnormalities on recent lab work. We referred her for PT evaluation and started midodrine 5 mg three times daily.        History of Present Illness: .   Emily Bentley is a very pleasant 88 y.o. female who is here today for follow-up of orthostatic hypotension and is accompanied by her granddaughter. She reports itching and a rash around the waistline, which she attributes to Midodrine. The itching is dose-dependent, with more severe itching at higher doses. Despite the side effects, the patient reports that Midodrine does help with her dizziness. She was taking one tablet in the morning, one in the early afternoon, and one in the evening but reduced it to taking one daily due to itching. She has not had any syncope or falls. Reports improvement in her leg strength and walking ability, which she attributes to physical therapy. She has been monitoring her blood pressure at home and has noticed some low readings, particularly when she has not eaten. Her family is concerned that she is not eating enough. She reports occasional shortness of  breath, which has not worsened. No orthopnea, PND, or edema. Has been managing constipation with Miralax and Colace, and reports that her bowel movements are better. She occasionally experiences a stinging sensation in her chest, which she believes may be related to straining. She denies any chest pain while performing physical  activities.   ROS: See HPI       Studies Reviewed: .         Risk Assessment/Calculations:             Physical Exam:   VS:  BP (!) 98/58 (BP Location: Left Arm, Patient Position: Sitting, Cuff Size: Normal)   Pulse 73   Ht 5\' 2"  (1.575 m)   Wt 163 lb 6.4 oz (74.1 kg)   SpO2 96%   BMI 29.89 kg/m    Wt Readings from Last 3 Encounters:  08/13/23 163 lb 6.4 oz (74.1 kg)  06/11/23 165 lb 3.2 oz (74.9 kg)  05/24/23 164 lb (74.4 kg)    GEN: Well nourished, well developed in no acute distress NECK: No JVD; No carotid bruits CARDIAC: RRR, no murmurs, rubs, gallops RESPIRATORY:  Clear to auscultation without rales, wheezing or rhonchi  ABDOMEN: Soft, non-tender, non-distended EXTREMITIES:  No edema; No deformity     ASSESSMENT AND PLAN: .    Presyncope/Orthostatic hypotension: Frequent episodes of orthostatic hypotension/presyncope. No syncope for falls. Positive orthostatic vital signs at PCP as well as here. She was started on midodrine 5 mg 3 times daily.  Unfortunately she has had head itching about 45 minutes after taking midodrine.  She has reduced it to once daily.  Says the itching is tolerable. We will reduce midodrine to 2.5 mg TID to see if this dose causes less side effects. Emphasized the importance of protein at each meal and avoid prolonged periods of fasting.   Leg weakness: Reports leg weakness has improved with physical therapy. No falls. Continue leg strengthening exercises.  Consider use of walker or cane for additional stability.   CAD without angina: Mild nonobstructive CAD in LCx, first diagonal, and posterolateral artery on CCTA 09/11/22. Occasional chest "stings" that do not sound consistent with angina. Occasional shortness of breath that she feels is stable, no dyspnea. No indication for further ischemic evaluation at this time. LDL cholesterol well controlled. Continue rosuvastatin.   Constipation: Improved with Miralax and Colace.   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  Stable, possibly secondary to elevated pulmonary pressures noted on previous imaging. No significant dyspnea. We will get an echocardiogram to better assess cardiac function and pulmonary pressures.  Hyperlipidemia/Aortic atherosclerosis: LDL 45 on 12/22/22.  Well-controlled.  Continue rosuvastatin.       Dispo: 6 months with me  Signed, Eligha Bridegroom, NP-C

## 2023-08-13 ENCOUNTER — Encounter: Payer: Self-pay | Admitting: Nurse Practitioner

## 2023-08-13 ENCOUNTER — Ambulatory Visit: Payer: Medicare Other | Attending: Nurse Practitioner | Admitting: Nurse Practitioner

## 2023-08-13 VITALS — BP 98/58 | HR 73 | Ht 62.0 in | Wt 163.4 lb

## 2023-08-13 DIAGNOSIS — I2510 Atherosclerotic heart disease of native coronary artery without angina pectoris: Secondary | ICD-10-CM | POA: Diagnosis not present

## 2023-08-13 DIAGNOSIS — R29898 Other symptoms and signs involving the musculoskeletal system: Secondary | ICD-10-CM | POA: Insufficient documentation

## 2023-08-13 DIAGNOSIS — K5900 Constipation, unspecified: Secondary | ICD-10-CM | POA: Diagnosis not present

## 2023-08-13 DIAGNOSIS — R55 Syncope and collapse: Secondary | ICD-10-CM | POA: Insufficient documentation

## 2023-08-13 DIAGNOSIS — I272 Pulmonary hypertension, unspecified: Secondary | ICD-10-CM | POA: Diagnosis not present

## 2023-08-13 DIAGNOSIS — E785 Hyperlipidemia, unspecified: Secondary | ICD-10-CM | POA: Diagnosis not present

## 2023-08-13 DIAGNOSIS — R0609 Other forms of dyspnea: Secondary | ICD-10-CM | POA: Diagnosis not present

## 2023-08-13 DIAGNOSIS — I951 Orthostatic hypotension: Secondary | ICD-10-CM | POA: Diagnosis not present

## 2023-08-13 DIAGNOSIS — I700 Atherosclerosis of aorta: Secondary | ICD-10-CM | POA: Diagnosis not present

## 2023-08-13 MED ORDER — MIDODRINE HCL 2.5 MG PO TABS: 2.5000 mg | ORAL_TABLET | Freq: Three times a day (TID) | ORAL | Status: AC

## 2023-08-13 NOTE — Patient Instructions (Signed)
Medication Instructions:  Decrease midodrine to 2.5 mg 3 times daily - CUT your 5 mg tablets in half **Call us or send a message if you need the 2.5 mg tablets  *If you need a refill on your cardiac medications before your next appointment, please call your pharmacy*   Lab Work: None Ordered If you have labs (blood work) drawn today and your tests are completely normal, you will receive your results only by: MyChart Message (if you have MyChart) OR A paper copy in the mail If you have any lab test that is abnormal or we need to change your treatment, we will call you to review the results.   Testing/Procedures: Your physician has requested that you have an echocardiogram. Echocardiography is a painless test that uses sound waves to create images of your heart. It provides your doctor with information about the size and shape of your heart and how well your heart's chambers and valves are working. This procedure takes approximately one hour. There are no restrictions for this procedure. Please do NOT wear cologne, perfume, aftershave, or lotions (deodorant is allowed). Please arrive 15 minutes prior to your appointment time.    Follow-Up: At [facility], you and your health needs are our priority.  As part of our continuing mission to provide you with exceptional heart care, we have created designated Provider Care Teams.  These Care Teams include your primary Cardiologist (physician) and Advanced Practice Providers (APPs -  Physician Assistants and Nurse Practitioners) who all work together to provide you with the care you need, when you need it.  We recommend signing up for the patient portal called "MyChart".  Sign up information is provided on this After Visit Summary.  MyChart is used to connect with patients for Virtual Visits (Telemedicine).  Patients are able to view lab/test results, encounter notes, upcoming appointments, etc.  Non-urgent messages can be sent to your  provider as well.   To learn more about what you can do with MyChart, go to [email or web address].    Your next appointment:   6 month(s)  Provider:   Eligha Bridegroom, NP

## 2023-08-20 ENCOUNTER — Ambulatory Visit: Payer: Medicare Other | Admitting: Physical Therapy

## 2023-08-25 ENCOUNTER — Ambulatory Visit: Payer: Medicare Other | Admitting: Physical Therapy

## 2023-08-27 ENCOUNTER — Other Ambulatory Visit: Payer: Self-pay | Admitting: Internal Medicine

## 2023-09-01 ENCOUNTER — Ambulatory Visit: Payer: Medicare Other

## 2023-09-06 ENCOUNTER — Ambulatory Visit (HOSPITAL_COMMUNITY): Payer: Medicare Other | Attending: Nurse Practitioner

## 2023-09-06 DIAGNOSIS — R0609 Other forms of dyspnea: Secondary | ICD-10-CM | POA: Diagnosis not present

## 2023-09-06 DIAGNOSIS — I272 Pulmonary hypertension, unspecified: Secondary | ICD-10-CM

## 2023-09-06 DIAGNOSIS — Z1231 Encounter for screening mammogram for malignant neoplasm of breast: Secondary | ICD-10-CM | POA: Diagnosis not present

## 2023-09-06 LAB — ECHOCARDIOGRAM COMPLETE
Area-P 1/2: 3.75 cm2
P 1/2 time: 486 ms
S' Lateral: 2.3 cm

## 2023-09-06 LAB — HM MAMMOGRAPHY

## 2023-09-07 ENCOUNTER — Encounter: Payer: Self-pay | Admitting: Internal Medicine

## 2023-09-29 ENCOUNTER — Ambulatory Visit: Payer: Self-pay | Admitting: Licensed Clinical Social Worker

## 2023-09-29 NOTE — Patient Outreach (Signed)
  Care Coordination   Follow Up Visit Note   09/29/2023 Name: Emily Bentley MRN: [id number] DOB: Nov 19, 1934  Emily Bentley is a 88 y.o. year old female who sees Myrlene Broker, MD for primary care. I spoke with  Emily Bentley / Emily Bentley, granddaughter of client, via phone today.  What matters to the patients health and wellness today? Patient wants to live independently and receive support if  needed    Goals Addressed             This Visit's Progress    Patient wants to live independently and receive support if needed       Interventions:  Spoke with Emily Bentley, granddaughter of client, via phone today about client needs and status Discussed walking of client. Client gets dizzy occasionally. Some difficulty in walking.  Discussed energy level. Client gets fatigued and short of breath occasionally. Client has to take periodic rest breaks as needed.  Discussed support client receives from granddaughter, Emily Bentley Reviewed sleeping issues of client.  Discussed support of PCP, Dr. Hillard Danker.  Emily Bentley said that family is watching blood pressure readings of client. Also she said client gets dizzy occasionally Discussed social support. Emily Bentley said that a friend of client is visiting client often. Friend and client sometimes   go for short walk.  Friend of client visits client in home of client regularly Discussed pain issues of client Discussed mood of client. No mood issues noted  Reece Leader for phone call today with LCSW Encouraged client or Emily Bentley to call LCSW as needed for SW support  for client at [phone]          SDOH assessments and interventions completed:  Yes  SDOH Interventions Today    Flowsheet Row Most Recent Value  SDOH Interventions   Depression Interventions/Treatment  Counseling  Physical Activity Interventions Other (Comments)  [some mobility challenges]  Stress Interventions Other (Comment)  [has  some stress in managing medical needs]        Care Coordination Interventions:  Yes, provided  Interventions Today    Flowsheet Row Most Recent Value  Chronic Disease   Chronic disease during today's visit Other  [spoke with Emily Bentley about needs of client]  General Interventions   General Interventions Discussed/Reviewed General Interventions Discussed, Community Resources  Exercise Interventions   Exercise Discussed/Reviewed Physical Activity  Education Interventions   Education Provided Provided Education  Provided Verbal Education On [employer]  Mental Health Interventions   Mental Health Discussed/Reviewed --  [no mood issues noted]  Nutrition Interventions   Nutrition Discussed/Reviewed Nutrition Discussed  [client has decreased appetite]  Pharmacy Interventions   Pharmacy Dicussed/Reviewed Pharmacy Topics Discussed        Follow up plan: Follow up call scheduled for 11/15/23 at 11:00 AM    Encounter Outcome:  Patient Visit Completed   Kelton Pillar.Pearly Apachito MSW, LCSW Licensed Visual merchandiser [facility] Care Management [phone]

## 2023-09-29 NOTE — Patient Instructions (Signed)
Visit Information  Thank you for taking time to visit with me today. Please don't hesitate to contact me if I can be of assistance to you.   Following are the goals we discussed today:   Goals Addressed             This Visit's Progress    Patient wants to live independently and receive support if needed       Interventions:  Spoke with Leverne Humbles, granddaughter of client, via phone today about client needs and status Discussed walking of client. Client gets dizzy occasionally. Some difficulty in walking.  Discussed energy level. Client gets fatigued and short of breath occasionally. Client has to take periodic rest breaks as needed.  Discussed support client receives from granddaughter, Leverne Humbles Reviewed sleeping issues of client.  Discussed support of PCP, Dr. Hillard Danker.  Francena Hanly said that family is watching blood pressure readings of client. Also she said client gets dizzy occasionally Discussed social support. Francena Hanly said that a friend of client is visiting client often. Friend and client sometimes   go for short walk.  Friend of client visits client in home of client regularly Discussed pain issues of client Discussed mood of client. No mood issues noted  Reece Leader for phone call today with LCSW Encouraged client or Leverne Humbles to call LCSW as needed for SW support  for client at [phone]          Our next appointment is by telephone on 11/15/23 at 11:00 AM   Please call the care guide team at [phone] if you need to cancel or reschedule your appointment.   If you are experiencing a Mental Health or Behavioral Health Crisis or need someone to talk to, please go to [facility] [address] [place] [phone])   The patient / Leverne Humbles, granddaughter, verbalized understanding of instructions, educational materials, and care plan provided today and DECLINED offer to receive copy of patient  instructions, educational materials, and care plan.   The patient / Leverne Humbles, granddaughter, has been provided with contact information for the care management team and has been advised to call with any health related questions or concerns.   Kelton Pillar.[facility] MSW, LCSW Licensed Visual merchandiser [facility] Care Management [phone]

## 2023-10-21 ENCOUNTER — Ambulatory Visit: Payer: Medicare Other | Admitting: Internal Medicine

## 2023-11-05 ENCOUNTER — Encounter: Payer: Self-pay | Admitting: Internal Medicine

## 2023-11-08 ENCOUNTER — Other Ambulatory Visit: Payer: Self-pay

## 2023-11-08 MED ORDER — ESCITALOPRAM OXALATE 10 MG PO TABS: 10.0000 mg | ORAL_TABLET | Freq: Every day | ORAL | 1 refills | Status: DC

## 2023-11-11 ENCOUNTER — Encounter: Payer: Self-pay | Admitting: Physical Therapy

## 2023-11-15 ENCOUNTER — Ambulatory Visit: Payer: Self-pay | Admitting: Licensed Clinical Social Worker

## 2023-11-15 NOTE — Patient Outreach (Signed)
  Care Coordination   Follow Up Visit Note   11/15/2023 Name: Emily Bentley MRN: [id number] DOB: January 07, 1935  Emily Bentley is a 89 y.o. year old female who sees Emily Almarie LABOR, MD for primary care. I spoke with  Emily Bentley by phone today.  What matters to the patients health and wellness today? Patient wants to live independently and receive support if needed    Goals Addressed             This Visit's Progress    Patient wants to live independently and receive support if needed       Interventions:  Spoke with  client, via phone today, about client needs Spoke with client about family support. She has support from her granddaughter, Emily Bentley. She has support of her daughter. She has support from her friends Discussed transport needs. She drives herself to appointment and to do errands as needed Discussed appointments. She has appointment scheduled with PCP tomorrow at 11:00 AM.  She is concerned about weather forecast. She plans to call office of PCP and talk with representative about her appointment scheduled for tomorrow. She said she may try to reschedule that appointment  with PCP Discussed walking of client. Client gets dizzy occasionally. Some difficulty in walking. She is not using a device to help her walk Discussed energy level. Client gets fatigued and short of breath occasionally. Client has to take periodic rest breaks as needed.  Dicussed sleeping issues of client.  Discussed support of PCP, Dr. Almarie Emily.  Discussed pain issues of client Discussed mood of client. She said she gets anxious occasionally. She said she is taking Lexapro  as prescribed Thanked Emily Bentley for phone call today with LCSW Encouraged client or Emily Bentley to call LCSW as needed for SW support  for client at [phone]          SDOH assessments and interventions completed:  Yes  SDOH Interventions Today    Flowsheet Row Most Recent Value  SDOH  Interventions   Depression Interventions/Treatment  Counseling  Physical Activity Interventions Other (Comments)  [risk of fall,  has to take rest breaks occasionally.]  Stress Interventions Provide Counseling  [stress in managing daily needs. stress in managing medical needs]        Care Coordination Interventions:  Yes, provided   Interventions Today    Flowsheet Row Most Recent Value  Chronic Disease   Chronic disease during today's visit Other  [spoke with client about client needs]  General Interventions   General Interventions Discussed/Reviewed General Interventions Discussed, Community Resources  Education Interventions   Education Provided Provided Education  Provided Engineer, [employer] On [employer]  Mental Health Interventions   Mental Health Discussed/Reviewed Coping Strategies  [spoke with client about coping with anxiety. She is taking medications as prescribed]  Nutrition Interventions   Nutrition Discussed/Reviewed Nutrition Discussed  Pharmacy Interventions   Pharmacy Dicussed/Reviewed Pharmacy Topics Discussed  Safety Interventions   Safety Discussed/Reviewed Fall Risk        Follow up plan: Follow up call scheduled for 01/11/24 at 1:30 PM     Encounter Outcome:  Patient Visit Completed   Ozell RAMAN.Lindyn Vossler MSW, LCSW Licensed Visual Merchandiser [facility] Care Management [phone]

## 2023-11-15 NOTE — Patient Instructions (Signed)
 Visit Information  Thank you for taking time to visit with me today. Please don't hesitate to contact me if I can be of assistance to you.   Following are the goals we discussed today:   Goals Addressed             This Visit's Progress    Patient wants to live independently and receive support if needed       Interventions:  Spoke with  client, via phone today, about client needs Spoke with client about family support. She has support from her granddaughter, Emily Bentley. She has support of her daughter. She has support from her friends Discussed transport needs. She drives herself to appointment and to do errands as needed Discussed appointments. She has appointment scheduled with PCP tomorrow at 11:00 AM.  She is concerned about weather forecast. She plans to call office of PCP and talk with representative about her appointment scheduled for tomorrow. She said she may try to reschedule that appointment  with PCP Discussed walking of client. Client gets dizzy occasionally. Some difficulty in walking. She is not using a device to help her walk Discussed energy level. Client gets fatigued and short of breath occasionally. Client has to take periodic rest breaks as needed.  Dicussed sleeping issues of client.  Discussed support of PCP, Dr. Almarie Cleveland.  Discussed pain issues of client Discussed mood of client. She said she gets anxious occasionally. She said she is taking Lexapro  as prescribed Thanked [name] for phone call today with LCSW Encouraged client or Emily Bentley to call LCSW as needed for SW support  for client at [phone]          Our next appointment is by telephone on 01/11/24 at 1:30 PM   Please call the care guide team at [phone] if you need to cancel or reschedule your appointment.   If you are experiencing a Mental Health or Behavioral Health Crisis or need someone to talk to, please go to [facility]  [address] [place] [phone])   The patient verbalized understanding of instructions, educational materials, and care plan provided today and DECLINED offer to receive copy of patient instructions, educational materials, and care plan.   The patient has been provided with contact information for the care management team and has been advised to call with any health related questions or concerns.   Emily Bentley MSW, LCSW Licensed Visual Merchandiser [facility] Care Management [phone]

## 2023-11-16 ENCOUNTER — Ambulatory Visit: Payer: Medicare Other | Admitting: Internal Medicine

## 2024-01-11 ENCOUNTER — Ambulatory Visit: Payer: Self-pay | Admitting: Licensed Clinical Social Worker

## 2024-01-11 NOTE — Patient Instructions (Signed)
 Visit Information  Thank you for taking time to visit with me today. Please don't hesitate to contact me if I can be of assistance to you.   Following are the goals we discussed today:   Goals Addressed             This Visit's Progress    Patient wants to maintain current actiities. Client wants to complete daily ADLs. Client wants to manage anxiety issues.       Interventions:  LCSW spoke via phone today with client about client needs and status Used Active Listening Techniques to allow client to share her feelings Discussed medication procurement for client. Discussed transport needs. Client drives her car as needed to appointments and to complete errands. Discussed client support with PCP, Dr. Hillard Danker. Client said she had been more anxious recently due to her concern over health issues of her daughter. Her daughter is scheduled for a cancer surgery procedure this Thursday; client is concerned about health of her daughter. Shandi said that some of her relatives are planning to visit with her starting tomorrow and through surgery time of her daughter. Caterina is glad she will have support of her relatives during this time Discussed pain issues of client. Discussed sleeping issues of client Client said she gets short of breath occasionally. She said she has to take rest breaks occasionally Client and LCSW spoke of client mood. Client is taking Lexapro as prescribed. Client said she does get anxious from time to time Client likes to walk for exercise. She has a cane she uses to help her walk Thanked client for phone call with LCSW today Encouraged client to call LCSW as needed for SW support for client at [phone] Client was appreciative of call from LCSW today         Our next appointment is by telephone on 02/07/24 at 10:30 AM   Please call the care guide team at [phone] if you need to cancel or reschedule your appointment.   If you are experiencing a Mental  Health or Behavioral Health Crisis or need someone to talk to, please go to [facility] [address] [place] [phone])   The patient verbalized understanding of instructions, educational materials, and care plan provided today and DECLINED offer to receive copy of patient instructions, educational materials, and care plan.   The patient has been provided with contact information for the care management team and has been advised to call with any health related questions or concerns.    Lorna Few  MSW, LCSW [facility] Licensed Clinical Social Worker Direct Dial:  [phone] Fax:  [phone] Website:  [email or web address]

## 2024-01-11 NOTE — Patient Outreach (Signed)
 Care Coordination   Follow Up Visit Note   01/11/2024 Name: Emily Bentley MRN: [id number] DOB: 26-Aug-1935  Emily Bentley is a 89 y.o. year old female who sees Emily Broker, MD for primary care. I spoke with  Emily Bentley by phone today.  What matters to the patients health and wellness today?  Patient wants to maintain current actiities. Client wants to complete daily ADLs. Client wants to manage anxiety issues.     Goals Addressed             This Visit's Progress    Patient wants to maintain current actiities. Client wants to complete daily ADLs. Client wants to manage anxiety issues.       Interventions:  LCSW spoke via phone today with client about client needs and status Used Active Listening Techniques to allow client to share her feelings Discussed medication procurement for client. Discussed transport needs. Client drives her car as needed to appointments and to complete errands. Discussed client support with PCP, Emily Bentley. Client said she had been more anxious recently due to her concern over health issues of her daughter. Her daughter is scheduled for a cancer surgery procedure this Thursday; client is concerned about health of her daughter. Emily Bentley said that some of her relatives are planning to visit with her starting tomorrow and through surgery time of her daughter. Emily Bentley is glad she will have support of her relatives during this time Discussed pain issues of client. Discussed sleeping issues of client Client said she gets short of breath occasionally. She said she has to take rest breaks occasionally Client and LCSW spoke of client mood. Client is taking Lexapro as prescribed. Client said she does get anxious from time to time Client likes to walk for exercise. She has a cane she uses to help her walk Thanked client for phone call with LCSW today Encouraged client to call LCSW as needed for SW support for client at [phone] Client was  appreciative of call from LCSW today           SDOH assessments and interventions completed:  Yes  SDOH Interventions Today    Flowsheet Row Most Recent Value  SDOH Interventions   Depression Interventions/Treatment  Counseling  Physical Activity Interventions Other (Comments)  [has some mobility challenges]  Stress Interventions Provide Counseling  [client has stress in managing her medical needs. She is anxious concerning health needs of her daughter]        Care Coordination Interventions:  Yes, provided   Interventions Today    Flowsheet Row Most Recent Value  Chronic Disease   Chronic disease during today's visit Other  [spoke with client about client needs]  General Interventions   General Interventions Discussed/Reviewed General Interventions Discussed, Community Resources  Education Interventions   Education Provided Provided Education  Provided Verbal Education On [employer]  Mental Health Interventions   Mental Health Discussed/Reviewed Coping Strategies  [client has anxiety and stress issues. She is anxious regarding the medical needs of her daughter]  Nutrition Interventions   Nutrition Discussed/Reviewed Nutrition Discussed  Pharmacy Interventions   Pharmacy Dicussed/Reviewed Pharmacy Topics Discussed  Safety Interventions   Safety Discussed/Reviewed Fall Risk        Follow up plan: Follow up call scheduled for 02/07/24 at 10:30 AM     Encounter Outcome:  Patient Visit Completed    Emily Bentley  MSW, LCSW [place] Licensed Clinical Social Worker Direct Dial:  [phone]  Fax:  [phone] Website:  [email or web address]

## 2024-02-07 ENCOUNTER — Ambulatory Visit: Payer: Self-pay | Admitting: Licensed Clinical Social Worker

## 2024-02-07 NOTE — Patient Outreach (Signed)
 Care Coordination   Follow Up Visit Note   02/07/2024 Name: Emily Bentley MRN: [id number] DOB: [birth year]  Emily Bentley is a 89 y.o. year old female who sees Myrlene Broker, MD for primary care. I spoke with  Emily Bentley by phone today.  What matters to the patients health and wellness today?  Patient wants to live Independently and receive support if needed     Goals Addressed             This Visit's Progress    Patient wants to live independently and receive support if needed       Interventions:  Spoke with  client, via phone today, about client needs Spoke with client about family support. She has support from her granddaughter, Emily Bentley. She has support of her daughter. She has support from her friends Discussed transport needs. She drives herself to appointment and to do errands as needed Discussed walking of client. Client gets dizzy occasionally. She does use a cane to help her walk Client said she does get fatigued and short of breath occasionally. Client has to take periodic rest breaks as needed. She said she may get short of breath when cleaning her home Dicussed sleeping issues of client. Discussed pain issues of client  Discussed support of PCP, Emily Bentley.  Discussed meal provision for client. She said she may go out to eat or have meal delivered to her home. She said she does not cook very often Discussed mood of client. She said she gets anxious occasionally. She said she is taking Lexapro as prescribed Discussed vision needs. She said she uses reading glasses to help her read Emily Bentley for phone call today with LCSW Encouraged client or Emily Bentley to call LCSW as needed for SW support  for client at [phone]          SDOH assessments and interventions completed:  Yes  SDOH Interventions Today    Flowsheet Row Most Recent Value  SDOH Interventions   Depression Interventions/Treatment  Counseling   Physical Activity Interventions Other (Comments)  [client uses a cane to help her walk]  Stress Interventions Provide Counseling  [client has stress in managing medical needs]        Care Coordination Interventions:  Yes, provided   Interventions Today    Flowsheet Row Most Recent Value  Chronic Disease   Chronic disease during today's visit Other  [spoke with client about client needs]  General Interventions   General Interventions Discussed/Reviewed General Interventions Discussed, Community Resources  Education Interventions   Education Provided Provided Education  Provided Verbal Education On [employer]  Mental Health Interventions   Mental Health Discussed/Reviewed Coping Strategies  Nutrition Interventions   Nutrition Discussed/Reviewed Nutrition Discussed  Pharmacy Interventions   Pharmacy Dicussed/Reviewed Pharmacy Topics Discussed  Safety Interventions   Safety Discussed/Reviewed Fall Risk        Follow up plan: LCSW has provided client with LCSW name and phone number. LCSW has encouraged Rue to call LCSW as needed for SW support for client at [phone]   Encounter Outcome:  Patient Visit Completed {THN Tip this will not be part of the note when signed-REQUIRED REPORT FIELD DO NOT DELETE (Optional):[id number]   Lorna Few  MSW, LCSW [place] Licensed Clinical Social Worker Direct Dial:  [phone] Fax:  [phone] Website:  [email or web address]

## 2024-02-07 NOTE — Patient Instructions (Signed)
 Visit Information  Thank you for taking time to visit with me today. Please don't hesitate to contact me if I can be of assistance to you.   Following are the goals we discussed today:   Goals Addressed             This Visit's Progress    Patient wants to live independently and receive support if needed       Interventions:  Spoke with  client, via phone today, about client needs Spoke with client about family support. She has support from her granddaughter, Emily Bentley. She has support of her daughter. She has support from her friends Discussed transport needs. She drives herself to appointment and to do errands as needed Discussed walking of client. Client gets dizzy occasionally. She does use a cane to help her walk Client said she does get fatigued and short of breath occasionally. Client has to take periodic rest breaks as needed. She said she may get short of breath when cleaning her home Dicussed sleeping issues of client. Discussed pain issues of client  Discussed support of PCP, Dr. Hillard Danker.  Discussed meal provision for client. She said she may go out to eat or have meal delivered to her home. She said she does not cook very often Discussed mood of client. She said she gets anxious occasionally. She said she is taking Lexapro as prescribed Discussed vision needs. She said she uses reading glasses to help her read [name] for phone call today with LCSW Encouraged client or Emily Bentley to call LCSW as needed for SW support  for client at [phone]         LCSW has provided client with LCSW name and phone number. LCSW has encouraged Emily Bentley to call LCSW as needed for SW support for client at [phone]  Please call the care guide team at [phone] if you need to cancel or reschedule your appointment.   If you are experiencing a Mental Health or Behavioral Health Crisis or need someone to talk to, please go to [facility] [address] [place] [phone])   The patient verbalized understanding of instructions, educational materials, and care plan provided today and DECLINED offer to receive copy of patient instructions, educational materials, and care plan.   The patient has been provided with contact information for the care management team and has been advised to call with any health related questions or concerns.    Lorna Few  MSW, LCSW [place] Licensed Clinical Social Worker Direct Dial:  [phone] Fax:  [phone] Website:  [email or web address]

## 2024-03-10 ENCOUNTER — Encounter: Payer: Self-pay | Admitting: Internal Medicine

## 2024-03-10 ENCOUNTER — Ambulatory Visit (INDEPENDENT_AMBULATORY_CARE_PROVIDER_SITE_OTHER): Admitting: Internal Medicine

## 2024-03-10 VITALS — BP 112/68 | Temp 98.6°F | Ht 62.0 in | Wt 168.0 lb

## 2024-03-10 DIAGNOSIS — R030 Elevated blood-pressure reading, without diagnosis of hypertension: Secondary | ICD-10-CM

## 2024-03-10 DIAGNOSIS — Z13220 Encounter for screening for lipoid disorders: Secondary | ICD-10-CM

## 2024-03-10 DIAGNOSIS — R413 Other amnesia: Secondary | ICD-10-CM | POA: Insufficient documentation

## 2024-03-10 DIAGNOSIS — R739 Hyperglycemia, unspecified: Secondary | ICD-10-CM | POA: Diagnosis not present

## 2024-03-10 DIAGNOSIS — R5383 Other fatigue: Secondary | ICD-10-CM | POA: Diagnosis not present

## 2024-03-10 DIAGNOSIS — E559 Vitamin D deficiency, unspecified: Secondary | ICD-10-CM

## 2024-03-10 DIAGNOSIS — F419 Anxiety disorder, unspecified: Secondary | ICD-10-CM

## 2024-03-10 LAB — TSH: TSH: 2.38 u[IU]/mL (ref 0.35–5.50)

## 2024-03-10 LAB — COMPREHENSIVE METABOLIC PANEL WITH GFR
ALT: 11 U/L (ref 0–35)
AST: 23 U/L (ref 0–37)
Albumin: 4 g/dL (ref 3.5–5.2)
Alkaline Phosphatase: 67 U/L (ref 39–117)
BUN: 22 mg/dL (ref 6–23)
CO2: 27 meq/L (ref 19–32)
Calcium: 9.3 mg/dL (ref 8.4–10.5)
Chloride: 107 meq/L (ref 96–112)
Creatinine, Ser: 1.26 mg/dL — ABNORMAL HIGH (ref 0.40–1.20)
GFR: 38.69 mL/min — ABNORMAL LOW (ref >60.00)
Glucose, Bld: 92 mg/dL (ref 70–99)
Potassium: 4.6 meq/L (ref 3.5–5.1)
Sodium: 140 meq/L (ref 135–145)
Total Bilirubin: 1 mg/dL (ref 0.2–1.2)
Total Protein: 7.3 g/dL (ref 6.0–8.3)

## 2024-03-10 LAB — LIPID PANEL
Cholesterol: 201 mg/dL — ABNORMAL HIGH (ref 0–200)
HDL: 51.8 mg/dL (ref >39.00)
LDL Cholesterol: 134 mg/dL — ABNORMAL HIGH (ref 0–99)
NonHDL: 149.58
Total CHOL/HDL Ratio: 4
Triglycerides: 80 mg/dL (ref 0.0–149.0)
VLDL: 16 mg/dL (ref 0.0–40.0)

## 2024-03-10 LAB — VITAMIN D 25 HYDROXY (VIT D DEFICIENCY, FRACTURES): VITD: 35.31 ng/mL (ref 30.00–100.00)

## 2024-03-10 LAB — HEMOGLOBIN A1C: Hgb A1c MFr Bld: 5.6 % (ref 4.6–6.5)

## 2024-03-10 LAB — VITAMIN B12: Vitamin B-12: 380 pg/mL (ref 211–911)

## 2024-03-10 NOTE — Assessment & Plan Note (Signed)
 Checking TSH and CBC and CMP and vitamin D  and B12.

## 2024-03-10 NOTE — Assessment & Plan Note (Signed)
 Having low BP at home and rx for midodrine  done for her. She did pass out due to this and fell once. She has not fallen recently. We discussed eating more consistently may help.

## 2024-03-10 NOTE — Progress Notes (Signed)
   Subjective:   Patient ID: Emily Bentley, female    DOB: 1935-04-26, 89 y.o.   MRN: [id number]  HPI The patient is an 89 YO female coming in for several concerns. See a/p for details including memory and low BP.   Review of Systems  Constitutional: Negative.   HENT: Negative.    Eyes: Negative.   Respiratory:  Negative for cough, chest tightness and shortness of breath.   Cardiovascular:  Negative for chest pain, palpitations and leg swelling.  Gastrointestinal:  Negative for abdominal distention, abdominal pain, constipation, diarrhea, nausea and vomiting.  Musculoskeletal: Negative.   Skin: Negative.   Neurological:  Positive for dizziness.  Psychiatric/Behavioral:  The patient is nervous/anxious.     Objective:  Physical Exam Constitutional:      Appearance: She is well-developed.  HENT:     Head: Normocephalic and atraumatic.  Cardiovascular:     Rate and Rhythm: Normal rate and regular rhythm.  Pulmonary:     Effort: Pulmonary effort is normal. No respiratory distress.     Breath sounds: Normal breath sounds. No wheezing or rales.  Abdominal:     General: Bowel sounds are normal. There is no distension.     Palpations: Abdomen is soft.     Tenderness: There is no abdominal tenderness. There is no rebound.  Musculoskeletal:     Cervical back: Normal range of motion.  Skin:    General: Skin is warm and dry.  Neurological:     Mental Status: She is alert and oriented to person, place, and time.     Coordination: Coordination normal.     Vitals:   03/10/24 1136 03/10/24 1144  BP:  112/68  Temp: 98.6 F (37 C)   TempSrc: Oral   SpO2: 99%   Weight: 168 lb (76.2 kg)   Height: 5\' 2"  (1.575 m)     Assessment & Plan:

## 2024-03-10 NOTE — Assessment & Plan Note (Signed)
 She feels that the change to lexapro  10 mg daily has helped. She rarely takes the hydroxyzine . She is having anxiety in the morning and shaking. She does not eat breakfast until 10-11am. I have asked her to try to drink protein shake immediately after waking. BP issues may be causing shaking. She does not want change to medication at this time. She has not pursued counseling and I asked her to do this.

## 2024-03-10 NOTE — Assessment & Plan Note (Signed)
 I suspect this is more related to anxiety. She is having some trouble remembering thoughts or words in the middle of a conversation but it always comes back within minutes. No repeating or asking family members same things.

## 2024-03-10 NOTE — Patient Instructions (Addendum)
 We will check the labs today and get you in with the neurologist for the memory check.  We are checking the labs today.

## 2024-03-11 LAB — CBC
HCT: 41.3 % (ref 36.0–46.0)
Hemoglobin: 13.5 g/dL (ref 12.0–15.0)
MCHC: 32.6 g/dL (ref 30.0–36.0)
MCV: 83.4 fl (ref 78.0–100.0)
Platelets: 212 10*3/uL (ref 150.0–400.0)
RBC: 4.96 Mil/uL (ref 3.87–5.11)
RDW: 14.4 % (ref 11.5–15.5)
WBC: 8 10*3/uL (ref 4.0–10.5)

## 2024-03-13 ENCOUNTER — Encounter: Payer: Self-pay | Admitting: Internal Medicine

## 2024-03-14 ENCOUNTER — Encounter: Payer: Self-pay | Admitting: Physician Assistant

## 2024-04-06 DIAGNOSIS — H26491 Other secondary cataract, right eye: Secondary | ICD-10-CM | POA: Diagnosis not present

## 2024-04-06 DIAGNOSIS — H532 Diplopia: Secondary | ICD-10-CM | POA: Diagnosis not present

## 2024-04-06 DIAGNOSIS — H353131 Nonexudative age-related macular degeneration, bilateral, early dry stage: Secondary | ICD-10-CM | POA: Diagnosis not present

## 2024-04-22 ENCOUNTER — Emergency Department (HOSPITAL_COMMUNITY)

## 2024-04-22 ENCOUNTER — Emergency Department (HOSPITAL_COMMUNITY)
Admission: EM | Admit: 2024-04-22 | Discharge: 2024-04-22 | Disposition: A | Discharge status: Home / Self Care | Attending: Emergency Medicine | Admitting: Emergency Medicine

## 2024-04-22 ENCOUNTER — Other Ambulatory Visit: Payer: Self-pay

## 2024-04-22 ENCOUNTER — Encounter (HOSPITAL_COMMUNITY): Payer: Self-pay | Admitting: *Deleted

## 2024-04-22 DIAGNOSIS — R0602 Shortness of breath: Secondary | ICD-10-CM | POA: Insufficient documentation

## 2024-04-22 DIAGNOSIS — R918 Other nonspecific abnormal finding of lung field: Secondary | ICD-10-CM | POA: Diagnosis not present

## 2024-04-22 DIAGNOSIS — D71 Functional disorders of polymorphonuclear neutrophils: Secondary | ICD-10-CM | POA: Diagnosis not present

## 2024-04-22 DIAGNOSIS — R0600 Dyspnea, unspecified: Secondary | ICD-10-CM

## 2024-04-22 DIAGNOSIS — I2510 Atherosclerotic heart disease of native coronary artery without angina pectoris: Secondary | ICD-10-CM | POA: Diagnosis not present

## 2024-04-22 DIAGNOSIS — R935 Abnormal findings on diagnostic imaging of other abdominal regions, including retroperitoneum: Secondary | ICD-10-CM | POA: Diagnosis not present

## 2024-04-22 LAB — CBC
HCT: 40.8 % (ref 36.0–46.0)
Hemoglobin: 13.2 g/dL (ref 12.0–15.0)
MCH: 25.9 pg — ABNORMAL LOW (ref 26.0–34.0)
MCHC: 32.4 g/dL (ref 30.0–36.0)
MCV: 80.2 fL (ref 80.0–100.0)
Platelets: 190 10*3/uL (ref 150–400)
RBC: 5.09 MIL/uL (ref 3.87–5.11)
RDW: 14.2 % (ref 11.5–15.5)
WBC: 6.9 10*3/uL (ref 4.0–10.5)
nRBC: 0 % (ref 0.0–0.2)

## 2024-04-22 LAB — BASIC METABOLIC PANEL WITH GFR
Anion gap: 10 (ref 5–15)
BUN: 19 mg/dL (ref 8–23)
CO2: 21 mmol/L — ABNORMAL LOW (ref 22–32)
Calcium: 9.3 mg/dL (ref 8.9–10.3)
Chloride: 109 mmol/L (ref 98–111)
Creatinine, Ser: 1.21 mg/dL — ABNORMAL HIGH (ref 0.44–1.00)
GFR, Estimated: 44 mL/min — ABNORMAL LOW (ref >60)
Glucose, Bld: 95 mg/dL (ref 70–99)
Potassium: 3.8 mmol/L (ref 3.5–5.1)
Sodium: 140 mmol/L (ref 135–145)

## 2024-04-22 LAB — TROPONIN I (HIGH SENSITIVITY)
Troponin I (High Sensitivity): 4 ng/L (ref <18)
Troponin I (High Sensitivity): 5 ng/L (ref <18)

## 2024-04-22 MED ORDER — IOHEXOL 350 MG/ML SOLN
75.0000 mL | Freq: Once | INTRAVENOUS | Status: AC | PRN
  Administered 2024-04-22: 75 mL via INTRAVENOUS

## 2024-04-22 NOTE — Discharge Instructions (Signed)
 Return for any problem.  ?

## 2024-04-22 NOTE — ED Provider Notes (Signed)
 [place] AT [facility] Provider Note   [name]: [id number] Arrival date & time: 04/22/24  0944     Patient presents with: Shortness of Breath   Emily Bentley is a 89 y.o. female.   89 year old female with prior medical history as detailed below presents for evaluation.  Patient complains of persistent dyspnea and shortness of breath.  Patient feels that her symptoms may be related to anxiety.  She reports that her symptoms have been ongoing for quite some time.  She reports they have been worse over the last week.  She denies chest pain.  She denies nausea or vomiting.  She denies fever.  The history is provided by the patient.       Prior to Admission medications   Medication Sig Start Date End Date Taking? Authorizing Provider  acetaminophen  (TYLENOL ) 650 MG CR tablet Take 650 mg by mouth every 8 (eight) hours as needed for pain.    [provider]  azelastine  (ASTELIN ) 0.1 % nasal spray Place 2 sprays into both nostrils 2 (two) times daily. 03/31/23   Adelia Homestead, MD  docusate sodium (COLACE) 100 MG capsule Take 100 mg by mouth daily as needed for mild constipation.    [provider]  escitalopram  (LEXAPRO ) 10 MG tablet Take 1 tablet (10 mg total) by mouth daily. 11/08/23   Adelia Homestead, MD  fluticasone  (FLONASE ) 50 MCG/ACT nasal spray Place 2 sprays into both nostrils daily. 03/31/23   Adelia Homestead, MD  hydrOXYzine  (ATARAX ) 10 MG tablet Take 0.5-1 tablets (5-10 mg total) by mouth 2 (two) times daily as needed for anxiety. 03/31/23   Adelia Homestead, MD  magnesium gluconate (MAGONATE) 500 MG tablet Take 500 mg by mouth as needed (constipation).    [provider]  magnesium hydroxide (MILK OF MAGNESIA) 400 MG/5ML suspension Take 30 mLs by mouth daily as needed for mild constipation.    [provider]  midodrine  (PROAMATINE ) 2.5 MG tablet Take 1 tablet (2.5 mg total) by mouth 3  (three) times daily with meals. 08/13/23   Swinyer, Leilani Punter, NP  multivitamin-iron-minerals-folic acid  (CENTRUM) chewable tablet Chew 1 tablet by mouth daily.    [provider]  rosuvastatin  (CRESTOR ) 10 MG tablet Take 1 tablet (10 mg total) by mouth daily. 09/15/22   Lucendia Rusk, MD  traMADol  (ULTRAM ) 50 MG tablet Take 0.5-1 tablets (25-50 mg total) by mouth every 12 (twelve) hours as needed. 05/03/23   Adelia Homestead, MD    Allergies: Sulfamethoxazole-trimethoprim    Review of Systems  All other systems reviewed and are negative.   Updated Vital Signs BP (!) 209/86   Pulse (!) 59   Temp (!) 97.3 F (36.3 C) (Temporal)   Resp 20   Ht 5' 3 (1.6 m)   Wt 76.2 kg   SpO2 100%   BMI 29.76 kg/m   Physical Exam Vitals and nursing note reviewed.  Constitutional:      General: She is not in acute distress.    Appearance: Normal appearance. She is well-developed.  HENT:     Head: Normocephalic and atraumatic.   Eyes:     Conjunctiva/sclera: Conjunctivae normal.     Pupils: Pupils are equal, round, and reactive to light.    Cardiovascular:     Rate and Rhythm: Normal rate and regular rhythm.     Heart sounds: Normal heart sounds.  Pulmonary:     Effort: Pulmonary effort is normal. No  respiratory distress.     Breath sounds: Normal breath sounds.  Abdominal:     General: There is no distension.     Palpations: Abdomen is soft.     Tenderness: There is no abdominal tenderness.   Musculoskeletal:        General: No deformity. Normal range of motion.     Cervical back: Normal range of motion and neck supple.   Skin:    General: Skin is warm and dry.   Neurological:     General: No focal deficit present.     Mental Status: She is alert and oriented to person, place, and time.     (all labs ordered are listed, but only abnormal results are displayed) Labs Reviewed  BASIC METABOLIC PANEL WITH GFR - Abnormal; Notable for the following components:       Result Value   CO2 21 (*)    Creatinine, Ser 1.21 (*)    GFR, Estimated 44 (*)    All other components within normal limits  CBC - Abnormal; Notable for the following components:   MCH 25.9 (*)    All other components within normal limits  TROPONIN I (HIGH SENSITIVITY)  TROPONIN I (HIGH SENSITIVITY)    EKG: EKG Interpretation Date/Time:  Saturday April 22 2024 09:57:12 EDT Ventricular Rate:  63 PR Interval:    QRS Duration:  76 QT Interval:  400 QTC Calculation: 409 R Axis:   69  Text Interpretation: Atrial fibrillation Septal infarct , age undetermined Abnormal ECG When compared with ECG of 11-Jun-2023 09:45, PREVIOUS ECG IS PRESENT Confirmed by Angela Kell [phone]) on 04/22/2024 10:17:35 AM  Radiology: Lenell Query Chest 2 View Result Date: 04/22/2024 EXAM: 2 VIEW(S) XRAY OF THE CHEST 04/22/2024 10:20:00 AM COMPARISON: 03/11/2015 CLINICAL HISTORY: Patient presents to ED c/o SOB onset this AM, states she has been having it off and on however it's getting worse. Denies chest pain or ext edema. FINDINGS: LUNGS AND PLEURA: No focal pulmonary opacity. No pulmonary edema. No pleural effusion. No pneumothorax. HEART AND MEDIASTINUM: No acute abnormality of the cardiac and mediastinal silhouettes. BONES AND SOFT TISSUES: No acute osseous abnormality. IMPRESSION: 1. No acute process. Electronically signed by: Audree Leas MD 04/22/2024 10:48 AM EDT RP Workstation: [id number]     Procedures   Medications Ordered in the ED  iohexol  (OMNIPAQUE ) 350 MG/ML injection 75 mL (75 mLs Intravenous Contrast Given 04/22/24 1230)                                    Medical Decision Making Amount and/or Complexity of Data Reviewed Labs: ordered. Radiology: ordered.  Risk Prescription drug management.    Medical Screen Complete  This patient presented to the ED with complaint of dyspnea.  This complaint involves an extensive number of treatment options. The initial differential  diagnosis includes, but is not limited to, PE, pneumonia, pneumothorax, metabolic abnormality, etc.  This presentation is: Acute, Chronic, Self-Limited, Previously Undiagnosed, Uncertain Prognosis, Complicated, Systemic Symptoms, and Threat to Life/Bodily Function  Patient with self-reported longstanding issues with dyspnea.  Patient without clearly abnormal vitals other than mild hypertension.  Patient with known history of hypertension.  Screening labs and imaging obtained are without significant acute abnormality.  All results discussed extensively with the patient and patient's family.  Patient desires discharge home after workup.  Importance of close follow-up was stressed.  Strict return precautions given and understood.    Additional  history obtained: External records from outside sources obtained and reviewed including prior ED visits and prior Inpatient records.   Problem List / ED Course:  Dyspnea   Reevaluation:  After the interventions noted above, I reevaluated the patient and found that they have: improved   Disposition:  After consideration of the diagnostic results and the patients response to treatment, I feel that the patent would benefit from close outpatient follow-up.       Final diagnoses:  Dyspnea, unspecified type    ED Discharge Orders     None          Burnette Carte, MD 04/22/24 [phone]

## 2024-04-22 NOTE — ED Triage Notes (Signed)
 Patient presents to ed c/o sob onset this am, states she has been having it off and on  however its getting worse. Denies chest pain or ext edema

## 2024-05-02 DIAGNOSIS — H26493 Other secondary cataract, bilateral: Secondary | ICD-10-CM | POA: Diagnosis not present

## 2024-05-02 DIAGNOSIS — H26491 Other secondary cataract, right eye: Secondary | ICD-10-CM | POA: Diagnosis not present

## 2024-05-02 DIAGNOSIS — H02834 Dermatochalasis of left upper eyelid: Secondary | ICD-10-CM | POA: Diagnosis not present

## 2024-05-02 DIAGNOSIS — H18413 Arcus senilis, bilateral: Secondary | ICD-10-CM | POA: Diagnosis not present

## 2024-05-02 DIAGNOSIS — Z961 Presence of intraocular lens: Secondary | ICD-10-CM | POA: Diagnosis not present

## 2024-05-09 ENCOUNTER — Ambulatory Visit (INDEPENDENT_AMBULATORY_CARE_PROVIDER_SITE_OTHER): Payer: Medicare Other

## 2024-05-09 VITALS — BP 108/62 | HR 67 | Ht 64.0 in | Wt 168.2 lb

## 2024-05-09 DIAGNOSIS — Z5982 Transportation insecurity: Secondary | ICD-10-CM | POA: Diagnosis not present

## 2024-05-09 DIAGNOSIS — Z0000 Encounter for general adult medical examination without abnormal findings: Secondary | ICD-10-CM

## 2024-05-09 DIAGNOSIS — Z7189 Other specified counseling: Secondary | ICD-10-CM

## 2024-05-09 NOTE — Patient Instructions (Addendum)
 Ms. Mclaurin , Thank you for taking time out of your busy schedule to complete your Annual Wellness Visit with me. I enjoyed our conversation and look forward to speaking with you again next year. I, as well as your care team,  appreciate your ongoing commitment to your health goals. Please review the following plan we discussed and let me know if I can assist you in the future. Your Game plan/ To Do List    Referrals: If you haven't heard from the office you've been referred to, please reach out to them at the phone provided.  Referral to [facility] for assistance for Grief Counseling, food and transportation, and safety housing devices. Follow up Visits: Next Medicare AWV with our clinical staff: 05/18/2025   Have you seen your provider in the last 6 months (3 months if uncontrolled diabetes)? Yes Next Office Visit with your provider: 09/11/2024 at 10:30am  Clinician Recommendations:  Aim for 30 minutes of exercise or brisk walking, 6-8 glasses of water, and 5 servings of fruits and vegetables each day.       This is a list of the screening recommended for you and due dates:  Health Maintenance  Topic Date Due   COVID-19 Vaccine (6 - 2024-25 season) 07/11/2023   Flu Shot  06/09/2024   Mammogram  09/05/2024   Medicare Annual Wellness Visit  05/09/2025   DTaP/Tdap/Td vaccine (3 - Td or Tdap) 02/04/2028   Pneumococcal Vaccine for age over 44  Completed   DEXA scan (bone density measurement)  Completed   Zoster (Shingles) Vaccine  Completed   Hepatitis B Vaccine  Aged Out   HPV Vaccine  Aged Out   Meningitis B Vaccine  Aged Out    Advanced directives: (Provided) Advance directive discussed with you today. I have provided a copy for you to complete at home and have notarized. Once this is complete, please bring a copy in to our office so we can scan it into your chart.  Advance Care Planning is important because it:  [x]  Makes sure you receive the medical care that is consistent with your values,  goals, and preferences  [x]  It provides guidance to your family and loved ones and reduces their decisional burden about whether or not they are making the right decisions based on your wishes.  Follow the link provided in your after visit summary or read over the paperwork we have mailed to you to help you started getting your Advance Directives in place. If you need assistance in completing these, please reach out to us  so that we can help you!

## 2024-05-09 NOTE — Addendum Note (Signed)
 Addended by: Dierre Crevier M on: 05/09/2024 12:14 PM   Modules accepted: Orders

## 2024-05-09 NOTE — Progress Notes (Signed)
 Subjective:   Emily Bentley is a 89 y.o. who presents for a Medicare Wellness preventive visit.  As a reminder, Annual Wellness Visits don't include a physical exam, and some assessments may be limited, especially if this visit is performed virtually. We may recommend an in-person follow-up visit with your provider if needed.  Visit Complete: In person  Persons Participating in Visit: Patient.  AWV Questionnaire: No: Patient Medicare AWV questionnaire was not completed prior to this visit.  Cardiac Risk Factors include: advanced age (>24men, >101 women)     Objective:    Today's Vitals   05/09/24 1131  BP: 108/62  Pulse: 67  SpO2: 98%  Weight: 168 lb 3.2 oz (76.3 kg)  Height: 5' 4 (1.626 m)   Body mass index is 28.87 kg/m.     05/09/2024   11:31 AM 04/22/2024   10:01 AM 07/16/2023   12:52 PM 05/03/2023   11:26 AM 05/01/2022    1:31 PM 08/07/2021    6:41 PM 05/07/2020    7:53 PM  Advanced Directives  Does Patient Have a Medical Advance Directive? No No Yes Yes No No No  Type of Chief of Staff of [place];Living will     Does patient want to make changes to medical advance directive?   No - Patient declined      Copy of Healthcare Power of Attorney in Chart?   No - copy requested No - copy requested     Would patient like information on creating a medical advance directive? No - Patient declined  No - Patient declined No - Patient declined No - Patient declined No - Patient declined No - Patient declined    Current Medications (verified) Outpatient Encounter Medications as of 05/09/2024  Medication Sig   acetaminophen  (TYLENOL ) 650 MG CR tablet Take 650 mg by mouth every 8 (eight) hours as needed for pain.   azelastine  (ASTELIN ) 0.1 % nasal spray Place 2 sprays into both nostrils 2 (two) times daily.   docusate sodium (COLACE) 100 MG capsule Take 100 mg by mouth daily as needed for mild constipation.   escitalopram   (LEXAPRO ) 10 MG tablet Take 1 tablet (10 mg total) by mouth daily.   fluticasone  (FLONASE ) 50 MCG/ACT nasal spray Place 2 sprays into both nostrils daily.   hydrOXYzine  (ATARAX ) 10 MG tablet Take 0.5-1 tablets (5-10 mg total) by mouth 2 (two) times daily as needed for anxiety.   magnesium gluconate (MAGONATE) 500 MG tablet Take 500 mg by mouth as needed (constipation).   magnesium hydroxide (MILK OF MAGNESIA) 400 MG/5ML suspension Take 30 mLs by mouth daily as needed for mild constipation.   midodrine  (PROAMATINE ) 2.5 MG tablet Take 1 tablet (2.5 mg total) by mouth 3 (three) times daily with meals.   multivitamin-iron-minerals-folic acid  (CENTRUM) chewable tablet Chew 1 tablet by mouth daily.   rosuvastatin  (CRESTOR ) 10 MG tablet Take 1 tablet (10 mg total) by mouth daily.   traMADol  (ULTRAM ) 50 MG tablet Take 0.5-1 tablets (25-50 mg total) by mouth every 12 (twelve) hours as needed.   No facility-administered encounter medications on file as of 05/09/2024.    Allergies (verified) Sulfamethoxazole-trimethoprim   History: Past Medical History:  Diagnosis Date   Allergy    Arthritis    Asthma    Cataract    Chronic allergic rhinitis    with asthmatic component   Diverticulosis of colon (without mention of hemorrhage)    GERD (gastroesophageal reflux disease)  Hearing impairment    Hiatal hernia    History of bursitis    right shoulder   History of cochlear implant    Cant have an MRI   History of colon polyps    History of pneumonia    History of PSVT (paroxysmal supraventricular tachycardia)    Hormone replacement therapy (postmenopausal)    IBS (irritable bowel syndrome)    Peptic stricture of esophagus    Watermelon stomach    Past Surgical History:  Procedure Laterality Date   ABDOMINAL HYSTERECTOMY     APPENDECTOMY     CHOLECYSTECTOMY     COCHLEAR IMPLANT Left Oct '14   Done at [facility] - great results / Serial No: [id number] / Model: [id number]    ESOPHAGOGASTRODUODENOSCOPY     EYE SURGERY     POLYPECTOMY     TUBAL LIGATION     urethral dilatations     VENTRICULAR ABLATION SURGERY     Family History  Problem Relation Age of Onset   Diabetes Mother    Uterine cancer Mother    Coronary artery disease Father        CABG x 5 in 1985   Hypertension Father    Cancer Sister    Cancer Brother    Cancer Maternal Aunt    Colon cancer Neg Hx    Social History   Socioeconomic History   Marital status: Widowed    Spouse name: Not on file   Number of children: 4   Years of education: Not on file   Highest education level: 12th grade  Occupational History   Occupation: retired    Associate Professor: [employer]  Tobacco Use   Smoking status: Never   Smokeless tobacco: Never  Vaping Use   Vaping status: Never Used  Substance and Sexual Activity   Alcohol use: No   Drug use: No   Sexual activity: Not Currently  Other Topics Concern   Not on file  Social History Narrative   Married '58- 9 years, divorced; married '68- widowed '721 son '66, 3 daughters '59, '60, '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 Retired '76Live- aloneOcc caffeine       07/2021 Hard of hearing - using new closed caption device    Social Drivers of Health   Financial Resource Strain: Low Risk  (05/09/2024)   Overall Financial Resource Strain (CARDIA)    Difficulty of Paying Living Expenses: Not hard at all  Food Insecurity: Food Insecurity Present (05/09/2024)   Hunger Vital Sign    Worried About Running Out of Food in the Last Year: Sometimes true    Ran Out of Food in the Last Year: Sometimes true  Transportation Needs: Unmet Transportation Needs (05/09/2024)   PRAPARE - Administrator, Civil Service (Medical): Yes    Lack of Transportation (Non-Medical): Yes  Physical Activity: Insufficiently Active (05/09/2024)   Exercise Vital Sign    Days of Exercise per Week: 2 days    Minutes of Exercise per Session: 10 min  Stress: Stress  Concern Present (05/09/2024)   [employer] of Occupational Health - Occupational Stress Questionnaire    Feeling of Stress: Very much  Social Connections: Socially Isolated (05/09/2024)   Social Connection and Isolation Panel    Frequency of Communication with Friends and Family: More than three times a week    Frequency of Social Gatherings with Friends and Family: More than three times a week    Attends Religious Services: Never  Active Member of Clubs or Organizations: No    Attends Banker Meetings: Never    Marital Status: Widowed    Tobacco Counseling Counseling given: No    Clinical Intake:  Pre-visit preparation completed: Yes  Pain : No/denies pain     BMI - recorded: 28.87 Nutritional Status: BMI 25 -29 Overweight Nutritional Risks: None Diabetes: No  Lab Results  Component Value Date   HGBA1C 5.6 03/10/2024   HGBA1C 5.5 12/15/2016   HGBA1C 5.5 03/25/2015     How often do you need to have someone help you when you read instructions, pamphlets, or other written materials from your doctor or pharmacy?: 1 - Never  Interpreter Needed?: No  Information entered by :: Verdie Saba, CMA   Activities of Daily Living     05/09/2024   11:37 AM  In your present state of health, do you have any difficulty performing the following activities:  Hearing? 0  Vision? 0  Difficulty concentrating or making decisions? 0  Walking or climbing stairs? 0  Dressing or bathing? 0  Doing errands, shopping? 0  Preparing Food and eating ? N  Using the Toilet? N  In the past six months, have you accidently leaked urine? Y  Comment wears a pad  Do you have problems with loss of bowel control? N  Managing your Medications? N  Managing your Finances? N  Housekeeping or managing your Housekeeping? N    Patient Care Team: Rollene Almarie LABOR, MD as PCP - General (Internal Medicine) Dann Candyce RAMAN, MD as PCP - Cardiology (Cardiology) Abran Norleen SAILOR, MD  as Consulting Physician (Gastroenterology) Frances Ozell RAMAN, LCSW as [facility] Care Management (Licensed Clinical Social Worker) Debarah Lorrene DEL., MD (Ophthalmology)  I have updated your Care Teams any recent Medical Services you may have received from other providers in the past year.     Assessment:   This is a routine wellness examination for Rickiya.  Hearing/Vision screen Hearing Screening - Comments:: Denies hearing difficulties   Vision Screening - Comments:: Wears eyeglasses - up to date with routine eye exams with Dr Milissa   Goals Addressed               This Visit's Progress     Patient Stated (pt-stated)        Patient stated that she wants to manage her blood pressure and anxiety       Depression Screen     05/09/2024   12:10 PM 03/10/2024   11:48 AM 02/07/2024    3:01 PM 01/11/2024    1:42 PM 11/15/2023   11:47 AM 09/29/2023   10:08 AM 08/09/2023    2:31 PM  PHQ 2/9 Scores  PHQ - 2 Score 3 5 2 2 2 2 2   PHQ- 9 Score 6 8 7 7 7 7 7     Fall Risk     05/09/2024   11:37 AM 03/10/2024   11:48 AM 05/24/2023    9:36 AM 05/03/2023   11:29 AM 03/31/2023    9:03 AM  Fall Risk   Falls in the past year? 1 1 1 1  0  Comment Dec 2024      Number falls in past yr: 0 0 1 1 0  Injury with Fall? 0 0 0 0 0  Risk for fall due to : History of fall(s);Impaired balance/gait  History of fall(s)    Follow up Falls evaluation completed;Falls prevention discussed Falls evaluation completed Falls evaluation completed Falls  prevention discussed Falls evaluation completed    MEDICARE RISK AT HOME:  Medicare Risk at Home Any stairs in or around the home?: No If so, are there any without handrails?: No Home free of loose throw rugs in walkways, pet beds, electrical cords, etc?: Yes Adequate lighting in your home to reduce risk of falls?: Yes Life alert?: No Use of a cane, walker or w/c?: No Grab bars in the bathroom?: Yes Shower chair or bench in shower?: No Elevated  toilet seat or a handicapped toilet?: No  TIMED UP AND GO:  Was the test performed?  No  Cognitive Function: 6CIT completed        05/09/2024   11:54 AM 05/03/2023   11:53 AM 05/01/2022    1:35 PM  6CIT Screen  What Year? 0 points 0 points 0 points  What month? 0 points 0 points 0 points  What time? 0 points 0 points 0 points  Count back from 20 0 points 0 points 0 points  Months in reverse 0 points 0 points 0 points  Repeat phrase 0 points 0 points 0 points  Total Score 0 points 0 points 0 points    Immunizations Immunization History  Administered Date(s) Administered   Fluad Quad(high Dose 65+) 09/27/2019, 10/16/2020, 11/19/2022   Influenza Split 09/16/2012   Influenza, High Dose Seasonal PF 09/27/2018, 01/10/2021   Influenza,inj,Quad PF,6+ Mos 08/07/2014, 08/29/2015, 08/05/2016   Influenza-Unspecified 08/23/2017, 09/27/2018   PFIZER(Purple Top)SARS-COV-2 Vaccination 11/24/2019, 12/15/2019, 09/13/2020, 01/10/2021, 07/23/2021   Pneumococcal Conjugate-13 04/25/2015   Pneumococcal Polysaccharide-23 09/16/2012, 01/10/2021   Td 01/27/2007   Tdap 02/03/2018   Zoster Recombinant(Shingrix) 02/04/2018, 04/19/2018   Zoster, Live 06/29/2013    Screening Tests Health Maintenance  Topic Date Due   COVID-19 Vaccine (6 - 2024-25 season) 07/11/2023   INFLUENZA VACCINE  06/09/2024   MAMMOGRAM  09/05/2024   Medicare Annual Wellness (AWV)  05/09/2025   DTaP/Tdap/Td (3 - Td or Tdap) 02/04/2028   Pneumococcal Vaccine: 50+ Years  Completed   DEXA SCAN  Completed   Zoster Vaccines- Shingrix  Completed   Hepatitis B Vaccines  Aged Out   HPV VACCINES  Aged Out   Meningococcal B Vaccine  Aged Out    Health Maintenance  Health Maintenance Due  Topic Date Due   COVID-19 Vaccine (6 - 2024-25 season) 07/11/2023   Health Maintenance Items Addressed:  Referral to [facility] - assistance w/food, safety devices for home, Grief counseling, and transportation  Additional Screening:  Vision  Screening: Recommended annual ophthalmology exams for early detection of glaucoma and other disorders of the eye. Would you like a referral to an eye doctor? No    Dental Screening: Recommended annual dental exams for proper oral hygiene  Community Resource Referral / Chronic Care Management: CRR required this visit?  Yes - Referral to [facility] - assistance w/food, safety devices for home, Grief counseling, and transportation  CCM required this visit?  No   Plan:    I have personally reviewed and noted the following in the patient's chart:   Medical and social history Use of alcohol, tobacco or illicit drugs  Current medications and supplements including opioid prescriptions. Patient is currently taking opioid prescriptions. Information provided to patient regarding non-opioid alternatives. Patient advised to discuss non-opioid treatment plan with their provider. Functional ability and status Nutritional status Physical activity Advanced directives List of other physicians Hospitalizations, surgeries, and ER visits in previous 12 months Vitals Screenings to include cognitive, depression, and falls Referrals and appointments  In  addition, I have reviewed and discussed with patient certain preventive protocols, quality metrics, and best practice recommendations. A written personalized care plan for preventive services as well as general preventive health recommendations were provided to patient.   Verdie CHRISTELLA Saba, CMA   05/09/2024   After Visit Summary: (In Person-Printed) AVS printed and given to the patient  Notes: Nothing significant to report at this time.

## 2024-05-10 ENCOUNTER — Telehealth: Payer: Self-pay | Admitting: *Deleted

## 2024-05-10 NOTE — Progress Notes (Unsigned)
 Complex Care Management Note Care Guide Note  05/10/2024 Name: Emily Bentley MRN: [id number] DOB: Nov 13, 1934   Complex Care Management Outreach Attempts: An unsuccessful telephone outreach was attempted today to offer the patient information about available complex care management services.  Follow Up Plan:  Additional outreach attempts will be made to offer the patient complex care management information and services.   Encounter Outcome:  No Answer  Thedford Franks, CMA [place]  [facility], [facility] Guide Direct Dial: [phone]  Fax: [phone] Website: [email or web address]

## 2024-05-11 NOTE — Progress Notes (Signed)
 Complex Care Management Note  Care Guide Note 05/11/2024 Name: Emily Bentley MRN: [id number] DOB: 18-Aug-1935  Emily Bentley is a 89 y.o. year old female who sees Emily Almarie LABOR, MD for primary care. I reached out to Emily Bentley by phone today to offer complex care management services.  Emily Bentley was given information about Complex Care Management services today including:   The Complex Care Management services include support from the care team which includes your Nurse Care Manager, Clinical Social Worker, or Pharmacist.  The Complex Care Management team is here to help remove barriers to the health concerns and goals most important to you. Complex Care Management services are voluntary, and the patient may decline or stop services at any time by request to their care team member.   Complex Care Management Consent Status: Patient agreed to services and verbal consent obtained.   Follow up plan:  Telephone appointment with complex care management team member scheduled for:  05/17/2024  Encounter Outcome:  Patient Scheduled  Thedford Franks, CMA [place]  [facility], [facility] Guide Direct Dial: [phone]  Fax: [phone] Website: [email or web address]

## 2024-05-14 NOTE — Progress Notes (Addendum)
 Assessment/Plan:     Emily Bentley is a very pleasant 89 y.o. year old RH female with a history of hypertension, hyperlipidemia, anxiety, atrial fibrillation, history of left cochlear implant, laryngopharyngeal reflux (LPR), vitamin D  deficiency, seen today for evaluation of memory loss. MoCA today is 14/30.  Etiology is not clear, concern for multifactorial versus a neurodegenerative disease.  There could be a component no hearing loss which could affect comprehension.  Patient will be unable to have an MRI of the brain due to cochlear implants.   Memory Impairment of unclear etiology   CT head without contrast to assess for underlying structural abnormality and assess vascular load  Replenish B12 Recommend good control of cardiovascular risk factors. Pending on the results of the CT of the head will consider antidementia medication. Continue to control mood as per PCP Monitor driving [place] up in 3 to 4 months  Subjective:    The patient is accompanied by her daughter  who supplements  the history.    How long did patient have memory difficulties?  For about 1.5 years after the consecutive death of her 3 sisters.  Patient reports some difficulty remembering new information, recent conversations, names.  STM worse than LTM . Likes to read, bowling, likes crossword puzzles, crochet.  repeats oneself?  Endorsed, occasionally Disoriented when walking into a room? Denies.    Leaving objects in unusual places?  Denies.   Wandering behavior? Denies.   Any personality changes, or depression, anxiety? Denies Hallucinations or paranoia? Denies.   Seizures? Denies.    Any sleep changes?  Sleeps well ,frequent nightmares or dream reenactment, other REM behavior or sleepwalking   Sleep apnea? Denies.   Any hygiene concerns?  Denies.   Independent of bathing and dressing? Endorsed  Does the patient need help with medications? Patient is in charge   Who is in charge of the finances?  Patient is in charge     Any changes in appetite?   Denies.     Patient have trouble swallowing?  Denies,except in the morning (LPR) Does the patient cook? Yes,  denies forgetting common recipes or kitchen accidents   Any headaches?  Denies.   Chronic pain? Denies.   Ambulates with difficulty? Denies.  Needs a cane to ambulate for stability but does not like using it.  Pending on ht the weather she walks, trying to get into an exercise program. Recent falls or head injuries? Denies.     Vision changes?  Recently she had right cataract extraction, now she is to undergo left cataract extraction at the end of this week. Any strokelike symptoms? Denies.   Any tremors? Denies.  Any anosmia? Denies.   Any incontinence of urine?  She has OAB. She may need pull ups depends where I am going Any bowel dysfunction?Constipation Patient lives alone    History of heavy alcohol intake? Denies.   History of heavy tobacco use? Denies.   Family history of dementia?  Denies  Does patient drive?  yes, very short distances denies getting lost. Worked at [employer] 2020  Patient labs June 2025 unremarkable CBC, TSHS 2.38, Vit D 35, B12 380  Allergies  Allergen Reactions   Sulfamethoxazole-Trimethoprim     Other reaction(s): Unknown    Current Outpatient Medications  Medication Instructions   acetaminophen  (TYLENOL ) 650 mg, Every 8 hours PRN   azelastine  (ASTELIN ) 0.1 % nasal spray 2 sprays, Each Nare, 2 times daily   docusate sodium (COLACE) 100 mg, Daily  PRN   escitalopram  (LEXAPRO ) 10 mg, Oral, Daily   fluticasone  (FLONASE ) 50 MCG/ACT nasal spray 2 sprays, Each Nare, Daily   hydrOXYzine  (ATARAX ) 5-10 mg, Oral, 2 times daily PRN   magnesium gluconate (MAGONATE) 500 mg, As needed   magnesium hydroxide (MILK OF MAGNESIA) 400 MG/5ML suspension 30 mLs, Daily PRN   midodrine  (PROAMATINE ) 2.5 mg, Oral, 3 times daily with meals   multivitamin-iron-minerals-folic acid  (CENTRUM) chewable tablet 1  tablet, Daily   rosuvastatin  (CRESTOR ) 10 mg, Oral, Daily   traMADol  (ULTRAM ) 25-50 mg, Oral, Every 12 hours PRN     VITALS:   Vitals:   05/16/24 1327  BP: 139/81  Pulse: 68  Resp: 20  SpO2: 98%  Weight: 168 lb (76.2 kg)  Height: 5' 3 (1.6 m)     Physical Exam  :    05/16/2024    5:00 PM  Montreal Cognitive Assessment   Visuospatial/ Executive (0/5) 0  Naming (0/3) 1  Attention: Read list of digits (0/2) 1  Attention: Read list of letters (0/1) 0  Attention: Serial 7 subtraction starting at 100 (0/3) 1  Language: Repeat phrase (0/2) 0  Language : Fluency (0/1) 0  Abstraction (0/2) 0  Delayed Recall (0/5) 4  Orientation (0/6) 6  Total 13  Adjusted Score (based on education) 14        No data to display             HEENT:  Normocephalic, atraumatic.  The superficial temporal arteries are without ropiness or tenderness. Cardiovascular: Regular rate and rhythm. Lungs: Clear to auscultation bilaterally. Neck: There are no carotid bruits noted bilaterally. Orientation:  Alert and oriented to person, place and to time. No aphasia or dysarthria. Fund of knowledge is appropriate. Recent and remote memory impaired.  Attention and concentration are reduced.  Able to name objects and unable to repeat phrases.  Delayed recall 4/5 cranial nerves: There is good facial symmetry. Extraocular muscles are intact and visual fields are full to confrontational testing. Speech is fluent and clear. No tongue deviation. Hearing is fair to conversational tone with left cochlear implant  Tone: Tone is good throughout. Sensation: Sensation is intact to light touch.  Vibration is intact at the bilateral big toe.  Coordination: The patient has no difficulty with RAM's or FNF bilaterally. Normal finger to nose  Motor: Strength is 5/5 in the bilateral upper and lower extremities. There is no pronator drift. There are no fasciculations noted. DTR's: Deep tendon reflexes are 2/4  bilaterally. Gait and Station: The patient is able to ambulate without difficulty. Gait is cautious and narrow. Stride length is normal.     Thank you for allowing us  the opportunity to participate in the care of this nice patient. Please do not hesitate to contact us  for any questions or concerns.   Total time spent on today's visit was 45 minutes dedicated to this patient today, preparing to see patient, examining the patient, ordering tests and/or medications and counseling the patient, documenting clinical information in the EHR or other health record, independently interpreting results and communicating results to the patient/family, discussing treatment and goals, answering patient's questions and coordinating care.  Cc:  Rollene Almarie LABOR, MD  Camie Sevin 05/16/2024 6:01 PM

## 2024-05-16 ENCOUNTER — Encounter

## 2024-05-16 ENCOUNTER — Ambulatory Visit (INDEPENDENT_AMBULATORY_CARE_PROVIDER_SITE_OTHER): Payer: Self-pay | Admitting: Physician Assistant

## 2024-05-16 ENCOUNTER — Encounter: Payer: Self-pay | Admitting: Physician Assistant

## 2024-05-16 VITALS — BP 139/81 | HR 68 | Resp 20 | Ht 63.0 in | Wt 168.0 lb

## 2024-05-16 DIAGNOSIS — R413 Other amnesia: Secondary | ICD-10-CM

## 2024-05-16 NOTE — Patient Instructions (Addendum)
 It was a pleasure to see you today at our office.   Recommendations:   CT of the brain, the radiology office will call you to arrange you appointment  [phone] at [facility] Replenish B12 at 1000 mcg daily Follow up in 4 months   Will entertain memory pill    [email or web address]   RECOMMENDATIONS FOR ALL PATIENTS WITH MEMORY PROBLEMS: 1. Continue to exercise (Recommend 30 minutes of walking everyday, or 3 hours every week) 2. Increase social interactions - continue going to [place] and enjoy social gatherings with friends and family 3. Eat healthy, avoid fried foods and eat more fruits and vegetables 4. Maintain adequate blood pressure, blood sugar, and blood cholesterol level. Reducing the risk of stroke and cardiovascular disease also helps promoting better memory. 5. Avoid stressful situations. Live a simple life and avoid aggravations. Organize your time and prepare for the next day in anticipation. 6. Sleep well, avoid any interruptions of sleep and avoid any distractions in the bedroom that may interfere with adequate sleep quality 7. Avoid sugar, avoid sweets as there is a strong link between excessive sugar intake, diabetes, and cognitive impairment We discussed the Mediterranean diet, which has been shown to help patients reduce the risk of progressive memory disorders and reduces cardiovascular risk. This includes eating fish, eat fruits and green leafy vegetables, nuts like almonds and hazelnuts, walnuts, and also use olive oil. Avoid fast foods and fried foods as much as possible. Avoid sweets and sugar as sugar use has been linked to worsening of memory function.  There is always a concern of gradual progression of memory problems. If this is the case, then we may need to adjust level of care according to patient needs. Support, both to the patient and caregiver, should then be put into place.      You have  been referred for a neuropsychological evaluation (i.e., evaluation of memory and thinking abilities). Please bring someone with you to this appointment if possible, as it is helpful for the doctor to hear from both you and another adult who knows you well. Please bring eyeglasses and hearing aids if you wear them.    The evaluation will take approximately 3 hours and has two parts:   The first part is a clinical interview with the neuropsychologist (Dr. Richie or Dr. Gayland). During the interview, the neuropsychologist will speak with you and the individual you brought to the appointment.    The second part of the evaluation is testing with the doctor's technician Neal or Luke). During the testing, the technician will ask you to remember different types of material, solve problems, and answer some questionnaires. Your family member will not be present for this portion of the evaluation.   Please note: We must reserve several hours of the neuropsychologist's time and the psychometrician's time for your evaluation appointment. As such, there is a No-Show fee of $100. If you are unable to attend any of your appointments, please contact our office as soon as possible to reschedule.      DRIVING: Regarding driving, in patients with progressive memory problems, driving will be impaired. We advise to have someone else do the driving if trouble finding directions or if minor accidents are reported. Independent driving assessment is available to determine safety of driving.   If you are interested in the driving assessment, you can contact the following:  [facility] in [place] [phone]  [facility] [phone]  [facility] [phone]  [facility]  [phone] or [phone]   FALL PRECAUTIONS: Be cautious when walking. Scan the area for obstacles that may increase the risk of trips and falls. When getting up in the mornings, sit up at the  edge of the bed for a few minutes before getting out of bed. Consider elevating the bed at the head end to avoid drop of blood pressure when getting up. Walk always in a well-lit room (use night lights in the walls). Avoid area rugs or power cords from appliances in the middle of the walkways. Use a walker or a cane if necessary and consider physical therapy for balance exercise. Get your eyesight checked regularly.  FINANCIAL OVERSIGHT: Supervision, especially oversight when making financial decisions or transactions is also recommended.  HOME SAFETY: Consider the safety of the kitchen when operating appliances like stoves, microwave oven, and blender. Consider having supervision and share cooking responsibilities until no longer able to participate in those. Accidents with firearms and other hazards in the house should be identified and addressed as well.   ABILITY TO BE LEFT ALONE: If patient is unable to contact 911 operator, consider using LifeLine, or when the need is there, arrange for someone to stay with patients. Smoking is a fire hazard, consider supervision or cessation. Risk of wandering should be assessed by caregiver and if detected at any point, supervision and safe proof recommendations should be instituted.  MEDICATION SUPERVISION: Inability to self-administer medication needs to be constantly addressed. Implement a mechanism to ensure safe administration of the medications.      Mediterranean Diet A Mediterranean diet refers to food and lifestyle choices that are based on the traditions of countries located on the [employer]. This way of eating has been shown to help prevent certain conditions and improve outcomes for people who have chronic diseases, like kidney disease and heart disease. What are tips for following this plan? Lifestyle  Cook and eat meals together with your family, when possible. Drink enough fluid to keep your urine clear or pale yellow. Be  physically active every day. This includes: Aerobic exercise like running or swimming. Leisure activities like gardening, walking, or housework. Get 7-8 hours of sleep each night. If recommended by your health care provider, drink red wine in moderation. This means 1 glass a day for nonpregnant women and 2 glasses a day for men. A glass of wine equals 5 oz (150 mL). Reading food labels  Check the serving size of packaged foods. For foods such as rice and pasta, the serving size refers to the amount of cooked product, not dry. Check the total fat in packaged foods. Avoid foods that have saturated fat or trans fats. Check the ingredients list for added sugars, such as corn syrup. Shopping  At the grocery store, buy most of your food from the areas near the walls of the store. This includes: Fresh fruits and vegetables (produce). Grains, beans, nuts, and seeds. Some of these may be available in unpackaged forms or large amounts (in bulk). Fresh seafood. Poultry and eggs. Low-fat dairy products. Buy whole ingredients instead of prepackaged foods. Buy fresh fruits and vegetables in-season from local farmers markets. Buy frozen fruits and vegetables in resealable bags. If you do not have access to quality fresh seafood, buy precooked frozen shrimp or canned fish, such as tuna, salmon, or sardines. Buy small amounts of raw or cooked vegetables, salads, or olives from the deli or salad bar at your store. Stock your pantry so you always have certain foods on  hand, such as olive oil, canned tuna, canned tomatoes, rice, pasta, and beans. Cooking  Cook foods with extra-virgin olive oil instead of using butter or other vegetable oils. Have meat as a side dish, and have vegetables or grains as your main dish. This means having meat in small portions or adding small amounts of meat to foods like pasta or stew. Use beans or vegetables instead of meat in common dishes like chili or lasagna. Experiment with  different cooking methods. Try roasting or broiling vegetables instead of steaming or sauteing them. Add frozen vegetables to soups, stews, pasta, or rice. Add nuts or seeds for added healthy fat at each meal. You can add these to yogurt, salads, or vegetable dishes. Marinate fish or vegetables using olive oil, lemon juice, garlic, and fresh herbs. Meal planning  Plan to eat 1 vegetarian meal one day each week. Try to work up to 2 vegetarian meals, if possible. Eat seafood 2 or more times a week. Have healthy snacks readily available, such as: Vegetable sticks with hummus. Greek yogurt. Fruit and nut trail mix. Eat balanced meals throughout the week. This includes: Fruit: 2-3 servings a day Vegetables: 4-5 servings a day Low-fat dairy: 2 servings a day Fish, poultry, or lean meat: 1 serving a day Beans and legumes: 2 or more servings a week Nuts and seeds: 1-2 servings a day Whole grains: 6-8 servings a day Extra-virgin olive oil: 3-4 servings a day Limit red meat and sweets to only a few servings a month What are my food choices? Mediterranean diet Recommended Grains: Whole-grain pasta. Brown rice. Bulgar wheat. Polenta. Couscous. Whole-wheat bread. [place] Madeira. Vegetables: Artichokes. Beets. Broccoli. Cabbage. Carrots. Eggplant. Green beans. Chard. Kale. Spinach. Onions. Leeks. Peas. Squash. Tomatoes. Peppers. Radishes. Fruits: Apples. Apricots. Avocado. Berries. Bananas. Cherries. Dates. Figs. Grapes. Lemons. Melon. Oranges. Peaches. Plums. Pomegranate. Meats and other protein foods: Beans. Almonds. Sunflower seeds. Pine nuts. Peanuts. Cod. Salmon. Scallops. Shrimp. Tuna. Tilapia. Clams. Oysters. Eggs. Dairy: Low-fat milk. Cheese. Greek yogurt. Beverages: Water. Red wine. Herbal tea. Fats and oils: Extra virgin olive oil. Avocado oil. Grape seed oil. Sweets and desserts: [place] yogurt with honey. Baked apples. Poached pears. Trail mix. Seasoning and other foods: Basil.  Cilantro. Coriander. Cumin. Mint. Parsley. Sage. Rosemary. Tarragon. Garlic. Oregano. Thyme. Pepper. Balsalmic vinegar. Tahini. Hummus. Tomato sauce. Olives. Mushrooms. Limit these Grains: Prepackaged pasta or rice dishes. Prepackaged cereal with added sugar. Vegetables: Deep fried potatoes (french fries). Fruits: Fruit canned in syrup. Meats and other protein foods: Beef. Pork. Lamb. Poultry with skin. Hot dogs. [place]. Dairy: Ice cream. Sour cream. Whole milk. Beverages: Juice. Sugar-sweetened soft drinks. Beer. Liquor and spirits. Fats and oils: Butter. Canola oil. Vegetable oil. Beef fat (tallow). Lard. Sweets and desserts: Cookies. Cakes. Pies. Candy. Seasoning and other foods: Mayonnaise. Premade sauces and marinades. The items listed may not be a complete list. Talk with your dietitian about what dietary choices are right for you. Summary The Mediterranean diet includes both food and lifestyle choices. Eat a variety of fresh fruits and vegetables, beans, nuts, seeds, and whole grains. Limit the amount of red meat and sweets that you eat. Talk with your health care provider about whether it is safe for you to drink red wine in moderation. This means 1 glass a day for nonpregnant women and 2 glasses a day for men. A glass of wine equals 5 oz (150 mL). This information is not intended to replace advice given to you by your health care provider. Make sure  you discuss any questions you have with your health care provider. Document Released: 06/18/2016 Document Revised: 07/21/2016 Document Reviewed: 06/18/2016 Elsevier Interactive Patient Education  2017 [employer].

## 2024-05-17 ENCOUNTER — Other Ambulatory Visit: Payer: Self-pay

## 2024-05-17 NOTE — Patient Instructions (Signed)
 Visit Information  Thank you for taking time to visit with me today. Please don't hesitate to contact me if I can be of assistance to you before our next scheduled appointment.  Our next appointment is no further scheduled appointments.   Please call the care guide team at [phone] if you need to cancel or reschedule your appointment.    Please call the Suicide and Crisis Lifeline: 988 call the [place]  National Suicide Prevention Lifeline: 6020706364 or TTY: 623-840-8122 TTY 641-082-2735) to talk to a trained counselor call 1-800-273-TALK (toll free, 24 hour hotline) go to [facility] [address] [place] [id number]) call 911 if you are experiencing a Mental Health or Behavioral Health Crisis or need someone to talk to.  Patient verbalizes understanding of instructions and care plan provided today and agrees to view in MyChart. Active MyChart status and patient understanding of how to access instructions and care plan via MyChart confirmed with patient.     Haven Lion, BSW [place]  [facility] Social Worker, [facility] [phone]

## 2024-05-17 NOTE — Patient Outreach (Signed)
 Complex Care Management   Visit Note  05/17/2024  Name:  Emily Bentley MRN: [id number] DOB: 07-26-1935  Situation: Referral received for Complex Care Management related to safety devices I obtained verbal consent from Patient.  Visit completed with patient  on the phone  Background:   Past Medical History:  Diagnosis Date   Allergy    Arthritis    Asthma    Cataract    Chronic allergic rhinitis    with asthmatic component   Diverticulosis of colon (without mention of hemorrhage)    GERD (gastroesophageal reflux disease)    Hearing impairment    Hiatal hernia    History of bursitis    right shoulder   History of cochlear implant    Cant have an MRI   History of colon polyps    History of pneumonia    History of PSVT (paroxysmal supraventricular tachycardia)    Hormone replacement therapy (postmenopausal)    IBS (irritable bowel syndrome)    Peptic stricture of esophagus    Watermelon stomach     Assessment:  BSW outreached patient to assist with SDOH barriers. Social worker did not identify and SDOH barriers. Patient was supplied with a list of resources for medical devices.   Recommendation:   No recommendations at this time.  Follow Up Plan:   Patient has met all care management goals. Care Management case will be closed. Patient has been provided contact information should new needs arise.   Orlean Fey, BSW [place]  [facility] Social Worker, [facility] [phone]

## 2024-05-19 DIAGNOSIS — H26492 Other secondary cataract, left eye: Secondary | ICD-10-CM | POA: Diagnosis not present

## 2024-05-25 DIAGNOSIS — H353131 Nonexudative age-related macular degeneration, bilateral, early dry stage: Secondary | ICD-10-CM | POA: Diagnosis not present

## 2024-05-25 DIAGNOSIS — H26492 Other secondary cataract, left eye: Secondary | ICD-10-CM | POA: Diagnosis not present

## 2024-05-26 ENCOUNTER — Other Ambulatory Visit

## 2024-05-26 ENCOUNTER — Ambulatory Visit
Admission: RE | Admit: 2024-05-26 | Discharge: 2024-05-26 | Disposition: A | Discharge status: Home / Self Care | Source: Ambulatory Visit | Attending: Physician Assistant | Admitting: Physician Assistant

## 2024-05-26 DIAGNOSIS — R413 Other amnesia: Secondary | ICD-10-CM | POA: Diagnosis not present

## 2024-05-29 ENCOUNTER — Encounter: Payer: Self-pay | Admitting: Physician Assistant

## 2024-06-16 DIAGNOSIS — Z01118 Encounter for examination of ears and hearing with other abnormal findings: Secondary | ICD-10-CM | POA: Diagnosis not present

## 2024-06-16 DIAGNOSIS — H903 Sensorineural hearing loss, bilateral: Secondary | ICD-10-CM | POA: Diagnosis not present

## 2024-06-16 DIAGNOSIS — H9313 Tinnitus, bilateral: Secondary | ICD-10-CM | POA: Diagnosis not present

## 2024-06-20 ENCOUNTER — Encounter: Payer: Self-pay | Admitting: Physician Assistant

## 2024-06-20 ENCOUNTER — Encounter

## 2024-07-25 ENCOUNTER — Encounter: Payer: Self-pay | Admitting: Internal Medicine

## 2024-07-26 ENCOUNTER — Other Ambulatory Visit: Payer: Self-pay

## 2024-07-26 MED ORDER — ESCITALOPRAM OXALATE 10 MG PO TABS: 10.0000 mg | ORAL_TABLET | Freq: Every day | ORAL | 1 refills | Status: DC

## 2024-09-04 ENCOUNTER — Other Ambulatory Visit: Payer: Self-pay | Admitting: Internal Medicine

## 2024-09-04 DIAGNOSIS — Z1231 Encounter for screening mammogram for malignant neoplasm of breast: Secondary | ICD-10-CM

## 2024-09-11 ENCOUNTER — Ambulatory Visit (INDEPENDENT_AMBULATORY_CARE_PROVIDER_SITE_OTHER): Admitting: Internal Medicine

## 2024-09-11 VITALS — BP 122/60 | HR 90 | Temp 97.5°F | Ht 63.0 in | Wt 161.0 lb

## 2024-09-11 DIAGNOSIS — F419 Anxiety disorder, unspecified: Secondary | ICD-10-CM | POA: Diagnosis not present

## 2024-09-11 DIAGNOSIS — K219 Gastro-esophageal reflux disease without esophagitis: Secondary | ICD-10-CM

## 2024-09-11 DIAGNOSIS — R413 Other amnesia: Secondary | ICD-10-CM | POA: Diagnosis not present

## 2024-09-11 DIAGNOSIS — M51362 Other intervertebral disc degeneration, lumbar region with discogenic back pain and lower extremity pain: Secondary | ICD-10-CM | POA: Diagnosis not present

## 2024-09-11 MED ORDER — LANSOPRAZOLE 30 MG PO CPDR: 30.0000 mg | DELAYED_RELEASE_CAPSULE | Freq: Every day | ORAL | 3 refills | Status: AC

## 2024-09-11 MED ORDER — ESCITALOPRAM OXALATE 10 MG PO TABS: 10.0000 mg | ORAL_TABLET | Freq: Every day | ORAL | 1 refills | Status: AC

## 2024-09-11 MED ORDER — TRAMADOL HCL 50 MG PO TABS: 25.0000 mg | ORAL_TABLET | Freq: Two times a day (BID) | ORAL | 0 refills | Status: AC | PRN

## 2024-09-11 NOTE — Progress Notes (Signed)
 "  Subjective:   Patient ID: Emily Bentley, female    DOB: November 29, 1934, 89 y.o.   MRN: [id number]  Discussed the use of AI scribe software for clinical note transcription with the patient, who gave verbal consent to proceed.  History of Present Illness Emily Bentley is an 89 year old female who presents with worsening anxiety and memory issues.  She experiences increased anxiety and has started talking to herself. She has been out of her acid reflux medication for two to four months, which she used to take at night to manage symptoms during the day. She continues to take Lexapro  (escitalopram ) daily for anxiety and depression, but only takes half a tramadol  as needed for back pain, which is infrequent. She feels shaky in the mornings until around noon and uses stress tea to help calm herself. She feels overwhelmed by recent events, including the loss of several close people, and finds the news depressing, contributing to her anxiety. Despite these challenges, her sleep remains unaffected, but she feels tired and lacks motivation during the day.  She has a history of low blood pressure, which she managed by stopping a medication that caused itching. She tends to wait until later in the day to eat, but has been using Premier protein drinks in the morning, which were provided by [employer], to help with energy levels.  Regarding her memory concerns, she has seen a neurologist and had a CT scan, which showed no abnormalities. She is concerned about potential Alzheimer's or dementia and wonders if her hearing aids might be contributing to her memory issues. She has seen an audiologist, who confirmed that her hearing aids are functioning but are becoming outdated. She sometimes finds them too loud and has to remove them.  She feels tired and lacks energy, but her sleep is unaffected. She experiences shakiness in the mornings and has low motivation during the day.  Review of Systems  Constitutional:  Negative.   HENT: Negative.    Eyes: Negative.   Respiratory:  Negative for cough, chest tightness and shortness of breath.   Cardiovascular:  Negative for chest pain, palpitations and leg swelling.  Gastrointestinal:  Negative for abdominal distention, abdominal pain, constipation, diarrhea, nausea and vomiting.  Musculoskeletal: Negative.   Skin: Negative.   Neurological: Negative.        Memory changes  Psychiatric/Behavioral:  The patient is nervous/anxious.     Objective:  Physical Exam Constitutional:      Appearance: She is well-developed.  HENT:     Head: Normocephalic and atraumatic.  Cardiovascular:     Rate and Rhythm: Normal rate and regular rhythm.  Pulmonary:     Effort: Pulmonary effort is normal. No respiratory distress.     Breath sounds: Normal breath sounds. No wheezing or rales.  Abdominal:     General: Bowel sounds are normal. There is no distension.     Palpations: Abdomen is soft.     Tenderness: There is no abdominal tenderness.  Musculoskeletal:     Cervical back: Normal range of motion.  Skin:    General: Skin is warm and dry.  Neurological:     Mental Status: She is alert and oriented to person, place, and time.     Coordination: Coordination normal.     Vitals:   09/11/24 1033  BP: 122/60  Pulse: 90  Temp: (!) 97.5 F (36.4 C)  TempSrc: Temporal  SpO2: 99%  Weight: 161 lb (73 kg)  Height: 5' 3 (1.6 m)  Assessment and Plan Assessment & Plan Anxiety and depressive symptoms   Chronic anxiety and depressive symptoms are exacerbated by recent stressors. She takes Lexapro  daily, but its effectiveness is uncertain. Therapy is considered for symptom management and medication evaluation. Continue Lexapro  daily. Refer her to a therapist for counseling in [place]. Discuss therapy options, including in-person and virtual sessions.  Gastroesophageal reflux disease (GERD)   GERD symptoms have worsened due to a recent medication lapse,  causing morning nausea and affecting appetite and energy. Previous medication was effective when taken regularly. Prescribe acid reflux medication. Advise her to eat or drink something in the morning to improve energy levels.  Chronic back pain   Intermittent back pain is managed with tramadol  as needed, with infrequent use indicated by a 2022 prescription. Refill tramadol  prescription for as-needed use.  Memory concerns   Memory concerns persist despite a normal CT scan. Older hearing aids may need upgrading. Further cognitive testing is discussed if concerns persist. Consider further cognitive testing if memory concerns continue. Advise upgrading hearing aids in December.   "

## 2024-09-11 NOTE — Patient Instructions (Signed)
 We have refilled the acid medicine and the tramadol .  We will get you in with a therapist to help

## 2024-09-15 ENCOUNTER — Encounter: Payer: Self-pay | Admitting: Internal Medicine

## 2024-09-15 NOTE — Assessment & Plan Note (Signed)
 Memory concerns persist despite a normal CT scan. Older hearing aids may need upgrading. Further cognitive testing is discussed if concerns persist. Consider further cognitive testing if memory concerns continue. Advise upgrading hearing aids in December.

## 2024-09-15 NOTE — Assessment & Plan Note (Signed)
 Intermittent back pain is managed with tramadol  as needed, with infrequent use indicated by a 2022 prescription. Refill tramadol  prescription for as-needed use.

## 2024-09-15 NOTE — Assessment & Plan Note (Signed)
 GERD symptoms have worsened due to a recent medication lapse, causing morning nausea and affecting appetite and energy. Previous medication was effective when taken regularly. Prescribe acid reflux medication. Advise her to eat or drink something in the morning to improve energy levels.

## 2024-09-15 NOTE — Assessment & Plan Note (Signed)
 Chronic anxiety and depressive symptoms are exacerbated by recent stressors. She takes Lexapro  daily, but its effectiveness is uncertain. Therapy is considered for symptom management and medication evaluation. Continue Lexapro  daily. Refer her to a therapist for counseling in [place]. Discuss therapy options, including in-person and virtual sessions.

## 2024-09-26 ENCOUNTER — Ambulatory Visit
Admission: RE | Admit: 2024-09-26 | Discharge: 2024-09-26 | Disposition: A | Source: Ambulatory Visit | Attending: Internal Medicine | Admitting: Internal Medicine

## 2024-09-26 DIAGNOSIS — Z1231 Encounter for screening mammogram for malignant neoplasm of breast: Secondary | ICD-10-CM | POA: Diagnosis not present

## 2024-09-29 ENCOUNTER — Ambulatory Visit: Payer: Self-pay | Admitting: Internal Medicine

## 2024-11-08 ENCOUNTER — Ambulatory Visit: Admitting: Physician Assistant

## 2024-12-05 ENCOUNTER — Ambulatory Visit: Admitting: Physician Assistant

## 2025-01-03 ENCOUNTER — Ambulatory Visit: Admitting: Physician Assistant

## 2025-05-18 ENCOUNTER — Ambulatory Visit
